# Patient Record
Sex: Male | Born: 1950 | Race: Black or African American | Hispanic: No | Marital: Married | Smoking: Former smoker
Health system: Southern US, Community
[De-identification: ages and names within clinical notes are randomized; demographics above are authoritative.]

## PROBLEM LIST (undated history)

## (undated) DIAGNOSIS — G959 Disease of spinal cord, unspecified: Secondary | ICD-10-CM

## (undated) DIAGNOSIS — I2510 Atherosclerotic heart disease of native coronary artery without angina pectoris: Secondary | ICD-10-CM

## (undated) DIAGNOSIS — M1990 Unspecified osteoarthritis, unspecified site: Secondary | ICD-10-CM

## (undated) DIAGNOSIS — E785 Hyperlipidemia, unspecified: Secondary | ICD-10-CM

## (undated) DIAGNOSIS — I10 Essential (primary) hypertension: Secondary | ICD-10-CM

## (undated) DIAGNOSIS — R001 Bradycardia, unspecified: Secondary | ICD-10-CM

## (undated) HISTORY — DX: Essential (primary) hypertension: I10

## (undated) HISTORY — PX: APPENDECTOMY: SHX54

## (undated) HISTORY — PX: ROTATOR CUFF REPAIR: SHX139

## (undated) HISTORY — DX: Hyperlipidemia, unspecified: E78.5

## (undated) HISTORY — DX: Unspecified osteoarthritis, unspecified site: M19.90

---

## 2015-01-04 ENCOUNTER — Ambulatory Visit (INDEPENDENT_AMBULATORY_CARE_PROVIDER_SITE_OTHER): Payer: Medicare Other | Admitting: Family Medicine

## 2015-01-04 ENCOUNTER — Ambulatory Visit (INDEPENDENT_AMBULATORY_CARE_PROVIDER_SITE_OTHER): Payer: Medicare Other

## 2015-01-04 VITALS — BP 120/76 | HR 56 | Temp 98.3°F | Resp 14 | Ht 71.0 in | Wt 190.0 lb

## 2015-01-04 DIAGNOSIS — R05 Cough: Secondary | ICD-10-CM

## 2015-01-04 DIAGNOSIS — R059 Cough, unspecified: Secondary | ICD-10-CM

## 2015-01-04 NOTE — Progress Notes (Signed)
[facility] [address] [place] [address] [phone]  Date:  01/04/2015   Name:  Kevin Wade   DOB:  [birth year]   MRN:  [id number]  PCP:  Pcp Not In System    Chief Complaint: Cough   History of Present Illness:  Kevin Wade is a 64 y.o. very pleasant male patient who presents with the following: Cough:  - 1 month, getting better.  Not really bothering him, but wants to make sure everything is okay.  - Coughing up green sputum - Denies fevers/chills. No weight loss.   - Tried Airborn, theraflu, garlic - No allergies - No rhinorrea or other URI symptoms. Good appetitie.  - Not keeping him up at night - No sick contacts - Previous smoker: last smoked > 30 yrs ago, ppd x 20, 20 pk years   There are no active problems to display for this patient.   Past Medical History  Diagnosis Date  . Arthritis   . Hypertension   . Hyperlipidemia     Past Surgical History  Procedure Laterality Date  . Appendectomy      Social History  Substance Use Topics  . Smoking status: Never Smoker   . Smokeless tobacco: None  . Alcohol Use: No    Family History  Problem Relation Age of Onset  . Hypertension Father     No Known Allergies  Medication list has been reviewed and updated.  No current outpatient prescriptions on file prior to visit.   No current facility-administered medications on file prior to visit.    Review of Systems: Review of Systems  Constitutional: Negative for fever, chills, weight loss and malaise/fatigue.  HENT: Negative for congestion, ear pain and nosebleeds.   Eyes: Negative for blurred vision.  Respiratory: Positive for cough (wet). Negative for hemoptysis, sputum production, shortness of breath, wheezing and stridor.   Cardiovascular: Negative for chest pain and palpitations.  Gastrointestinal: Negative for heartburn, nausea, vomiting, abdominal pain, diarrhea and constipation.  Musculoskeletal: Negative for  myalgias.  Neurological: Negative for dizziness, focal weakness, weakness and headaches.  Endo/Heme/Allergies: Negative for environmental allergies.     Physical Examination: Filed Vitals:   01/04/15 1638  BP: 120/76  Pulse: 56  Temp: 98.3 F (36.8 C)  Resp: 14   Filed Vitals:   01/04/15 1638  Height:  (1.803 m)  Weight: 190 lb (86.183 kg)   Body mass index is 26.51 kg/(m^2). Ideal Body Weight: Weight in (lb) to have BMI = 25: 178.9  GEN: WDWN, NAD, Non-toxic, A & O x 3 HEENT: Atraumatic, Normocephalic. Neck supple. No masses, No LAD. Ears and Nose: No external deformity. CV: RRR, No M/G/R. No JVD. No thrill. No extra heart sounds. PULM: CTA B, no wheezes, crackles, rhonchi. No retractions. No resp. distress. No accessory muscle use. ABD: S, NT, ND, +BS. No rebound. No HSM. EXTR: No c/c/e NEURO Normal gait.  PSYCH: Normally interactive. Conversant. Not depressed or anxious appearing.  Calm demeanor.   [facility] reading (PRIMARY) by  Dr. Neva Seat and Mayford Knife: CXR: increased perihilar markings, otherwise unremarkable.     Assessment and Plan: 1 month history of mild, improving cough in a 64 yo former smoker (20pckyr, <30 years ago).  No systemic symptoms.  CXR negative other then hilar LAD.  Symptoms are not bothersome.  Will f/u in 1 month if cough still present.    Signed Guinevere Scarlet, MD

## 2015-01-04 NOTE — Patient Instructions (Signed)
Cough, Adult  A cough is a reflex that helps clear your throat and airways. It can help heal the body or may be a reaction to an irritated airway. A cough may only last 2 or 3 weeks (acute) or may last more than 8 weeks (chronic).  CAUSES Acute cough:  Viral or bacterial infections. Chronic cough:  Infections.  Allergies.  Asthma.  Post-nasal drip.  Smoking.  Heartburn or acid reflux.  Some medicines.  Chronic lung problems (COPD).  Cancer. SYMPTOMS   Cough.  Fever.  Chest pain.  Increased breathing rate.  High-pitched whistling sound when breathing (wheezing).  Colored mucus that you cough up (sputum). TREATMENT   A bacterial cough may be treated with antibiotic medicine.  A viral cough must run its course and will not respond to antibiotics.  Your caregiver may recommend other treatments if you have a chronic cough. HOME CARE INSTRUCTIONS   Only take over-the-counter or prescription medicines for pain, discomfort, or fever as directed by your caregiver. Use cough suppressants only as directed by your caregiver.  Use a cold steam vaporizer or humidifier in your bedroom or home to help loosen secretions.  Sleep in a semi-upright position if your cough is worse at night.  Rest as needed.  Stop smoking if you smoke. SEEK IMMEDIATE MEDICAL CARE IF:   You have pus in your sputum.  Your cough starts to worsen.  You cannot control your cough with suppressants and are losing sleep.  You begin coughing up blood.  You have difficulty breathing.  You develop pain which is getting worse or is uncontrolled with medicine.  You have a fever. MAKE SURE YOU:   Understand these instructions.  Will watch your condition.  Will get help right away if you are not doing well or get worse. Document Released: 10/27/2010 Document Revised: 07/23/2011 Document Reviewed: 10/27/2010 ExitCare Patient Information 2015 [employer]. This information is not intended  to replace advice given to you by your health care provider. Make sure you discuss any questions you have with your health care provider.  

## 2015-01-04 NOTE — Progress Notes (Signed)
Xray read and patient discussed with Dr. Williams. Agree with assessment and plan of care per his note.   

## 2015-12-02 DIAGNOSIS — M7062 Trochanteric bursitis, left hip: Secondary | ICD-10-CM | POA: Diagnosis not present

## 2015-12-06 DIAGNOSIS — M25552 Pain in left hip: Secondary | ICD-10-CM | POA: Diagnosis not present

## 2015-12-06 DIAGNOSIS — M79652 Pain in left thigh: Secondary | ICD-10-CM | POA: Diagnosis not present

## 2015-12-09 DIAGNOSIS — M5416 Radiculopathy, lumbar region: Secondary | ICD-10-CM | POA: Diagnosis not present

## 2015-12-19 DIAGNOSIS — M85511 Aneurysmal bone cyst, right shoulder: Secondary | ICD-10-CM | POA: Diagnosis not present

## 2015-12-19 DIAGNOSIS — M5416 Radiculopathy, lumbar region: Secondary | ICD-10-CM | POA: Diagnosis not present

## 2015-12-21 DIAGNOSIS — M5416 Radiculopathy, lumbar region: Secondary | ICD-10-CM | POA: Diagnosis not present

## 2015-12-27 DIAGNOSIS — M5416 Radiculopathy, lumbar region: Secondary | ICD-10-CM | POA: Diagnosis not present

## 2016-01-09 DIAGNOSIS — M25511 Pain in right shoulder: Secondary | ICD-10-CM | POA: Diagnosis not present

## 2016-01-09 DIAGNOSIS — M5416 Radiculopathy, lumbar region: Secondary | ICD-10-CM | POA: Diagnosis not present

## 2016-01-13 DIAGNOSIS — M5416 Radiculopathy, lumbar region: Secondary | ICD-10-CM | POA: Diagnosis not present

## 2016-01-14 DIAGNOSIS — M25511 Pain in right shoulder: Secondary | ICD-10-CM | POA: Diagnosis not present

## 2016-01-18 DIAGNOSIS — M25511 Pain in right shoulder: Secondary | ICD-10-CM | POA: Diagnosis not present

## 2016-01-20 DIAGNOSIS — M7521 Bicipital tendinitis, right shoulder: Secondary | ICD-10-CM | POA: Diagnosis not present

## 2016-01-24 DIAGNOSIS — M7541 Impingement syndrome of right shoulder: Secondary | ICD-10-CM | POA: Diagnosis not present

## 2016-01-24 DIAGNOSIS — M75121 Complete rotator cuff tear or rupture of right shoulder, not specified as traumatic: Secondary | ICD-10-CM | POA: Diagnosis not present

## 2016-01-24 DIAGNOSIS — G8918 Other acute postprocedural pain: Secondary | ICD-10-CM | POA: Diagnosis not present

## 2016-01-24 DIAGNOSIS — M7521 Bicipital tendinitis, right shoulder: Secondary | ICD-10-CM | POA: Diagnosis not present

## 2016-02-01 DIAGNOSIS — Z9889 Other specified postprocedural states: Secondary | ICD-10-CM | POA: Diagnosis not present

## 2016-02-10 DIAGNOSIS — Z9889 Other specified postprocedural states: Secondary | ICD-10-CM | POA: Diagnosis not present

## 2016-03-07 DIAGNOSIS — Z9889 Other specified postprocedural states: Secondary | ICD-10-CM | POA: Diagnosis not present

## 2016-03-12 DIAGNOSIS — M25611 Stiffness of right shoulder, not elsewhere classified: Secondary | ICD-10-CM | POA: Diagnosis not present

## 2016-03-12 DIAGNOSIS — M25511 Pain in right shoulder: Secondary | ICD-10-CM | POA: Diagnosis not present

## 2016-03-12 DIAGNOSIS — M75121 Complete rotator cuff tear or rupture of right shoulder, not specified as traumatic: Secondary | ICD-10-CM | POA: Diagnosis not present

## 2016-03-13 DIAGNOSIS — I10 Essential (primary) hypertension: Secondary | ICD-10-CM | POA: Diagnosis not present

## 2016-03-13 DIAGNOSIS — Z9889 Other specified postprocedural states: Secondary | ICD-10-CM | POA: Insufficient documentation

## 2016-03-13 DIAGNOSIS — E785 Hyperlipidemia, unspecified: Secondary | ICD-10-CM | POA: Diagnosis not present

## 2016-03-13 DIAGNOSIS — Z23 Encounter for immunization: Secondary | ICD-10-CM | POA: Diagnosis not present

## 2016-03-14 DIAGNOSIS — M25511 Pain in right shoulder: Secondary | ICD-10-CM | POA: Diagnosis not present

## 2016-03-14 DIAGNOSIS — M75121 Complete rotator cuff tear or rupture of right shoulder, not specified as traumatic: Secondary | ICD-10-CM | POA: Diagnosis not present

## 2016-03-14 DIAGNOSIS — M25611 Stiffness of right shoulder, not elsewhere classified: Secondary | ICD-10-CM | POA: Diagnosis not present

## 2016-03-16 DIAGNOSIS — M25611 Stiffness of right shoulder, not elsewhere classified: Secondary | ICD-10-CM | POA: Diagnosis not present

## 2016-03-16 DIAGNOSIS — M75121 Complete rotator cuff tear or rupture of right shoulder, not specified as traumatic: Secondary | ICD-10-CM | POA: Diagnosis not present

## 2016-03-16 DIAGNOSIS — M25511 Pain in right shoulder: Secondary | ICD-10-CM | POA: Diagnosis not present

## 2016-03-19 DIAGNOSIS — M75121 Complete rotator cuff tear or rupture of right shoulder, not specified as traumatic: Secondary | ICD-10-CM | POA: Diagnosis not present

## 2016-03-19 DIAGNOSIS — M25611 Stiffness of right shoulder, not elsewhere classified: Secondary | ICD-10-CM | POA: Diagnosis not present

## 2016-03-19 DIAGNOSIS — M25511 Pain in right shoulder: Secondary | ICD-10-CM | POA: Diagnosis not present

## 2016-03-21 DIAGNOSIS — M25511 Pain in right shoulder: Secondary | ICD-10-CM | POA: Diagnosis not present

## 2016-03-21 DIAGNOSIS — M75121 Complete rotator cuff tear or rupture of right shoulder, not specified as traumatic: Secondary | ICD-10-CM | POA: Diagnosis not present

## 2016-03-21 DIAGNOSIS — M25611 Stiffness of right shoulder, not elsewhere classified: Secondary | ICD-10-CM | POA: Diagnosis not present

## 2016-03-23 DIAGNOSIS — M25511 Pain in right shoulder: Secondary | ICD-10-CM | POA: Diagnosis not present

## 2016-03-23 DIAGNOSIS — M75121 Complete rotator cuff tear or rupture of right shoulder, not specified as traumatic: Secondary | ICD-10-CM | POA: Diagnosis not present

## 2016-03-23 DIAGNOSIS — M25611 Stiffness of right shoulder, not elsewhere classified: Secondary | ICD-10-CM | POA: Diagnosis not present

## 2016-03-26 DIAGNOSIS — M75121 Complete rotator cuff tear or rupture of right shoulder, not specified as traumatic: Secondary | ICD-10-CM | POA: Diagnosis not present

## 2016-03-26 DIAGNOSIS — M25511 Pain in right shoulder: Secondary | ICD-10-CM | POA: Diagnosis not present

## 2016-03-26 DIAGNOSIS — M25611 Stiffness of right shoulder, not elsewhere classified: Secondary | ICD-10-CM | POA: Diagnosis not present

## 2016-03-28 DIAGNOSIS — M25511 Pain in right shoulder: Secondary | ICD-10-CM | POA: Diagnosis not present

## 2016-03-28 DIAGNOSIS — M75121 Complete rotator cuff tear or rupture of right shoulder, not specified as traumatic: Secondary | ICD-10-CM | POA: Diagnosis not present

## 2016-03-28 DIAGNOSIS — M25611 Stiffness of right shoulder, not elsewhere classified: Secondary | ICD-10-CM | POA: Diagnosis not present

## 2016-04-02 DIAGNOSIS — M25511 Pain in right shoulder: Secondary | ICD-10-CM | POA: Diagnosis not present

## 2016-04-02 DIAGNOSIS — M75121 Complete rotator cuff tear or rupture of right shoulder, not specified as traumatic: Secondary | ICD-10-CM | POA: Diagnosis not present

## 2016-04-02 DIAGNOSIS — M25611 Stiffness of right shoulder, not elsewhere classified: Secondary | ICD-10-CM | POA: Diagnosis not present

## 2016-04-03 DIAGNOSIS — M25511 Pain in right shoulder: Secondary | ICD-10-CM | POA: Diagnosis not present

## 2016-04-03 DIAGNOSIS — M25611 Stiffness of right shoulder, not elsewhere classified: Secondary | ICD-10-CM | POA: Diagnosis not present

## 2016-04-03 DIAGNOSIS — M75121 Complete rotator cuff tear or rupture of right shoulder, not specified as traumatic: Secondary | ICD-10-CM | POA: Diagnosis not present

## 2016-04-10 DIAGNOSIS — Z0000 Encounter for general adult medical examination without abnormal findings: Secondary | ICD-10-CM | POA: Diagnosis not present

## 2016-04-11 DIAGNOSIS — M25611 Stiffness of right shoulder, not elsewhere classified: Secondary | ICD-10-CM | POA: Diagnosis not present

## 2016-04-11 DIAGNOSIS — M25511 Pain in right shoulder: Secondary | ICD-10-CM | POA: Diagnosis not present

## 2016-04-11 DIAGNOSIS — M75121 Complete rotator cuff tear or rupture of right shoulder, not specified as traumatic: Secondary | ICD-10-CM | POA: Diagnosis not present

## 2016-04-13 DIAGNOSIS — M75121 Complete rotator cuff tear or rupture of right shoulder, not specified as traumatic: Secondary | ICD-10-CM | POA: Diagnosis not present

## 2016-04-13 DIAGNOSIS — M25511 Pain in right shoulder: Secondary | ICD-10-CM | POA: Diagnosis not present

## 2016-04-13 DIAGNOSIS — M25611 Stiffness of right shoulder, not elsewhere classified: Secondary | ICD-10-CM | POA: Diagnosis not present

## 2016-04-16 DIAGNOSIS — M75121 Complete rotator cuff tear or rupture of right shoulder, not specified as traumatic: Secondary | ICD-10-CM | POA: Diagnosis not present

## 2016-04-16 DIAGNOSIS — M25611 Stiffness of right shoulder, not elsewhere classified: Secondary | ICD-10-CM | POA: Diagnosis not present

## 2016-04-16 DIAGNOSIS — M25511 Pain in right shoulder: Secondary | ICD-10-CM | POA: Diagnosis not present

## 2016-04-18 DIAGNOSIS — Z9889 Other specified postprocedural states: Secondary | ICD-10-CM | POA: Diagnosis not present

## 2016-04-19 DIAGNOSIS — M25511 Pain in right shoulder: Secondary | ICD-10-CM | POA: Diagnosis not present

## 2016-04-19 DIAGNOSIS — M25611 Stiffness of right shoulder, not elsewhere classified: Secondary | ICD-10-CM | POA: Diagnosis not present

## 2016-04-19 DIAGNOSIS — M75121 Complete rotator cuff tear or rupture of right shoulder, not specified as traumatic: Secondary | ICD-10-CM | POA: Diagnosis not present

## 2016-04-24 DIAGNOSIS — M75121 Complete rotator cuff tear or rupture of right shoulder, not specified as traumatic: Secondary | ICD-10-CM | POA: Diagnosis not present

## 2016-04-24 DIAGNOSIS — M25611 Stiffness of right shoulder, not elsewhere classified: Secondary | ICD-10-CM | POA: Diagnosis not present

## 2016-04-24 DIAGNOSIS — M25511 Pain in right shoulder: Secondary | ICD-10-CM | POA: Diagnosis not present

## 2016-04-27 DIAGNOSIS — M25611 Stiffness of right shoulder, not elsewhere classified: Secondary | ICD-10-CM | POA: Diagnosis not present

## 2016-04-27 DIAGNOSIS — M75121 Complete rotator cuff tear or rupture of right shoulder, not specified as traumatic: Secondary | ICD-10-CM | POA: Diagnosis not present

## 2016-04-27 DIAGNOSIS — M25511 Pain in right shoulder: Secondary | ICD-10-CM | POA: Diagnosis not present

## 2016-04-30 DIAGNOSIS — M25511 Pain in right shoulder: Secondary | ICD-10-CM | POA: Diagnosis not present

## 2016-04-30 DIAGNOSIS — M25611 Stiffness of right shoulder, not elsewhere classified: Secondary | ICD-10-CM | POA: Diagnosis not present

## 2016-04-30 DIAGNOSIS — M75121 Complete rotator cuff tear or rupture of right shoulder, not specified as traumatic: Secondary | ICD-10-CM | POA: Diagnosis not present

## 2016-05-02 DIAGNOSIS — M25511 Pain in right shoulder: Secondary | ICD-10-CM | POA: Diagnosis not present

## 2016-05-02 DIAGNOSIS — M75121 Complete rotator cuff tear or rupture of right shoulder, not specified as traumatic: Secondary | ICD-10-CM | POA: Diagnosis not present

## 2016-05-02 DIAGNOSIS — M25611 Stiffness of right shoulder, not elsewhere classified: Secondary | ICD-10-CM | POA: Diagnosis not present

## 2016-05-15 DIAGNOSIS — M25611 Stiffness of right shoulder, not elsewhere classified: Secondary | ICD-10-CM | POA: Diagnosis not present

## 2016-05-15 DIAGNOSIS — M25511 Pain in right shoulder: Secondary | ICD-10-CM | POA: Diagnosis not present

## 2016-05-15 DIAGNOSIS — M75121 Complete rotator cuff tear or rupture of right shoulder, not specified as traumatic: Secondary | ICD-10-CM | POA: Diagnosis not present

## 2016-05-17 DIAGNOSIS — M25611 Stiffness of right shoulder, not elsewhere classified: Secondary | ICD-10-CM | POA: Diagnosis not present

## 2016-05-17 DIAGNOSIS — M25511 Pain in right shoulder: Secondary | ICD-10-CM | POA: Diagnosis not present

## 2016-05-17 DIAGNOSIS — M75121 Complete rotator cuff tear or rupture of right shoulder, not specified as traumatic: Secondary | ICD-10-CM | POA: Diagnosis not present

## 2016-05-21 DIAGNOSIS — M25611 Stiffness of right shoulder, not elsewhere classified: Secondary | ICD-10-CM | POA: Diagnosis not present

## 2016-05-21 DIAGNOSIS — M75121 Complete rotator cuff tear or rupture of right shoulder, not specified as traumatic: Secondary | ICD-10-CM | POA: Diagnosis not present

## 2016-05-21 DIAGNOSIS — M25511 Pain in right shoulder: Secondary | ICD-10-CM | POA: Diagnosis not present

## 2016-05-23 DIAGNOSIS — M25511 Pain in right shoulder: Secondary | ICD-10-CM | POA: Diagnosis not present

## 2016-05-23 DIAGNOSIS — M25611 Stiffness of right shoulder, not elsewhere classified: Secondary | ICD-10-CM | POA: Diagnosis not present

## 2016-05-23 DIAGNOSIS — M75121 Complete rotator cuff tear or rupture of right shoulder, not specified as traumatic: Secondary | ICD-10-CM | POA: Diagnosis not present

## 2016-05-28 DIAGNOSIS — M75121 Complete rotator cuff tear or rupture of right shoulder, not specified as traumatic: Secondary | ICD-10-CM | POA: Diagnosis not present

## 2016-05-28 DIAGNOSIS — M25611 Stiffness of right shoulder, not elsewhere classified: Secondary | ICD-10-CM | POA: Diagnosis not present

## 2016-05-28 DIAGNOSIS — M25511 Pain in right shoulder: Secondary | ICD-10-CM | POA: Diagnosis not present

## 2016-06-01 DIAGNOSIS — Z09 Encounter for follow-up examination after completed treatment for conditions other than malignant neoplasm: Secondary | ICD-10-CM | POA: Diagnosis not present

## 2016-06-04 DIAGNOSIS — M75121 Complete rotator cuff tear or rupture of right shoulder, not specified as traumatic: Secondary | ICD-10-CM | POA: Diagnosis not present

## 2016-06-04 DIAGNOSIS — M25511 Pain in right shoulder: Secondary | ICD-10-CM | POA: Diagnosis not present

## 2016-06-04 DIAGNOSIS — M25611 Stiffness of right shoulder, not elsewhere classified: Secondary | ICD-10-CM | POA: Diagnosis not present

## 2016-06-06 DIAGNOSIS — M25511 Pain in right shoulder: Secondary | ICD-10-CM | POA: Diagnosis not present

## 2016-06-06 DIAGNOSIS — M25611 Stiffness of right shoulder, not elsewhere classified: Secondary | ICD-10-CM | POA: Diagnosis not present

## 2016-06-06 DIAGNOSIS — M75121 Complete rotator cuff tear or rupture of right shoulder, not specified as traumatic: Secondary | ICD-10-CM | POA: Diagnosis not present

## 2016-06-11 DIAGNOSIS — M75121 Complete rotator cuff tear or rupture of right shoulder, not specified as traumatic: Secondary | ICD-10-CM | POA: Diagnosis not present

## 2016-06-11 DIAGNOSIS — M25511 Pain in right shoulder: Secondary | ICD-10-CM | POA: Diagnosis not present

## 2016-06-11 DIAGNOSIS — I10 Essential (primary) hypertension: Secondary | ICD-10-CM | POA: Diagnosis not present

## 2016-06-11 DIAGNOSIS — Z1159 Encounter for screening for other viral diseases: Secondary | ICD-10-CM | POA: Diagnosis not present

## 2016-06-11 DIAGNOSIS — Z0000 Encounter for general adult medical examination without abnormal findings: Secondary | ICD-10-CM | POA: Diagnosis not present

## 2016-06-11 DIAGNOSIS — M25611 Stiffness of right shoulder, not elsewhere classified: Secondary | ICD-10-CM | POA: Diagnosis not present

## 2016-06-11 DIAGNOSIS — E785 Hyperlipidemia, unspecified: Secondary | ICD-10-CM | POA: Diagnosis not present

## 2016-06-13 DIAGNOSIS — I10 Essential (primary) hypertension: Secondary | ICD-10-CM | POA: Diagnosis not present

## 2016-06-13 DIAGNOSIS — H6121 Impacted cerumen, right ear: Secondary | ICD-10-CM | POA: Diagnosis not present

## 2016-06-13 DIAGNOSIS — M25611 Stiffness of right shoulder, not elsewhere classified: Secondary | ICD-10-CM | POA: Diagnosis not present

## 2016-06-13 DIAGNOSIS — M75121 Complete rotator cuff tear or rupture of right shoulder, not specified as traumatic: Secondary | ICD-10-CM | POA: Diagnosis not present

## 2016-06-13 DIAGNOSIS — Z1211 Encounter for screening for malignant neoplasm of colon: Secondary | ICD-10-CM | POA: Diagnosis not present

## 2016-06-13 DIAGNOSIS — M25511 Pain in right shoulder: Secondary | ICD-10-CM | POA: Diagnosis not present

## 2016-06-13 DIAGNOSIS — Z23 Encounter for immunization: Secondary | ICD-10-CM | POA: Diagnosis not present

## 2016-06-13 DIAGNOSIS — E785 Hyperlipidemia, unspecified: Secondary | ICD-10-CM | POA: Diagnosis not present

## 2016-06-15 DIAGNOSIS — K5900 Constipation, unspecified: Secondary | ICD-10-CM | POA: Diagnosis not present

## 2016-06-15 DIAGNOSIS — Z1211 Encounter for screening for malignant neoplasm of colon: Secondary | ICD-10-CM | POA: Diagnosis not present

## 2016-06-15 DIAGNOSIS — Z86010 Personal history of colonic polyps: Secondary | ICD-10-CM | POA: Diagnosis not present

## 2016-06-18 DIAGNOSIS — M75121 Complete rotator cuff tear or rupture of right shoulder, not specified as traumatic: Secondary | ICD-10-CM | POA: Diagnosis not present

## 2016-06-18 DIAGNOSIS — M25611 Stiffness of right shoulder, not elsewhere classified: Secondary | ICD-10-CM | POA: Diagnosis not present

## 2016-06-18 DIAGNOSIS — M25511 Pain in right shoulder: Secondary | ICD-10-CM | POA: Diagnosis not present

## 2016-06-20 DIAGNOSIS — M25611 Stiffness of right shoulder, not elsewhere classified: Secondary | ICD-10-CM | POA: Diagnosis not present

## 2016-06-20 DIAGNOSIS — M25511 Pain in right shoulder: Secondary | ICD-10-CM | POA: Diagnosis not present

## 2016-06-20 DIAGNOSIS — M75121 Complete rotator cuff tear or rupture of right shoulder, not specified as traumatic: Secondary | ICD-10-CM | POA: Diagnosis not present

## 2016-06-25 DIAGNOSIS — M25511 Pain in right shoulder: Secondary | ICD-10-CM | POA: Diagnosis not present

## 2016-06-25 DIAGNOSIS — M25611 Stiffness of right shoulder, not elsewhere classified: Secondary | ICD-10-CM | POA: Diagnosis not present

## 2016-06-25 DIAGNOSIS — M75121 Complete rotator cuff tear or rupture of right shoulder, not specified as traumatic: Secondary | ICD-10-CM | POA: Diagnosis not present

## 2016-06-27 DIAGNOSIS — M75121 Complete rotator cuff tear or rupture of right shoulder, not specified as traumatic: Secondary | ICD-10-CM | POA: Diagnosis not present

## 2016-06-27 DIAGNOSIS — M25511 Pain in right shoulder: Secondary | ICD-10-CM | POA: Diagnosis not present

## 2016-06-27 DIAGNOSIS — M25611 Stiffness of right shoulder, not elsewhere classified: Secondary | ICD-10-CM | POA: Diagnosis not present

## 2016-07-04 DIAGNOSIS — M25511 Pain in right shoulder: Secondary | ICD-10-CM | POA: Diagnosis not present

## 2016-07-04 DIAGNOSIS — M25611 Stiffness of right shoulder, not elsewhere classified: Secondary | ICD-10-CM | POA: Diagnosis not present

## 2016-07-04 DIAGNOSIS — M75121 Complete rotator cuff tear or rupture of right shoulder, not specified as traumatic: Secondary | ICD-10-CM | POA: Diagnosis not present

## 2016-07-09 DIAGNOSIS — M25611 Stiffness of right shoulder, not elsewhere classified: Secondary | ICD-10-CM | POA: Diagnosis not present

## 2016-07-09 DIAGNOSIS — M25511 Pain in right shoulder: Secondary | ICD-10-CM | POA: Diagnosis not present

## 2016-07-09 DIAGNOSIS — M75121 Complete rotator cuff tear or rupture of right shoulder, not specified as traumatic: Secondary | ICD-10-CM | POA: Diagnosis not present

## 2016-07-12 DIAGNOSIS — M25611 Stiffness of right shoulder, not elsewhere classified: Secondary | ICD-10-CM | POA: Diagnosis not present

## 2016-07-12 DIAGNOSIS — M75121 Complete rotator cuff tear or rupture of right shoulder, not specified as traumatic: Secondary | ICD-10-CM | POA: Diagnosis not present

## 2016-07-12 DIAGNOSIS — M25511 Pain in right shoulder: Secondary | ICD-10-CM | POA: Diagnosis not present

## 2016-07-13 DIAGNOSIS — Z09 Encounter for follow-up examination after completed treatment for conditions other than malignant neoplasm: Secondary | ICD-10-CM | POA: Diagnosis not present

## 2016-07-16 DIAGNOSIS — M25511 Pain in right shoulder: Secondary | ICD-10-CM | POA: Diagnosis not present

## 2016-07-16 DIAGNOSIS — M25611 Stiffness of right shoulder, not elsewhere classified: Secondary | ICD-10-CM | POA: Diagnosis not present

## 2016-07-16 DIAGNOSIS — M75121 Complete rotator cuff tear or rupture of right shoulder, not specified as traumatic: Secondary | ICD-10-CM | POA: Diagnosis not present

## 2016-07-25 DIAGNOSIS — M75121 Complete rotator cuff tear or rupture of right shoulder, not specified as traumatic: Secondary | ICD-10-CM | POA: Diagnosis not present

## 2016-07-25 DIAGNOSIS — M25511 Pain in right shoulder: Secondary | ICD-10-CM | POA: Diagnosis not present

## 2016-07-25 DIAGNOSIS — M25611 Stiffness of right shoulder, not elsewhere classified: Secondary | ICD-10-CM | POA: Diagnosis not present

## 2016-07-31 DIAGNOSIS — M25511 Pain in right shoulder: Secondary | ICD-10-CM | POA: Diagnosis not present

## 2016-07-31 DIAGNOSIS — M25611 Stiffness of right shoulder, not elsewhere classified: Secondary | ICD-10-CM | POA: Diagnosis not present

## 2016-07-31 DIAGNOSIS — M75121 Complete rotator cuff tear or rupture of right shoulder, not specified as traumatic: Secondary | ICD-10-CM | POA: Diagnosis not present

## 2016-08-07 DIAGNOSIS — M25611 Stiffness of right shoulder, not elsewhere classified: Secondary | ICD-10-CM | POA: Diagnosis not present

## 2016-08-07 DIAGNOSIS — M75121 Complete rotator cuff tear or rupture of right shoulder, not specified as traumatic: Secondary | ICD-10-CM | POA: Diagnosis not present

## 2016-08-07 DIAGNOSIS — M25511 Pain in right shoulder: Secondary | ICD-10-CM | POA: Diagnosis not present

## 2016-08-13 DIAGNOSIS — M25611 Stiffness of right shoulder, not elsewhere classified: Secondary | ICD-10-CM | POA: Diagnosis not present

## 2016-08-13 DIAGNOSIS — M25511 Pain in right shoulder: Secondary | ICD-10-CM | POA: Diagnosis not present

## 2016-08-13 DIAGNOSIS — M75121 Complete rotator cuff tear or rupture of right shoulder, not specified as traumatic: Secondary | ICD-10-CM | POA: Diagnosis not present

## 2016-08-15 DIAGNOSIS — M25611 Stiffness of right shoulder, not elsewhere classified: Secondary | ICD-10-CM | POA: Diagnosis not present

## 2016-08-15 DIAGNOSIS — M25511 Pain in right shoulder: Secondary | ICD-10-CM | POA: Diagnosis not present

## 2016-08-15 DIAGNOSIS — M75121 Complete rotator cuff tear or rupture of right shoulder, not specified as traumatic: Secondary | ICD-10-CM | POA: Diagnosis not present

## 2016-08-20 DIAGNOSIS — M75121 Complete rotator cuff tear or rupture of right shoulder, not specified as traumatic: Secondary | ICD-10-CM | POA: Diagnosis not present

## 2016-08-20 DIAGNOSIS — M25611 Stiffness of right shoulder, not elsewhere classified: Secondary | ICD-10-CM | POA: Diagnosis not present

## 2016-08-20 DIAGNOSIS — M25511 Pain in right shoulder: Secondary | ICD-10-CM | POA: Diagnosis not present

## 2016-08-22 DIAGNOSIS — M75121 Complete rotator cuff tear or rupture of right shoulder, not specified as traumatic: Secondary | ICD-10-CM | POA: Diagnosis not present

## 2016-08-22 DIAGNOSIS — M25511 Pain in right shoulder: Secondary | ICD-10-CM | POA: Diagnosis not present

## 2016-08-22 DIAGNOSIS — M25611 Stiffness of right shoulder, not elsewhere classified: Secondary | ICD-10-CM | POA: Diagnosis not present

## 2016-08-27 DIAGNOSIS — M25611 Stiffness of right shoulder, not elsewhere classified: Secondary | ICD-10-CM | POA: Diagnosis not present

## 2016-08-27 DIAGNOSIS — M25511 Pain in right shoulder: Secondary | ICD-10-CM | POA: Diagnosis not present

## 2016-08-27 DIAGNOSIS — M75121 Complete rotator cuff tear or rupture of right shoulder, not specified as traumatic: Secondary | ICD-10-CM | POA: Diagnosis not present

## 2016-08-29 DIAGNOSIS — M25611 Stiffness of right shoulder, not elsewhere classified: Secondary | ICD-10-CM | POA: Diagnosis not present

## 2016-08-29 DIAGNOSIS — M75121 Complete rotator cuff tear or rupture of right shoulder, not specified as traumatic: Secondary | ICD-10-CM | POA: Diagnosis not present

## 2016-08-29 DIAGNOSIS — M25511 Pain in right shoulder: Secondary | ICD-10-CM | POA: Diagnosis not present

## 2016-09-30 IMAGING — CR DG CHEST 2V
3 series · 3 of 3 positions shown · non-contrast
Comparison: None.

CLINICAL DATA: Cough.

EXAM:
CHEST  2 VIEW

[PA (1 of 2)]
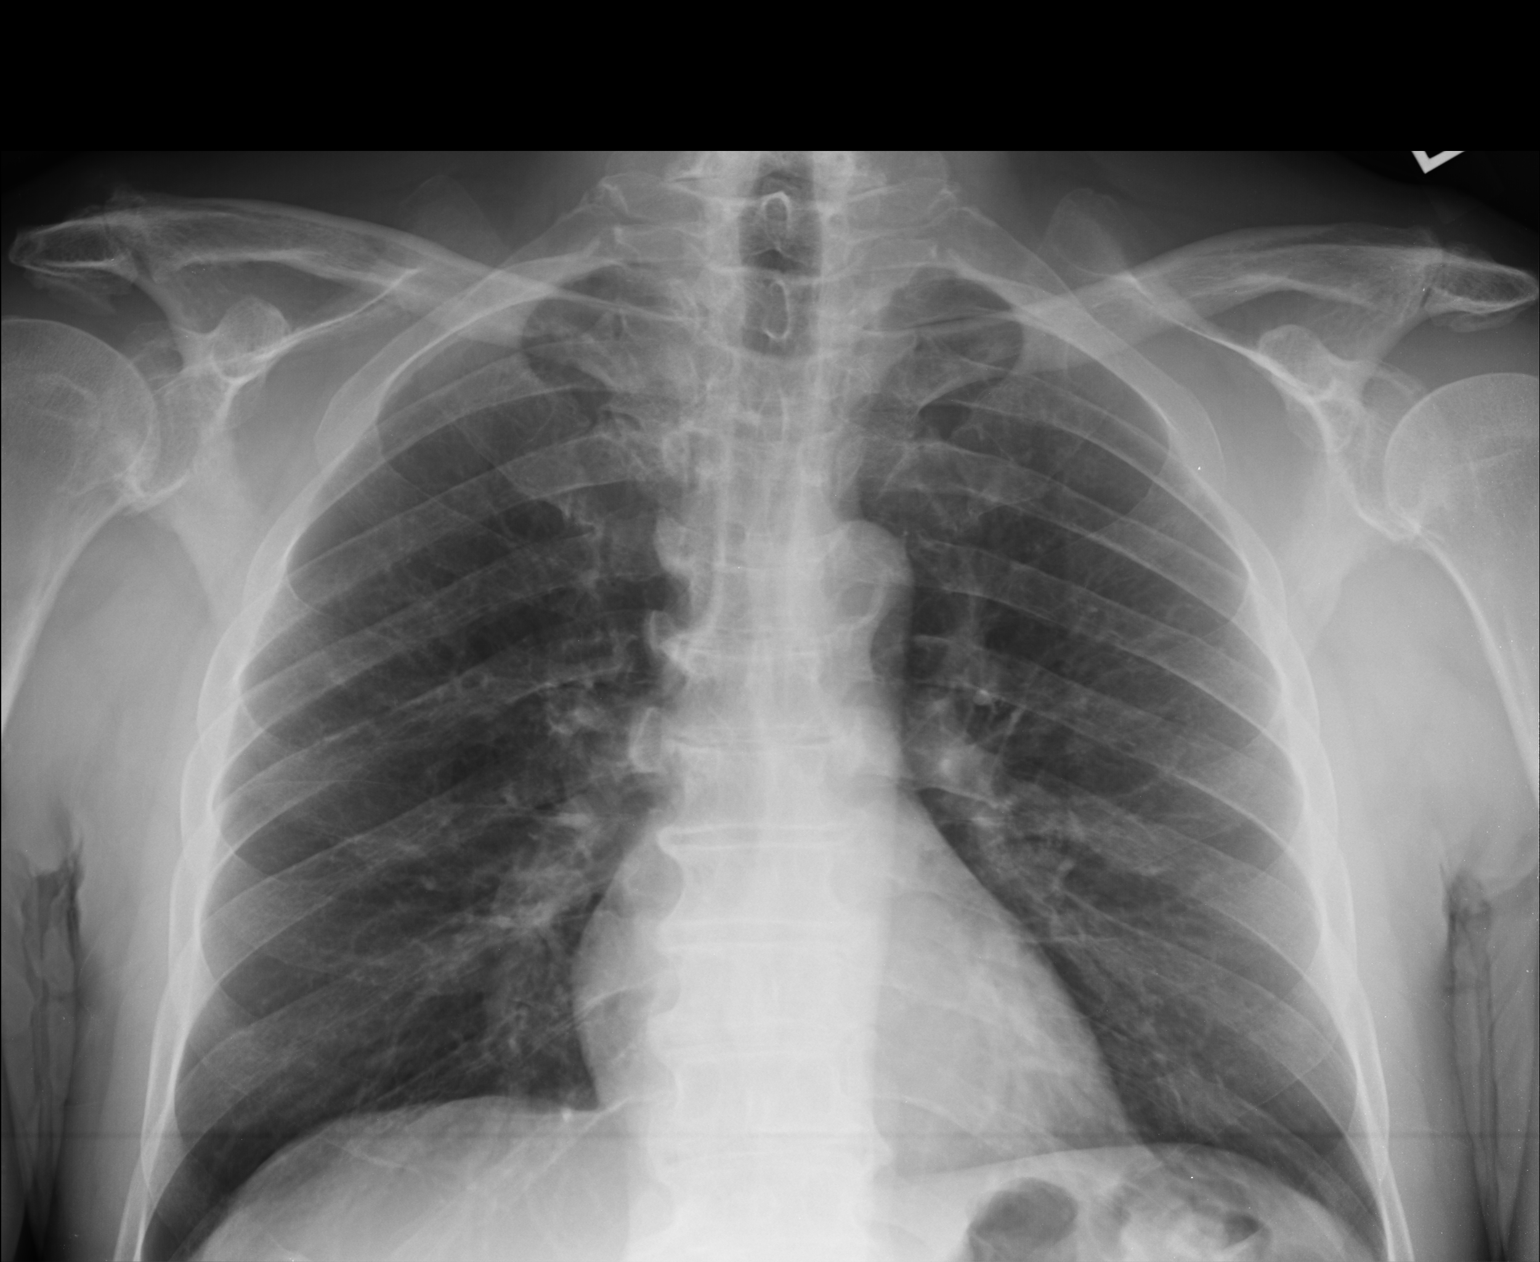

[lateral]
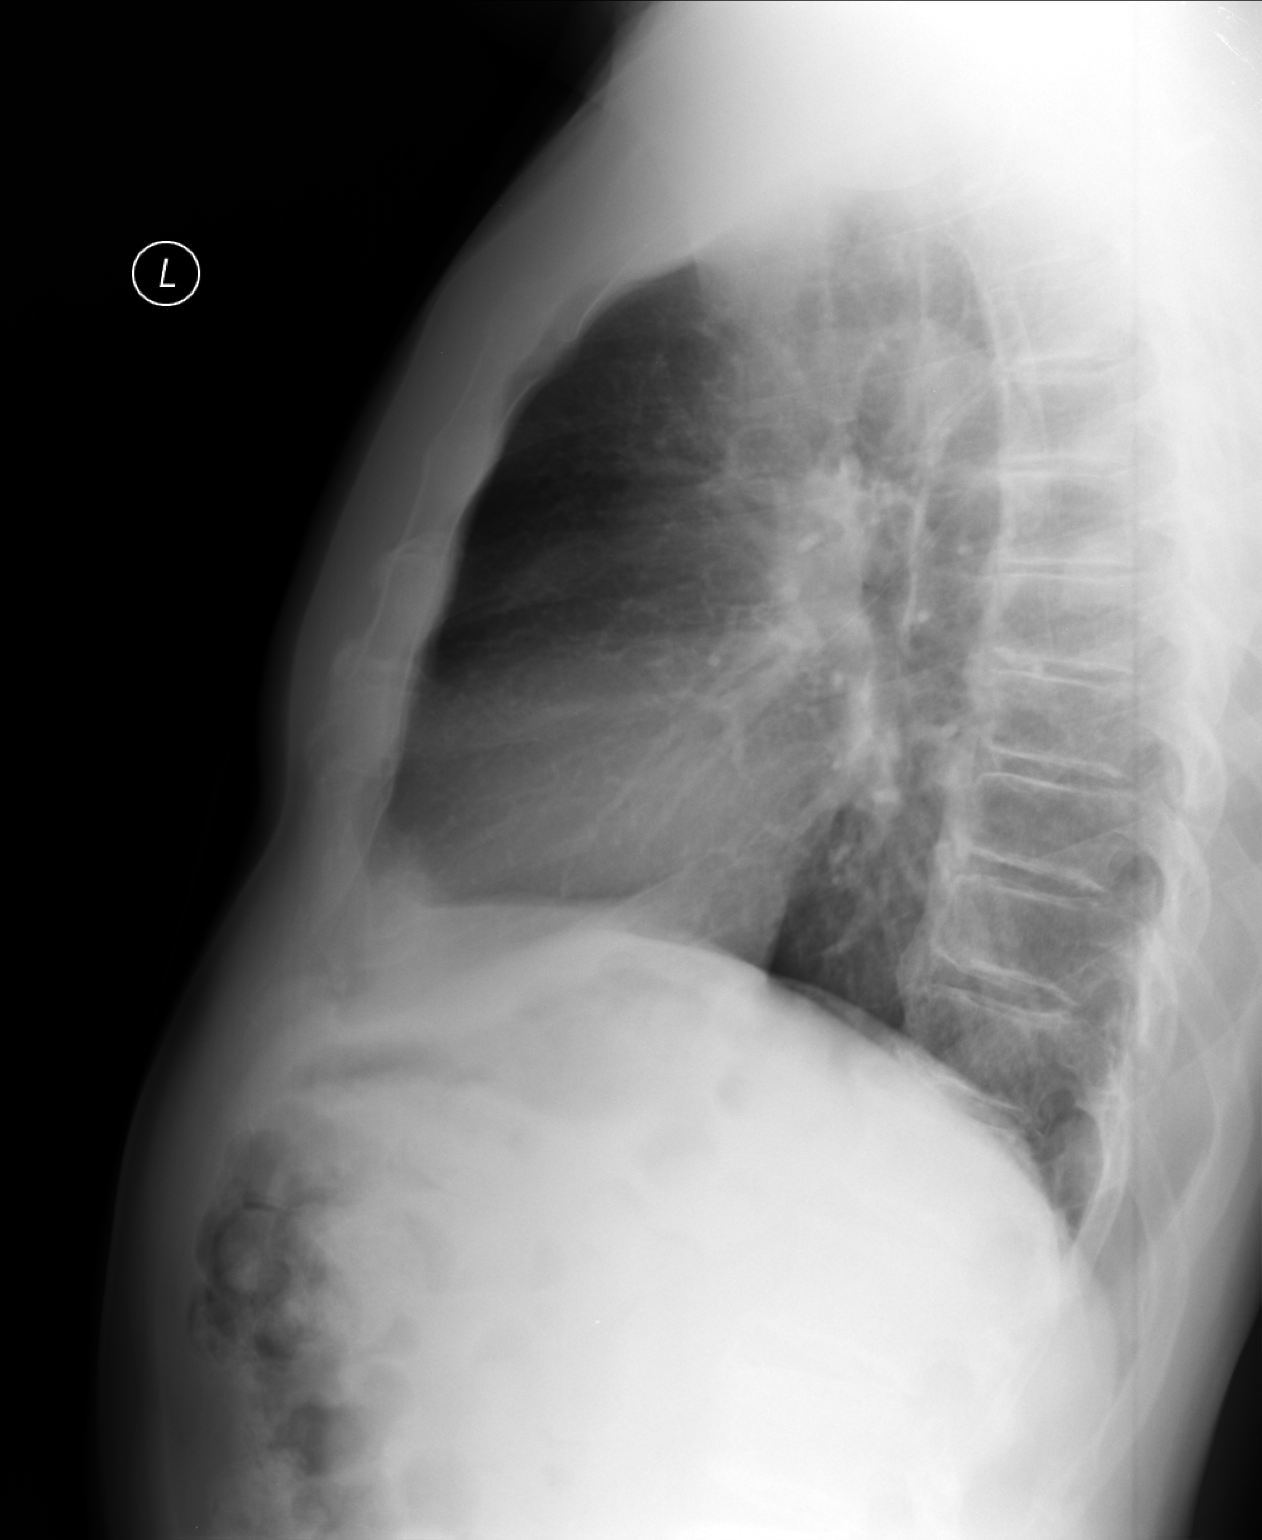

[PA (2 of 2)]
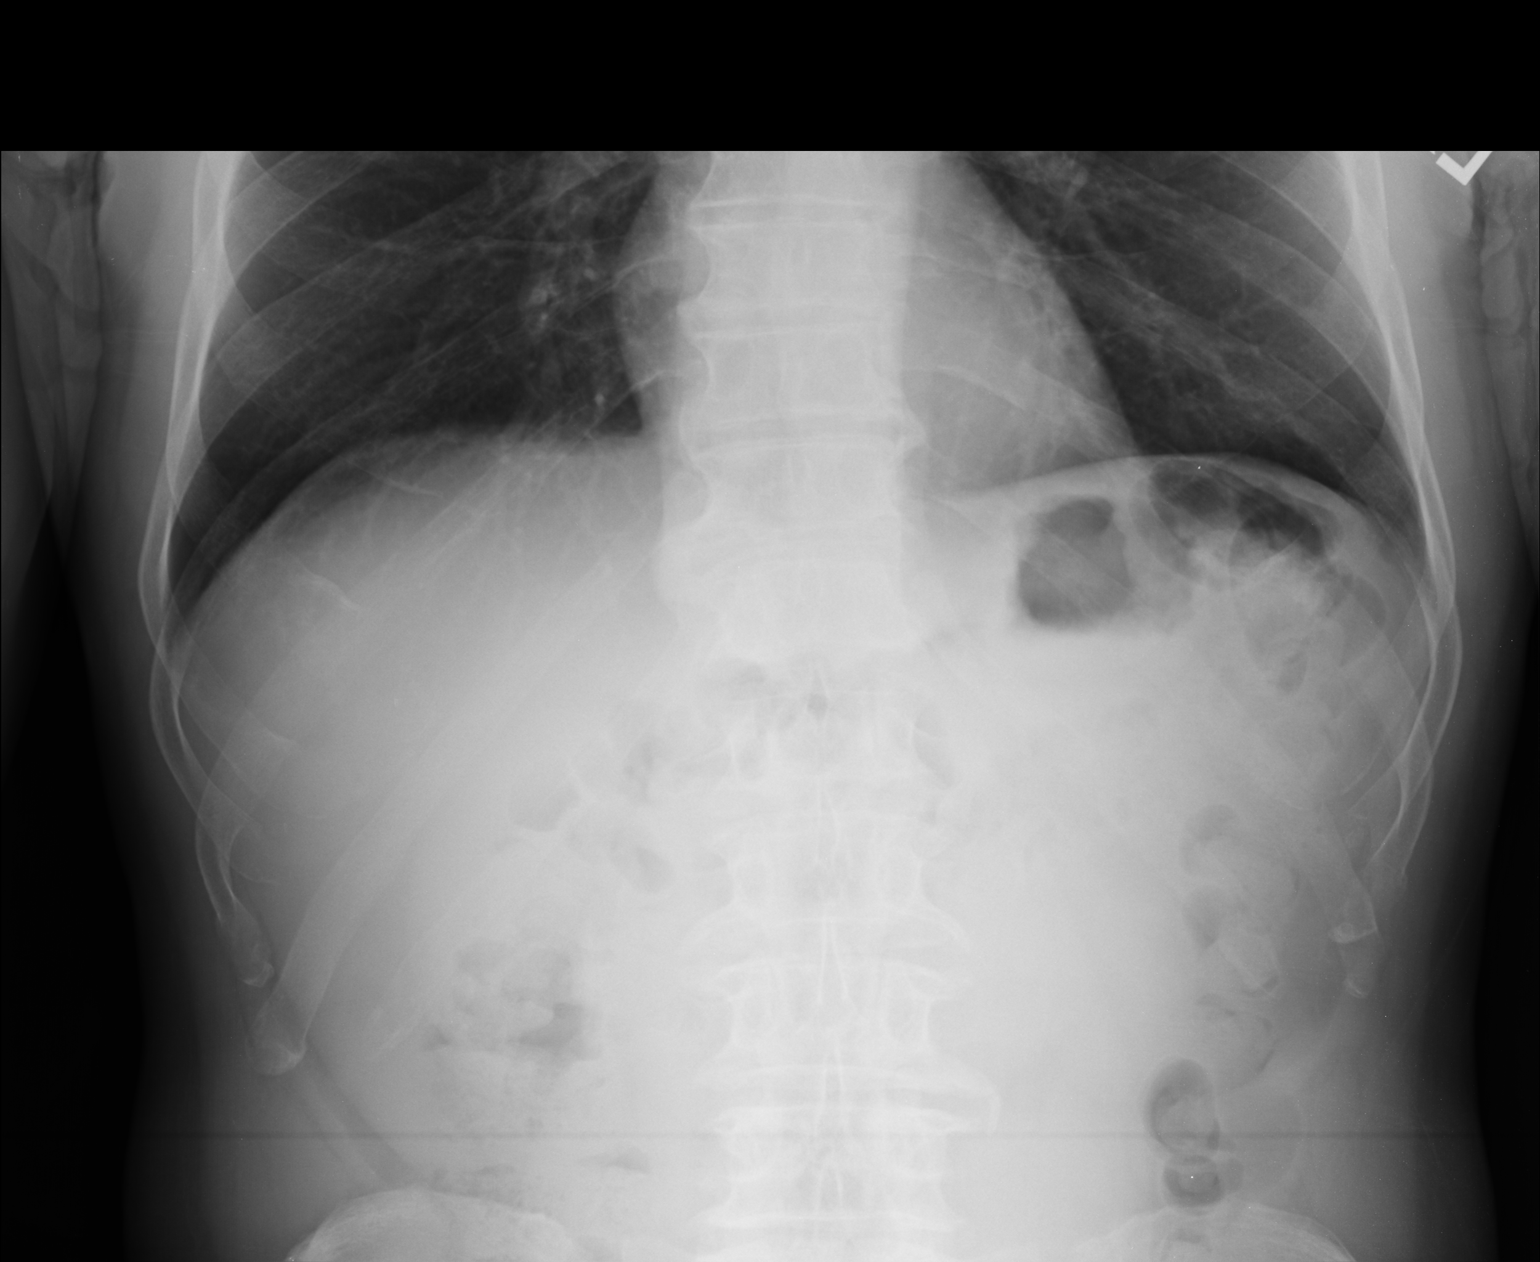

[3 of 3 positions shown; findings below may reference images not displayed]

FINDINGS: Cardiac silhouette normal in size and configuration. Normal
mediastinal and hilar contours.

Clear lungs.  No pleural effusion or pneumothorax.

Bony thorax is intact.
IMPRESSION: No active cardiopulmonary disease.

## 2016-12-26 DIAGNOSIS — Z86010 Personal history of colonic polyps: Secondary | ICD-10-CM | POA: Diagnosis not present

## 2016-12-26 DIAGNOSIS — K5730 Diverticulosis of large intestine without perforation or abscess without bleeding: Secondary | ICD-10-CM | POA: Diagnosis not present

## 2016-12-26 DIAGNOSIS — Z1211 Encounter for screening for malignant neoplasm of colon: Secondary | ICD-10-CM | POA: Diagnosis not present

## 2016-12-26 DIAGNOSIS — K648 Other hemorrhoids: Secondary | ICD-10-CM | POA: Diagnosis not present

## 2017-03-06 DIAGNOSIS — Z23 Encounter for immunization: Secondary | ICD-10-CM | POA: Diagnosis not present

## 2017-06-18 DIAGNOSIS — M85852 Other specified disorders of bone density and structure, left thigh: Secondary | ICD-10-CM | POA: Insufficient documentation

## 2017-06-18 DIAGNOSIS — D696 Thrombocytopenia, unspecified: Secondary | ICD-10-CM | POA: Insufficient documentation

## 2017-11-12 ENCOUNTER — Other Ambulatory Visit: Payer: Self-pay

## 2017-11-12 ENCOUNTER — Encounter (HOSPITAL_COMMUNITY): Payer: Self-pay | Admitting: Emergency Medicine

## 2017-11-12 ENCOUNTER — Inpatient Hospital Stay (HOSPITAL_COMMUNITY)
Admission: EM | Admit: 2017-11-12 | Discharge: 2017-11-14 | DRG: 247 | Disposition: A | Discharge status: Home / Self Care | Payer: Medicare Other | Attending: Internal Medicine | Admitting: Internal Medicine

## 2017-11-12 ENCOUNTER — Emergency Department (HOSPITAL_COMMUNITY): Payer: Medicare Other

## 2017-11-12 DIAGNOSIS — I214 Non-ST elevation (NSTEMI) myocardial infarction: Secondary | ICD-10-CM | POA: Diagnosis present

## 2017-11-12 DIAGNOSIS — R079 Chest pain, unspecified: Secondary | ICD-10-CM

## 2017-11-12 DIAGNOSIS — I129 Hypertensive chronic kidney disease with stage 1 through stage 4 chronic kidney disease, or unspecified chronic kidney disease: Secondary | ICD-10-CM | POA: Diagnosis present

## 2017-11-12 DIAGNOSIS — M1990 Unspecified osteoarthritis, unspecified site: Secondary | ICD-10-CM | POA: Diagnosis present

## 2017-11-12 DIAGNOSIS — E785 Hyperlipidemia, unspecified: Secondary | ICD-10-CM | POA: Diagnosis present

## 2017-11-12 DIAGNOSIS — E7800 Pure hypercholesterolemia, unspecified: Secondary | ICD-10-CM | POA: Diagnosis present

## 2017-11-12 DIAGNOSIS — Z955 Presence of coronary angioplasty implant and graft: Secondary | ICD-10-CM

## 2017-11-12 DIAGNOSIS — Z8249 Family history of ischemic heart disease and other diseases of the circulatory system: Secondary | ICD-10-CM

## 2017-11-12 DIAGNOSIS — I249 Acute ischemic heart disease, unspecified: Secondary | ICD-10-CM | POA: Diagnosis present

## 2017-11-12 DIAGNOSIS — I25110 Atherosclerotic heart disease of native coronary artery with unstable angina pectoris: Secondary | ICD-10-CM | POA: Diagnosis present

## 2017-11-12 DIAGNOSIS — I10 Essential (primary) hypertension: Secondary | ICD-10-CM | POA: Diagnosis present

## 2017-11-12 DIAGNOSIS — R001 Bradycardia, unspecified: Secondary | ICD-10-CM | POA: Diagnosis present

## 2017-11-12 DIAGNOSIS — N182 Chronic kidney disease, stage 2 (mild): Secondary | ICD-10-CM | POA: Diagnosis present

## 2017-11-12 DIAGNOSIS — Z87891 Personal history of nicotine dependence: Secondary | ICD-10-CM

## 2017-11-12 DIAGNOSIS — I209 Angina pectoris, unspecified: Secondary | ICD-10-CM | POA: Diagnosis present

## 2017-11-12 LAB — CBC
HEMATOCRIT: 42 % (ref 39.0–52.0)
Hemoglobin: 13.5 g/dL (ref 13.0–17.0)
MCH: 30.4 pg (ref 26.0–34.0)
MCHC: 32.1 g/dL (ref 30.0–36.0)
MCV: 94.6 fL (ref 78.0–100.0)
Platelets: 170 10*3/uL (ref 150–400)
RBC: 4.44 MIL/uL (ref 4.22–5.81)
RDW: 13.7 % (ref 11.5–15.5)
WBC: 6.6 10*3/uL (ref 4.0–10.5)

## 2017-11-12 LAB — BASIC METABOLIC PANEL
Anion gap: 7 (ref 5–15)
BUN: 14 mg/dL (ref 8–23)
CHLORIDE: 107 mmol/L (ref 98–111)
CO2: 25 mmol/L (ref 22–32)
Calcium: 9 mg/dL (ref 8.9–10.3)
Creatinine, Ser: 1.21 mg/dL (ref 0.61–1.24)
GFR calc non Af Amer: 60 mL/min — ABNORMAL LOW (ref >60)
Glucose, Bld: 91 mg/dL (ref 70–99)
POTASSIUM: 4.1 mmol/L (ref 3.5–5.1)
SODIUM: 139 mmol/L (ref 135–145)

## 2017-11-12 LAB — I-STAT TROPONIN, ED: Troponin i, poc: 0.08 ng/mL (ref 0.00–0.08)

## 2017-11-12 MED ORDER — ASPIRIN 81 MG PO CHEW
162.0000 mg | CHEWABLE_TABLET | Freq: Once | ORAL | Status: AC
  Administered 2017-11-12: 162 mg via ORAL
  Filled 2017-11-12: qty 2

## 2017-11-12 MED ORDER — MORPHINE SULFATE (PF) 4 MG/ML IV SOLN
4.0000 mg | Freq: Once | INTRAVENOUS | Status: AC
  Administered 2017-11-12: 4 mg via INTRAVENOUS
  Filled 2017-11-12: qty 1

## 2017-11-12 NOTE — ED Triage Notes (Signed)
Pt reports chest tightness present intermittently since yesterday. Currently states pain 2/10, non radiating. Denies SOB, N/V, dizziness/lightheadedness.

## 2017-11-12 NOTE — ED Provider Notes (Signed)
[facility] EMERGENCY DEPARTMENT Provider Note   CSN: [id number] Arrival date & time: 11/12/17  2127     History   Chief Complaint Chief Complaint  Patient presents with  . Chest Pain    HPI Kevin Wade is a 67 y.o. male.  Patient with past medical history remarkable for hyperlipidemia and hypertension presents to the emergency department with a chief complaint of chest pain.  He states that he has had intermittent chest pain since last night.  He reports associated dyspnea with exertion.  States that the pain is located on the left side of his chest and radiates to his left arm.  He is accompanied by his wife, who states that there is something going on, because he never complains about anything, and demanded to go to the fire department to be evaluated.  Patient states that he had 2 out of 10 chest pain in the waiting room, but now it is 6 out of 10.  He denies any shortness of breath, diaphoresis, nausea, vomiting.  He denies any pain radiating to his back.  Denies any history of PE or DVT.  Denies any recent illness.  The history is provided by the patient. No language interpreter was used.    Past Medical History:  Diagnosis Date  . Arthritis   . Hyperlipidemia   . Hypertension     There are no active problems to display for this patient.   Past Surgical History:  Procedure Laterality Date  . APPENDECTOMY          Home Medications    Prior to Admission medications   Not on File    Family History Family History  Problem Relation Age of Onset  . Hypertension Father     Social History Social History   Tobacco Use  . Smoking status: Never Smoker  . Smokeless tobacco: Never Used  Substance Use Topics  . Alcohol use: Yes    Comment: Socially   . Drug use: No     Allergies   Patient has no known allergies.   Review of Systems Review of Systems  All other systems reviewed and are negative.    Physical Exam Updated Vital  Signs BP (!) 180/91 (BP Location: Right Arm)   Pulse (!) 56   Temp 98.8 F (37.1 C) (Oral)   Resp 14   Ht 6' (1.829 m)   Wt 84.8 kg (187 lb)   SpO2 99%   BMI 25.36 kg/m   Physical Exam  Constitutional: He is oriented to person, place, and time. He appears well-developed and well-nourished.  HENT:  Head: Normocephalic and atraumatic.  Eyes: Pupils are equal, round, and reactive to light. Conjunctivae and EOM are normal. Right eye exhibits no discharge. Left eye exhibits no discharge. No scleral icterus.  Neck: Normal range of motion. Neck supple. No JVD present.  Cardiovascular: Normal rate, regular rhythm and normal heart sounds. Exam reveals no gallop and no friction rub.  No murmur heard. Pulmonary/Chest: Effort normal and breath sounds normal. No respiratory distress. He has no wheezes. He has no rales. He exhibits no tenderness.  Abdominal: Soft. He exhibits no distension and no mass. There is no tenderness. There is no rebound and no guarding.  Musculoskeletal: Normal range of motion. He exhibits no edema or tenderness.  Neurological: He is alert and oriented to person, place, and time.  Skin: Skin is warm and dry.  Psychiatric: He has a normal mood and affect. His behavior is normal.  Judgment and thought content normal.  Nursing note and vitals reviewed.    ED Treatments / Results  Labs (all labs ordered are listed, but only abnormal results are displayed) Labs Reviewed  BASIC METABOLIC PANEL - Abnormal; Notable for the following components:      Result Value   GFR calc non Af Amer 60 (*)    All other components within normal limits  TROPONIN I - Abnormal; Notable for the following components:   Troponin I 0.07 (*)    All other components within normal limits  MRSA PCR SCREENING  CBC  TROPONIN I  TROPONIN I  BASIC METABOLIC PANEL  HIV ANTIBODY (ROUTINE TESTING)  I-STAT TROPONIN, ED    EKG None  Radiology Dg Chest 2 View  Result Date: 11/12/2017 CLINICAL  DATA:  Acute chest pain for several hours. EXAM: CHEST - 2 VIEW COMPARISON:  01/04/2015 chest radiograph FINDINGS: The cardiomediastinal silhouette is unremarkable. There is no evidence of focal airspace disease, pulmonary edema, suspicious pulmonary nodule/mass, pleural effusion, or pneumothorax. No acute bony abnormalities are identified. IMPRESSION: No active cardiopulmonary disease. Electronically Signed   By: [name]  Hu M.D.   On: 11/12/2017 22:11    Procedures Procedures (including critical care time)  Medications Ordered in ED Medications  aspirin chewable tablet 162 mg (has no administration in time range)  morphine 4 MG/ML injection 4 mg (has no administration in time range)     Initial Impression / Assessment and Plan / ED Course  I have reviewed the triage vital signs and the nursing notes.  Pertinent labs & imaging results that were available during my care of the patient were reviewed by me and considered in my medical decision making (see chart for details).     Patient presents with chest pain that started yesterday.  Intermittent, increasing frequency.  Associated exertional SOB.  DDx includes ACS, PE, pneumothorax, aortic dissection, esophageal rupture, pericarditis, chest wall pain.  Troponin is 0.08.  Doubt ACS, normal troponin, no ischemic EKG findings, HEART score is: 5.  Low risk for PE, Well's PE score is 0, patient is not tachycardic nor hypoxic.  No evidence of pneumothorax on CXR.  Doubt dissection, no mediastinal widening on CXR, no ripping/tearing chest pain, neurovascularly intact.  Doubt pericarditis, no positional changes, or diffuse ST elevations on EKG.  Pain is not reproducible, doubt MSK.  Still having some pain.  Patient seen by and discussed with Dr. Jeraldine LootsLockwood, who agrees with plan for admission.  All findings were discussed with patient.  Patient understands and agrees with the plan.     Final Clinical Impressions(s) / ED Diagnoses   Final  diagnoses:  Chest pain, unspecified type    ED Discharge Orders    None       Roxy HorsemanBrowning, [place], PA-C 11/13/17 0604    Gerhard MunchLockwood, Cherl Gorney, MD 11/18/17 2136

## 2017-11-12 NOTE — ED Notes (Signed)
Per Dr. Jeraldine LootsLockwood pt needs to be NEXT

## 2017-11-13 ENCOUNTER — Encounter (HOSPITAL_COMMUNITY): Payer: Self-pay | Admitting: Family Medicine

## 2017-11-13 ENCOUNTER — Other Ambulatory Visit: Payer: Self-pay

## 2017-11-13 ENCOUNTER — Inpatient Hospital Stay (HOSPITAL_COMMUNITY)
Admission: EM | Disposition: A | Discharge status: Home / Self Care | Payer: Self-pay | Source: Home / Self Care | Attending: Internal Medicine

## 2017-11-13 DIAGNOSIS — I249 Acute ischemic heart disease, unspecified: Secondary | ICD-10-CM | POA: Diagnosis present

## 2017-11-13 DIAGNOSIS — I214 Non-ST elevation (NSTEMI) myocardial infarction: Secondary | ICD-10-CM | POA: Diagnosis present

## 2017-11-13 DIAGNOSIS — Z8249 Family history of ischemic heart disease and other diseases of the circulatory system: Secondary | ICD-10-CM | POA: Diagnosis not present

## 2017-11-13 DIAGNOSIS — I208 Other forms of angina pectoris: Secondary | ICD-10-CM | POA: Diagnosis not present

## 2017-11-13 DIAGNOSIS — E7800 Pure hypercholesterolemia, unspecified: Secondary | ICD-10-CM | POA: Diagnosis not present

## 2017-11-13 DIAGNOSIS — R001 Bradycardia, unspecified: Secondary | ICD-10-CM | POA: Diagnosis present

## 2017-11-13 DIAGNOSIS — I10 Essential (primary) hypertension: Secondary | ICD-10-CM

## 2017-11-13 DIAGNOSIS — I209 Angina pectoris, unspecified: Secondary | ICD-10-CM

## 2017-11-13 DIAGNOSIS — M1990 Unspecified osteoarthritis, unspecified site: Secondary | ICD-10-CM | POA: Diagnosis present

## 2017-11-13 DIAGNOSIS — I2510 Atherosclerotic heart disease of native coronary artery without angina pectoris: Secondary | ICD-10-CM | POA: Diagnosis not present

## 2017-11-13 DIAGNOSIS — I25110 Atherosclerotic heart disease of native coronary artery with unstable angina pectoris: Secondary | ICD-10-CM | POA: Diagnosis present

## 2017-11-13 DIAGNOSIS — E785 Hyperlipidemia, unspecified: Secondary | ICD-10-CM | POA: Diagnosis present

## 2017-11-13 DIAGNOSIS — R079 Chest pain, unspecified: Secondary | ICD-10-CM | POA: Diagnosis not present

## 2017-11-13 DIAGNOSIS — Z955 Presence of coronary angioplasty implant and graft: Secondary | ICD-10-CM | POA: Diagnosis not present

## 2017-11-13 DIAGNOSIS — R0789 Other chest pain: Secondary | ICD-10-CM | POA: Diagnosis present

## 2017-11-13 DIAGNOSIS — N182 Chronic kidney disease, stage 2 (mild): Secondary | ICD-10-CM | POA: Diagnosis present

## 2017-11-13 DIAGNOSIS — Z87891 Personal history of nicotine dependence: Secondary | ICD-10-CM | POA: Diagnosis not present

## 2017-11-13 DIAGNOSIS — I129 Hypertensive chronic kidney disease with stage 1 through stage 4 chronic kidney disease, or unspecified chronic kidney disease: Secondary | ICD-10-CM | POA: Diagnosis present

## 2017-11-13 HISTORY — PX: LEFT HEART CATH AND CORONARY ANGIOGRAPHY: CATH118249

## 2017-11-13 HISTORY — PX: CORONARY STENT INTERVENTION: CATH118234

## 2017-11-13 LAB — TROPONIN I
TROPONIN I: 0.07 ng/mL — AB (ref <0.03)
TROPONIN I: 0.26 ng/mL — AB (ref <0.03)
Troponin I: 0.12 ng/mL (ref <0.03)

## 2017-11-13 LAB — HIV ANTIBODY (ROUTINE TESTING W REFLEX): HIV SCREEN 4TH GENERATION: NONREACTIVE

## 2017-11-13 LAB — BASIC METABOLIC PANEL
Anion gap: 10 (ref 5–15)
BUN: 10 mg/dL (ref 8–23)
CO2: 24 mmol/L (ref 22–32)
Calcium: 9.2 mg/dL (ref 8.9–10.3)
Chloride: 107 mmol/L (ref 98–111)
Creatinine, Ser: 1.09 mg/dL (ref 0.61–1.24)
Glucose, Bld: 94 mg/dL (ref 70–99)
Potassium: 4 mmol/L (ref 3.5–5.1)
SODIUM: 141 mmol/L (ref 135–145)

## 2017-11-13 LAB — MRSA PCR SCREENING: MRSA BY PCR: NEGATIVE

## 2017-11-13 LAB — POCT ACTIVATED CLOTTING TIME
ACTIVATED CLOTTING TIME: 224 s
Activated Clotting Time: 290 seconds

## 2017-11-13 SURGERY — LEFT HEART CATH AND CORONARY ANGIOGRAPHY
Anesthesia: LOCAL

## 2017-11-13 MED ORDER — HEART ATTACK BOUNCING BOOK
Freq: Once | Status: AC
  Administered 2017-11-13: 23:00:00
  Filled 2017-11-13: qty 1

## 2017-11-13 MED ORDER — OMEGA-3-ACID ETHYL ESTERS 1 G PO CAPS
2.0000 g | ORAL_CAPSULE | Freq: Every day | ORAL | Status: DC
Start: 2017-11-13 — End: 2017-11-14
  Administered 2017-11-13 – 2017-11-14 (×2): 2 g via ORAL
  Filled 2017-11-13 (×3): qty 2

## 2017-11-13 MED ORDER — NITROGLYCERIN 1 MG/10 ML FOR IR/CATH LAB
INTRA_ARTERIAL | Status: AC
  Filled 2017-11-13: qty 10

## 2017-11-13 MED ORDER — IOHEXOL 350 MG/ML SOLN
INTRAVENOUS | Status: DC | PRN
  Administered 2017-11-13: 100 mL via INTRA_ARTERIAL

## 2017-11-13 MED ORDER — SODIUM CHLORIDE 0.9% FLUSH: 3.0000 mL | Freq: Two times a day (BID) | INTRAVENOUS | Status: DC

## 2017-11-13 MED ORDER — ENOXAPARIN SODIUM 40 MG/0.4ML ~~LOC~~ SOLN
40.0000 mg | Freq: Every day | SUBCUTANEOUS | Status: DC
  Filled 2017-11-13: qty 0.4

## 2017-11-13 MED ORDER — ALPRAZOLAM 0.25 MG PO TABS: 0.2500 mg | ORAL_TABLET | Freq: Two times a day (BID) | ORAL | Status: DC | PRN

## 2017-11-13 MED ORDER — LABETALOL HCL 5 MG/ML IV SOLN: 10.0000 mg | INTRAVENOUS | Status: AC | PRN

## 2017-11-13 MED ORDER — HEPARIN SODIUM (PORCINE) 1000 UNIT/ML IJ SOLN
INTRAMUSCULAR | Status: DC | PRN
  Administered 2017-11-13 (×2): 5000 [IU] via INTRAVENOUS
  Administered 2017-11-13: 4000 [IU] via INTRAVENOUS

## 2017-11-13 MED ORDER — HEPARIN (PORCINE) IN NACL 2000-0.9 UNIT/L-% IV SOLN
INTRAVENOUS | Status: AC
  Filled 2017-11-13: qty 2000

## 2017-11-13 MED ORDER — HEPARIN SODIUM (PORCINE) 1000 UNIT/ML IJ SOLN
INTRAMUSCULAR | Status: AC
  Filled 2017-11-13: qty 1

## 2017-11-13 MED ORDER — LISINOPRIL 20 MG PO TABS: 20.0000 mg | ORAL_TABLET | Freq: Every day | ORAL | Status: DC

## 2017-11-13 MED ORDER — SODIUM CHLORIDE 0.9% FLUSH: 3.0000 mL | INTRAVENOUS | Status: DC | PRN

## 2017-11-13 MED ORDER — ANGIOPLASTY BOOK
Freq: Once | Status: AC
  Administered 2017-11-13: 23:00:00
  Filled 2017-11-13: qty 1

## 2017-11-13 MED ORDER — FAMOTIDINE 20 MG PO TABS
40.0000 mg | ORAL_TABLET | Freq: Every day | ORAL | Status: DC
  Administered 2017-11-13 – 2017-11-14 (×2): 40 mg via ORAL
  Filled 2017-11-13 (×3): qty 2

## 2017-11-13 MED ORDER — SODIUM CHLORIDE 0.9 % IV SOLN: 250.0000 mL | INTRAVENOUS | Status: DC | PRN

## 2017-11-13 MED ORDER — SODIUM CHLORIDE 0.9 % IV SOLN: INTRAVENOUS | Status: DC

## 2017-11-13 MED ORDER — ACETAMINOPHEN 325 MG PO TABS
650.0000 mg | ORAL_TABLET | ORAL | Status: DC | PRN
  Filled 2017-11-13: qty 2

## 2017-11-13 MED ORDER — HYDRALAZINE HCL 20 MG/ML IJ SOLN: 5.0000 mg | INTRAMUSCULAR | Status: AC | PRN

## 2017-11-13 MED ORDER — ASPIRIN EC 81 MG PO TBEC
81.0000 mg | DELAYED_RELEASE_TABLET | Freq: Every day | ORAL | Status: DC
  Administered 2017-11-14: 81 mg via ORAL
  Filled 2017-11-13: qty 1

## 2017-11-13 MED ORDER — OXYCODONE HCL 5 MG PO TABS: 5.0000 mg | ORAL_TABLET | ORAL | Status: DC | PRN

## 2017-11-13 MED ORDER — ATORVASTATIN CALCIUM 80 MG PO TABS
80.0000 mg | ORAL_TABLET | Freq: Every day | ORAL | Status: DC
  Administered 2017-11-13: 80 mg via ORAL
  Filled 2017-11-13: qty 1

## 2017-11-13 MED ORDER — TICAGRELOR 90 MG PO TABS
90.0000 mg | ORAL_TABLET | Freq: Two times a day (BID) | ORAL | Status: DC
  Administered 2017-11-13 – 2017-11-14 (×2): 90 mg via ORAL
  Filled 2017-11-13 (×2): qty 1

## 2017-11-13 MED ORDER — LISINOPRIL 20 MG PO TABS
20.0000 mg | ORAL_TABLET | Freq: Every day | ORAL | Status: DC
  Administered 2017-11-14: 10:00:00 20 mg via ORAL
  Filled 2017-11-13: qty 1
  Filled 2017-11-13: qty 2

## 2017-11-13 MED ORDER — VERAPAMIL HCL 2.5 MG/ML IV SOLN
INTRAVENOUS | Status: DC | PRN
  Administered 2017-11-13: 10 mL via INTRA_ARTERIAL

## 2017-11-13 MED ORDER — SODIUM CHLORIDE 0.9 % WEIGHT BASED INFUSION: 1.0000 mL/kg/h | INTRAVENOUS | Status: AC

## 2017-11-13 MED ORDER — NITROGLYCERIN 1 MG/10 ML FOR IR/CATH LAB
INTRA_ARTERIAL | Status: DC | PRN
  Administered 2017-11-13: 200 ug via INTRACORONARY

## 2017-11-13 MED ORDER — LIDOCAINE HCL (PF) 1 % IJ SOLN
INTRAMUSCULAR | Status: AC
  Filled 2017-11-13: qty 30

## 2017-11-13 MED ORDER — MORPHINE SULFATE (PF) 4 MG/ML IV SOLN: 2.0000 mg | INTRAVENOUS | Status: DC | PRN

## 2017-11-13 MED ORDER — SODIUM CHLORIDE 0.9 % WEIGHT BASED INFUSION: 1.0000 mL/kg/h | INTRAVENOUS | Status: DC

## 2017-11-13 MED ORDER — VERAPAMIL HCL 2.5 MG/ML IV SOLN
INTRAVENOUS | Status: AC
  Filled 2017-11-13: qty 2

## 2017-11-13 MED ORDER — ATORVASTATIN CALCIUM 20 MG PO TABS: 20.0000 mg | ORAL_TABLET | Freq: Every day | ORAL | Status: DC

## 2017-11-13 MED ORDER — ONDANSETRON HCL 4 MG/2ML IJ SOLN: 4.0000 mg | Freq: Four times a day (QID) | INTRAMUSCULAR | Status: DC | PRN

## 2017-11-13 MED ORDER — TICAGRELOR 90 MG PO TABS
ORAL_TABLET | ORAL | Status: AC
  Filled 2017-11-13: qty 2

## 2017-11-13 MED ORDER — SODIUM CHLORIDE 0.9 % WEIGHT BASED INFUSION
3.0000 mL/kg/h | INTRAVENOUS | Status: DC
  Administered 2017-11-13: 3 mL/kg/h via INTRAVENOUS

## 2017-11-13 MED ORDER — HEPARIN (PORCINE) IN NACL 100-0.45 UNIT/ML-% IJ SOLN: 1150.0000 [IU]/h | INTRAMUSCULAR | Status: DC

## 2017-11-13 MED ORDER — MIDAZOLAM HCL 2 MG/2ML IJ SOLN
INTRAMUSCULAR | Status: AC
  Filled 2017-11-13: qty 2

## 2017-11-13 MED ORDER — TICAGRELOR 90 MG PO TABS
ORAL_TABLET | ORAL | Status: DC | PRN
  Administered 2017-11-13: 180 mg via ORAL

## 2017-11-13 MED ORDER — MIDAZOLAM HCL 2 MG/2ML IJ SOLN
INTRAMUSCULAR | Status: DC | PRN
  Administered 2017-11-13: 2 mg via INTRAVENOUS

## 2017-11-13 MED ORDER — NITROGLYCERIN IN D5W 200-5 MCG/ML-% IV SOLN
INTRAVENOUS | Status: AC
  Filled 2017-11-13: qty 250

## 2017-11-13 MED ORDER — HEPARIN BOLUS VIA INFUSION
4000.0000 [IU] | Freq: Once | INTRAVENOUS | Status: DC
  Filled 2017-11-13: qty 4000

## 2017-11-13 MED ORDER — NITROGLYCERIN 0.4 MG SL SUBL
0.4000 mg | SUBLINGUAL_TABLET | SUBLINGUAL | Status: DC | PRN
  Administered 2017-11-13: 0.4 mg via SUBLINGUAL
  Filled 2017-11-13 (×2): qty 1

## 2017-11-13 MED ORDER — NITROGLYCERIN IN D5W 200-5 MCG/ML-% IV SOLN
0.0000 ug/min | INTRAVENOUS | Status: DC
  Administered 2017-11-13: 10 ug/min via INTRAVENOUS

## 2017-11-13 MED ORDER — FENTANYL CITRATE (PF) 100 MCG/2ML IJ SOLN
INTRAMUSCULAR | Status: DC | PRN
  Administered 2017-11-13: 25 ug via INTRAVENOUS

## 2017-11-13 MED ORDER — HEPARIN (PORCINE) IN NACL 2-0.9 UNITS/ML
INTRAMUSCULAR | Status: AC | PRN
  Administered 2017-11-13 (×2): 500 mL

## 2017-11-13 MED ORDER — FENTANYL CITRATE (PF) 100 MCG/2ML IJ SOLN
INTRAMUSCULAR | Status: AC
  Filled 2017-11-13: qty 2

## 2017-11-13 MED ORDER — SODIUM CHLORIDE 0.9% FLUSH
3.0000 mL | Freq: Two times a day (BID) | INTRAVENOUS | Status: DC
  Administered 2017-11-13: 23:00:00 3 mL via INTRAVENOUS

## 2017-11-13 MED ORDER — OMEGA-3-ACID ETHYL ESTERS 1 G PO CAPS: 1.0000 g | ORAL_CAPSULE | Freq: Two times a day (BID) | ORAL | Status: DC

## 2017-11-13 SURGICAL SUPPLY — 18 items
BALLN EMERGE MR 2.5X12 (BALLOONS) ×2
BALLN ~~LOC~~ EMERGE MR 3.75X15 (BALLOONS) ×2
BALLOON EMERGE MR 2.5X12 (BALLOONS) ×1 IMPLANT
BALLOON ~~LOC~~ EMERGE MR 3.75X15 (BALLOONS) ×1 IMPLANT
CATH 5FR JL3.5 JR4 ANG PIG MP (CATHETERS) ×2 IMPLANT
CATH LAUNCHER 6FR JR4 (CATHETERS) ×2 IMPLANT
DEVICE RAD COMP TR BAND LRG (VASCULAR PRODUCTS) ×2 IMPLANT
GLIDESHEATH SLEND SS 6F .021 (SHEATH) ×2 IMPLANT
GUIDEWIRE INQWIRE 1.5J.035X260 (WIRE) ×1 IMPLANT
INQWIRE 1.5J .035X260CM (WIRE) ×2
KIT ENCORE 26 ADVANTAGE (KITS) ×2 IMPLANT
KIT HEART LEFT (KITS) ×2 IMPLANT
PACK CARDIAC CATHETERIZATION (CUSTOM PROCEDURE TRAY) ×2 IMPLANT
STENT SIERRA 3.50 X 28 MM (Permanent Stent) ×2 IMPLANT
SYR MEDRAD MARK V 150ML (SYRINGE) ×2 IMPLANT
TRANSDUCER W/STOPCOCK (MISCELLANEOUS) ×2 IMPLANT
TUBING CIL FLEX 10 FLL-RA (TUBING) ×2 IMPLANT
WIRE COUGAR XT STRL 190CM (WIRE) ×2 IMPLANT

## 2017-11-13 NOTE — Progress Notes (Signed)
[id number] Education completed with pt and wife who voiced understanding. Stressed importance of brilinta with stent. Reviewed NTG use, ex ed, heart healthy food choices and CRP 2. Referred to [place] program. Unsure if pt had MI or not as it is written in cath report so MI booklet was given and MI restrictions reviewed. Luetta NuttingCharlene Halimah Bewick RN BSN 11/13/2017 2:39 PM

## 2017-11-13 NOTE — Plan of Care (Signed)
  Problem: Skin Integrity: Goal: Risk for impaired skin integrity will decrease Outcome: Progressing   

## 2017-11-13 NOTE — Interval H&P Note (Signed)
Cath Lab Visit (complete for each Cath Lab visit)  Clinical Evaluation Leading to the Procedure:   ACS: Yes.    Non-ACS:    Anginal Classification: CCS IV  Anti-ischemic medical therapy: No Therapy  Non-Invasive Test Results: No non-invasive testing performed  Prior CABG: No previous CABG      History and Physical Interval Note:  11/13/2017 9:58 AM  Kevin Wade  has presented today for surgery, with the diagnosis of cp  The various methods of treatment have been discussed with the patient and family. After consideration of risks, benefits and other options for treatment, the patient has consented to  Procedure(s): LEFT HEART CATH AND CORONARY ANGIOGRAPHY (N/A) as a surgical intervention .  The patient's history has been reviewed, patient examined, no change in status, stable for surgery.  I have reviewed the patient's chart and labs.  Questions were answered to the patient's satisfaction.     Tonny BollmanMichael Nazier Neyhart

## 2017-11-13 NOTE — Consult Note (Addendum)
 Cardiology Consultation:   Patient ID: Kevin Wade; [id number]; 08/02/1950   Admit date: 11/12/2017 Date of Consult: 11/13/2017  Primary Care Provider: Wilson, Alex M, DO Primary Cardiologist: New to [facility]; Dr. Windi Toro Primary Electrophysiologist:  None   Patient Profile:   Kevin Wade is a 67 y.o. male with a PMH of HTN and HLD who is being seen today for the evaluation of chest pain at the request of Dr. Arrien.  History of Present Illness:   Kevin Wade was in his usual state of health until the evening of 11/11/17 when he developed sudden onset left sided burning chest pressure with radiation to the left arm and SOB. He states his symptoms lasted for a couple minutes and resolved spontaneously. He denied associated diaphoresis, nausea, vomiting, dizziness, or syncope. He states he has never experienced chest pain before. He has continued to have intermittent episodes since initial onset which occur mainly at rest, again lasting for a couple minutes and resolving spontaneously and occasionally associated with lightheadedness. He denies any exacerbating or alleviating factors.   He does not follow with a cardiologist and has no know heart disease history. He denies prior ischemic evaluation. States his father had an MI in his 30s and thinks his sister may have heart disease. Risk factors for heart disease include HTN, HLD, family history of early onset CAD, and former smoker.  At the time of this evaluation, he continues to have intermittent smoldering chest pain. He had an episode while I was talking with him, however his symptoms resolved prior to giving SL nitro. Prior to these episodes starting, he denied exertional CP or SOB. He denied recent fever, URI, orthopnea, PND, LE edema, or problems with bleeding.   Hospital course: Hypertensive on arrival to 180/91, improved to 135/86 this morning, bradycardic to the 40s, otherwise VSS. Labs notable for electrolytes wnl, Cr 1.21,  CBC wnl, Trop 0.08>0.07. CXR without acute findings. EKG with sinus rhythm with ST-T wave abnormalities in precordial leads (no comparison). Cardiology consulted for chest pain.  Past Medical History:  Diagnosis Date  . Arthritis   . Hyperlipidemia   . Hypertension     Past Surgical History:  Procedure Laterality Date  . APPENDECTOMY       Home Medications:  Prior to Admission medications   Medication Sig Start Date End Date Taking? Authorizing Provider  aspirin EC 81 MG tablet Take 81 mg by mouth daily.   Yes [provider]  atorvastatin (LIPITOR) 20 MG tablet Take 20 mg by mouth.    Yes [provider]  lisinopril (PRINIVIL,ZESTRIL) 20 MG tablet Take 20 mg by mouth.    Yes [provider]  Misc Natural Products (OSTEO BI-FLEX JOINT SHIELD) TABS Take 1 tablet by mouth daily.   Yes [provider]  Multiple Vitamins-Minerals (ONE-A-DAY MENS 50+ ADVANTAGE) TABS Take 1 tablet by mouth daily.   Yes [provider]  Omega-3 1000 MG CAPS Take 1,000 mg by mouth daily.   Yes [provider]    Inpatient Medications: Scheduled Meds: . [START ON 11/14/2017] aspirin EC  81 mg Oral Daily  . atorvastatin  20 mg Oral q1800  . enoxaparin (LOVENOX) injection  40 mg Subcutaneous QHS  . lisinopril  20 mg Oral Daily  . omega-3 acid ethyl esters  2 g Oral Daily   Continuous Infusions:  PRN Meds: acetaminophen, ALPRAZolam, morphine injection, nitroGLYCERIN, ondansetron (ZOFRAN) IV  Allergies:   No Known Allergies  Social History:   Social   History   Socioeconomic History  . Marital status: Married    Spouse name: Not on file  . Number of children: Not on file  . Years of education: Not on file  . Highest education level: Not on file  Occupational History  . Not on file  Social Needs  . Financial resource strain: Not on file  . Food insecurity:    Worry: Not on file    Inability: Not on file  . Transportation needs:    Medical:  Not on file    Non-medical: Not on file  Tobacco Use  . Smoking status: Never Smoker  . Smokeless tobacco: Never Used  Substance and Sexual Activity  . Alcohol use: Yes    Comment: Socially   . Drug use: No  . Sexual activity: Not on file  Lifestyle  . Physical activity:    Days per week: Not on file    Minutes per session: Not on file  . Stress: Not on file  Relationships  . Social connections:    Talks on phone: Not on file    Gets together: Not on file    Attends religious service: Not on file    Active member of club or organization: Not on file    Attends meetings of clubs or organizations: Not on file    Relationship status: Not on file  . Intimate partner violence:    Fear of current or ex partner: Not on file    Emotionally abused: Not on file    Physically abused: Not on file    Forced sexual activity: Not on file  Other Topics Concern  . Not on file  Social History Narrative  . Not on file    Family History:    Family History  Problem Relation Age of Onset  . Hypertension Father      ROS:  Please see the history of present illness.   All other ROS reviewed and negative.     Physical Exam/Data:   Vitals:   11/13/17 0015 11/13/17 0028 11/13/17 0126 11/13/17 0640  BP: (!) 169/93  (!) 162/92 135/86  Pulse: (!) 58  (!) 51 (!) 45  Resp: 14  16 11  Temp:   98.5 F (36.9 C)   TempSrc:   Oral   SpO2: 99% 100% 100% 100%  Weight:   186 lb 9.6 oz (84.6 kg)   Height:   6' (1.829 m)     Intake/Output Summary (Last 24 hours) at 11/13/2017 0749 Last data filed at 11/12/2017 2337 Gross per 24 hour  Intake -  Output 550 ml  Net -550 ml   Filed Weights   11/12/17 2134 11/13/17 0126  Weight: 187 lb (84.8 kg) 186 lb 9.6 oz (84.6 kg)   Body mass index is 25.31 kg/m.  General:  Well nourished, well developed, laying in bed with intermitted chest pain HEENT: sclera anicteric  Neck: no JVD Vascular: No carotid bruits; distal pulses 2+ bilaterally Cardiac:   normal S1, S2; RRR; no murmurs, gallops, or rubs Lungs:  clear to auscultation bilaterally, no wheezing, rhonchi or rales  Abd: NABS, soft, nontender, no hepatomegaly Ext: no edema Musculoskeletal:  No deformities, BUE and BLE strength normal and equal Skin: warm and dry  Neuro:  CNs 2-12 intact, no focal abnormalities noted Psych:  Normal affect   EKG:  The EKG was personally reviewed and demonstrates:   sinus rhythm with ST-T wave abnormalities in precordial leads (no comparison).  Telemetry:  Telemetry   was personally reviewed and demonstrates:  Sinus bradycardia with occasional PVC  Relevant CV Studies: None  Laboratory Data:  Chemistry Recent Labs  Lab 11/12/17 2144  NA 139  K 4.1  CL 107  CO2 25  GLUCOSE 91  BUN 14  CREATININE 1.21  CALCIUM 9.0  GFRNONAA 60*  GFRAA >60  ANIONGAP 7    No results for input(s): PROT, ALBUMIN, AST, ALT, ALKPHOS, BILITOT in the last 168 hours. Hematology Recent Labs  Lab 11/12/17 2144  WBC 6.6  RBC 4.44  HGB 13.5  HCT 42.0  MCV 94.6  MCH 30.4  MCHC 32.1  RDW 13.7  PLT 170   Cardiac Enzymes Recent Labs  Lab 11/13/17 0122  TROPONINI 0.07*    Recent Labs  Lab 11/12/17 2201  TROPIPOC 0.08    BNPNo results for input(s): BNP, PROBNP in the last 168 hours.  DDimer No results for input(s): DDIMER in the last 168 hours.  Radiology/Studies:  Dg Chest 2 View  Result Date: 11/12/2017 CLINICAL DATA:  Acute chest pain for several hours. EXAM: CHEST - 2 VIEW COMPARISON:  01/04/2015 chest radiograph FINDINGS: The cardiomediastinal silhouette is unremarkable. There is no evidence of focal airspace disease, pulmonary edema, suspicious pulmonary nodule/mass, pleural effusion, or pneumothorax. No acute bony abnormalities are identified. IMPRESSION: No active cardiopulmonary disease. Electronically Signed   By: Jeffrey  Hu M.D.   On: 11/12/2017 22:11    Assessment and Plan:   1. Chest pain: patient p/w sudden onset CP 11/11/17 while  doing house chores, a/w radiation of pain to left arm and SOB. Pain resolved spontaneously after a couple minutes but continued to reoccur over the next day, primarily at rest, and without identifiable aggravating or alleviating factors. Trop 0.08>0.07. EKG with ST-T wave abnormalities in precordial leads. CXR negative. Risk factors for ACS include HTN, HLD, and former smoker. He was given ASA and morphine in the ED with improvement in pain. Given ongoing pain and mild troponin elevations, definitely concerned for ACS. - Will plan for LHC today - keep NPO - Will start heparin gtt  - Will start nitro gtt given ongoing pain - Unable to start low dose BBlocker given bradycardia  2. HTN: BP elevated on presentation; improved this morning - Will hold home lisinopril in anticipation of LHC today - anticipate restarting post cath if Cr stable  3. HLD: no lipids on file - Continue atorvastatin and omega 3   For questions or updates, please contact [facility] Please consult [email or web address] for contact info under Cardiology/STEMI.   Signed, Krista M. Kroeger, PA-C  11/13/2017 7:49 AM [phone]  I have seen and examined the patient along with Krista M. Kroeger, PA-C .  I have reviewed the chart, notes and new data.  I agree with PA's note.  Key new complaints: Had steadily worsening chest pressure during our interview; improved with sublingual nitroglycerin Key examination changes: S4 present, otherwise normal cardiovascular exam Key new findings / data: ECG shows sinus bradycardia in a pattern that could be attributed to early repolarization due to the presence of J-point elevation, but is also highly suspicious for active ischemia.  Old ECG is not available for comparison.  He moved here from [place] roughly 3 years ago.  Minimal elevation in cardiac troponin I.  PLAN: Recommend early evaluation with coronary angiography.  We will start intravenous heparin and intravenous nitroglycerin. If we  do not identify any significant coronary disease, it's possible that his episodes of chest discomfort are related to spikes   in hypertension, but coronary insufficiency appears to be a more likely explanation for his presentation.  Rainee Sweatt, MD, FACC [facility] [phone] 11/13/2017, 9:50 AM   

## 2017-11-13 NOTE — Progress Notes (Signed)
TR BAND REMOVAL  LOCATION:    right radial  DEFLATED PER PROTOCOL:    Yes.    TIME BAND OFF / DRESSING APPLIED:    1630   SITE UPON ARRIVAL:    Level 0  SITE AFTER BAND REMOVAL:    Level 0  CIRCULATION SENSATION AND MOVEMENT:    Within Normal Limits   Yes.    COMMENTS:   Checked remainder of shift with no change

## 2017-11-13 NOTE — ED Notes (Signed)
Occasional PVCs still noted on EKG; pt denies pain at present; remains on oxygen

## 2017-11-13 NOTE — H&P (View-Only) (Signed)
Cardiology Consultation:   Patient ID: Kevin Wade; [id number]; 1950-08-17   Admit date: 11/12/2017 Date of Consult: 11/13/2017  Primary Care Provider: Yolanda MangesWilson, Alex M, DO Primary Cardiologist: [facility]; Dr. Royann Shiversroitoru Primary Electrophysiologist:  None   Patient Profile:   Kevin Wade is a 67 y.o. male with a PMH of HTN and HLD who is being seen today for the evaluation of chest pain at the request of Dr. Ella JubileeArrien.  History of Present Illness:   Kevin Wade was in his usual state of health until the evening of 11/11/17 when he developed sudden onset left sided burning chest pressure with radiation to the left arm and SOB. He states his symptoms lasted for a couple minutes and resolved spontaneously. He denied associated diaphoresis, nausea, vomiting, dizziness, or syncope. He states he has never experienced chest pain before. He has continued to have intermittent episodes since initial onset which occur mainly at rest, again lasting for a couple minutes and resolving spontaneously and occasionally associated with lightheadedness. He denies any exacerbating or alleviating factors.   He does not follow with a cardiologist and has no know heart disease history. He denies prior ischemic evaluation. States his father had an MI in his 7230s and thinks his sister may have heart disease. Risk factors for heart disease include HTN, HLD, family history of early onset CAD, and former smoker.  At the time of this evaluation, he continues to have intermittent smoldering chest pain. He had an episode while I was talking with him, however his symptoms resolved prior to giving SL nitro. Prior to these episodes starting, he denied exertional CP or SOB. He denied recent fever, URI, orthopnea, PND, LE edema, or problems with bleeding.   Hospital course: Hypertensive on arrival to 180/91, improved to 135/86 this morning, bradycardic to the 40s, otherwise VSS. Labs notable for electrolytes wnl, Cr 1.21,  CBC wnl, Trop 0.08>0.07. CXR without acute findings. EKG with sinus rhythm with ST-T wave abnormalities in precordial leads (no comparison). Cardiology consulted for chest pain.  Past Medical History:  Diagnosis Date  . Arthritis   . Hyperlipidemia   . Hypertension     Past Surgical History:  Procedure Laterality Date  . APPENDECTOMY       Home Medications:  Prior to Admission medications   Medication Sig Start Date End Date Taking? Authorizing Provider  aspirin EC 81 MG tablet Take 81 mg by mouth daily.   Yes [provider]  atorvastatin (LIPITOR) 20 MG tablet Take 20 mg by mouth.    Yes [provider]  lisinopril (PRINIVIL,ZESTRIL) 20 MG tablet Take 20 mg by mouth.    Yes [provider]  Misc Natural Products (OSTEO BI-FLEX JOINT SHIELD) TABS Take 1 tablet by mouth daily.   Yes [provider]  Multiple Vitamins-Minerals (ONE-A-DAY MENS 50+ ADVANTAGE) TABS Take 1 tablet by mouth daily.   Yes [provider]  Omega-3 1000 MG CAPS Take 1,000 mg by mouth daily.   Yes [provider]    Inpatient Medications: Scheduled Meds: . [START ON 11/14/2017] aspirin EC  81 mg Oral Daily  . atorvastatin  20 mg Oral q1800  . enoxaparin (LOVENOX) injection  40 mg Subcutaneous QHS  . lisinopril  20 mg Oral Daily  . omega-3 acid ethyl esters  2 g Oral Daily   Continuous Infusions:  PRN Meds: acetaminophen, ALPRAZolam, morphine injection, nitroGLYCERIN, ondansetron (ZOFRAN) IV  Allergies:   No Known Allergies  Social History:   Social  History   Socioeconomic History  . Marital status: Married    Spouse name: Not on file  . Number of children: Not on file  . Years of education: Not on file  . Highest education level: Not on file  Occupational History  . Not on file  Social Needs  . Financial resource strain: Not on file  . Food insecurity:    Worry: Not on file    Inability: Not on file  . Transportation needs:    Medical:  Not on file    Non-medical: Not on file  Tobacco Use  . Smoking status: Never Smoker  . Smokeless tobacco: Never Used  Substance and Sexual Activity  . Alcohol use: Yes    Comment: Socially   . Drug use: No  . Sexual activity: Not on file  Lifestyle  . Physical activity:    Days per week: Not on file    Minutes per session: Not on file  . Stress: Not on file  Relationships  . Social connections:    Talks on phone: Not on file    Gets together: Not on file    Attends religious service: Not on file    Active member of club or organization: Not on file    Attends meetings of clubs or organizations: Not on file    Relationship status: Not on file  . Intimate partner violence:    Fear of current or ex partner: Not on file    Emotionally abused: Not on file    Physically abused: Not on file    Forced sexual activity: Not on file  Other Topics Concern  . Not on file  Social History Narrative  . Not on file    Family History:    Family History  Problem Relation Age of Onset  . Hypertension Father      ROS:  Please see the history of present illness.   All other ROS reviewed and negative.     Physical Exam/Data:   Vitals:   11/13/17 0015 11/13/17 0028 11/13/17 0126 11/13/17 0640  BP: (!) 169/93  (!) 162/92 135/86  Pulse: (!) 58  (!) 51 (!) 45  Resp: 14  16 11   Temp:   98.5 F (36.9 C)   TempSrc:   Oral   SpO2: 99% 100% 100% 100%  Weight:   186 lb 9.6 oz (84.6 kg)   Height:   6' (1.829 m)     Intake/Output Summary (Last 24 hours) at 11/13/2017 0749 Last data filed at 11/12/2017 2337 Gross per 24 hour  Intake -  Output 550 ml  Net -550 ml   Filed Weights   11/12/17 2134 11/13/17 0126  Weight: 187 lb (84.8 kg) 186 lb 9.6 oz (84.6 kg)   Body mass index is 25.31 kg/m.  General:  Well nourished, well developed, laying in bed with intermitted chest pain HEENT: sclera anicteric  Neck: no JVD Vascular: No carotid bruits; distal pulses 2+ bilaterally Cardiac:   normal S1, S2; RRR; no murmurs, gallops, or rubs Lungs:  clear to auscultation bilaterally, no wheezing, rhonchi or rales  Abd: NABS, soft, nontender, no hepatomegaly Ext: no edema Musculoskeletal:  No deformities, BUE and BLE strength normal and equal Skin: warm and dry  Neuro:  CNs 2-12 intact, no focal abnormalities noted Psych:  Normal affect   EKG:  The EKG was personally reviewed and demonstrates:   sinus rhythm with ST-T wave abnormalities in precordial leads (no comparison).  Telemetry:  Telemetry  was personally reviewed and demonstrates:  Sinus bradycardia with occasional PVC  Relevant CV Studies: None  Laboratory Data:  Chemistry Recent Labs  Lab 11/12/17 2144  NA 139  K 4.1  CL 107  CO2 25  GLUCOSE 91  BUN 14  CREATININE 1.21  CALCIUM 9.0  GFRNONAA 60*  GFRAA >60  ANIONGAP 7    No results for input(s): PROT, ALBUMIN, AST, ALT, ALKPHOS, BILITOT in the last 168 hours. Hematology Recent Labs  Lab 11/12/17 2144  WBC 6.6  RBC 4.44  HGB 13.5  HCT 42.0  MCV 94.6  MCH 30.4  MCHC 32.1  RDW 13.7  PLT 170   Cardiac Enzymes Recent Labs  Lab 11/13/17 0122  TROPONINI 0.07*    Recent Labs  Lab 11/12/17 2201  TROPIPOC 0.08    BNPNo results for input(s): BNP, PROBNP in the last 168 hours.  DDimer No results for input(s): DDIMER in the last 168 hours.  Radiology/Studies:  Dg Chest 2 View  Result Date: 11/12/2017 CLINICAL DATA:  Acute chest pain for several hours. EXAM: CHEST - 2 VIEW COMPARISON:  01/04/2015 chest radiograph FINDINGS: The cardiomediastinal silhouette is unremarkable. There is no evidence of focal airspace disease, pulmonary edema, suspicious pulmonary nodule/mass, pleural effusion, or pneumothorax. No acute bony abnormalities are identified. IMPRESSION: No active cardiopulmonary disease. Electronically Signed   By: Harmon Pier M.D.   On: 11/12/2017 22:11    Assessment and Plan:   1. Chest pain: patient p/w sudden onset CP 11/11/17 while  doing house chores, a/w radiation of pain to left arm and SOB. Pain resolved spontaneously after a couple minutes but continued to reoccur over the next day, primarily at rest, and without identifiable aggravating or alleviating factors. Trop 0.08>0.07. EKG with ST-T wave abnormalities in precordial leads. CXR negative. Risk factors for ACS include HTN, HLD, and former smoker. He was given ASA and morphine in the ED with improvement in pain. Given ongoing pain and mild troponin elevations, definitely concerned for ACS. - Will plan for LHC today - keep NPO - Will start heparin gtt  - Will start nitro gtt given ongoing pain - Unable to start low dose BBlocker given bradycardia  2. HTN: BP elevated on presentation; improved this morning - Will hold home lisinopril in anticipation of LHC today - anticipate restarting post cath if Cr stable  3. HLD: no lipids on file - Continue atorvastatin and omega 3   For questions or updates, please contact [facility] Please consult [email or web address] for contact info under Cardiology/STEMI.   Signed, Beatriz Stallion, PA-C  11/13/2017 7:49 AM [phone]  I have seen and examined the patient along with Beatriz Stallion, PA-C .  I have reviewed the chart, notes and new data.  I agree with PA's note.  Key new complaints: Had steadily worsening chest pressure during our interview; improved with sublingual nitroglycerin Key examination changes: S4 present, otherwise normal cardiovascular exam Key new findings / data: ECG shows sinus bradycardia in a pattern that could be attributed to early repolarization due to the presence of J-point elevation, but is also highly suspicious for active ischemia.  Old ECG is not available for comparison.  He moved here from [place] roughly 3 years ago.  Minimal elevation in cardiac troponin I.  PLAN: Recommend early evaluation with coronary angiography.  We will start intravenous heparin and intravenous nitroglycerin. If we  do not identify any significant coronary disease, it's possible that his episodes of chest discomfort are related to spikes  in hypertension, but coronary insufficiency appears to be a more likely explanation for his presentation.  Thurmon Fair, MD, [facility] [phone] 11/13/2017, 9:50 AM

## 2017-11-13 NOTE — ED Notes (Addendum)
Pt c/o intermittent chest discomfort; rhythm strip pulled, showing occasional PVCs when pt has discomfort. Pt placed on 2L oxygen via [place]

## 2017-11-13 NOTE — Progress Notes (Signed)
Pt. complained of chest pain/pressure lasting about 1 min resolved on its on. He is on 2L nasal canula. Will continue to monitor.

## 2017-11-13 NOTE — Care Management Note (Signed)
Case Management Note  Patient Details  Name: Fredirick LatheCharles Kimmet MRN: [id number] Date of Birth: 05/10/1951  Subjective/Objective:  From home, s/p stent intervention, will be on brilinta, NCM awaiting benefit check, RN will give patient the 30 day savings coupon.                    Action/Plan: DC home when ready.  Expected Discharge Date:                  Expected Discharge Plan:  Home/Self Care  In-House Referral:     Discharge planning Services  CM Consult, Medication Assistance  Post Acute Care Choice:    Choice offered to:     DME Arranged:    DME Agency:     HH Arranged:    HH Agency:     Status of Service:  Completed, signed off  If discussed at [employer] Length of Stay Meetings, dates discussed:    Additional Comments:  Leone Havenaylor, Tyeasha Ebbs [place], RN 11/13/2017, 12:25 PM

## 2017-11-13 NOTE — Progress Notes (Signed)
ANTICOAGULATION CONSULT NOTE - Initial Consult  Pharmacy Consult for heparin Indication: chest pain/ACS  No Known Allergies  Patient Measurements: Height: 6' (182.9 cm) Weight: 186 lb 9.6 oz (84.6 kg) IBW/kg (Calculated) : 77.6 Heparin Dosing Weight: 85kg  Vital Signs: Temp: 98.1 F (36.7 C) (07/03 0900) Temp Source: Oral (07/03 0900) BP: 144/78 (07/03 0900) Pulse Rate: 52 (07/03 0900)  Labs: Recent Labs    11/12/17 2144 11/13/17 0122  HGB 13.5  --   HCT 42.0  --   PLT 170  --   CREATININE 1.21  --   TROPONINI  --  0.07*    Estimated Creatinine Clearance: 64.1 mL/min (by C-G formula based on SCr of 1.21 mg/dL).   Assessment: [age] YOM with chest pain to start heparin. He is not on anticoagulation PTA. With elevated troponin. Plan is for him to have LHC today. Baseline CBC is wnl- no bleeding noted.  Goal of Therapy:  Heparin level 0.3-0.7 units/ml Monitor platelets by anticoagulation protocol: Yes   Plan:  Heparin bolus 4000 units IV x1, then start infusion at 1150 units/hr Heparin level in 6h Daily heparin level and CBC Follow cardiology plans  Khyra Viscuso D. Angellica Maddison, PharmD, BCPS Clinical Pharmacist [phone] Please check AMION for all [facility] numbers 11/13/2017 9:29 AM

## 2017-11-13 NOTE — H&P (Signed)
History and Physical    Herbie Lehrmann ZOX:[id number] DOB: [birth year] DOA: 11/12/2017  PCP: Yolanda Manges, DO   Patient coming from: Home   Chief Complaint: Chest pain   HPI: Kevin Wade is a 67 y.o. male with medical history significant for hypertension and hyperlipidemia, now presenting to the emergency department for evaluation of chest pain.  Patient reports that he been in his usual state of health until yesterday when he was working around the house and developed acute onset of pressure in the left chest with radiation to the left arm and shortness of breath.  Symptoms resolved spontaneously within a couple minutes and there was no associated nausea or diaphoresis.  He had never experienced these symptoms previously.  There has not been any recent cough, fevers, or chills.  He has gone on to experience recurrence in the symptoms, mainly at rest now, also lasting approximately 1 to 3 minutes, and without any alleviating or exacerbating factors identified.  ED Course: Upon arrival to the ED, patient is found to be afebrile, saturating well on room air, bradycardic in the 50s, and mildly hypertensive.  EKG features a sinus or ectopic rhythm with rate 55 and nonspecific ST abnormality in the precordial leads.  Chest x-ray is negative for acute cardiopulmonary disease, chemistry panel and CBC are unremarkable, and initial troponin is normal.  Patient was treated with 162 mg of aspirin and 4 mg IV morphine in the ED.  On-call cardiologist reviewed the EKG and case over the phone, assistance much appreciated.  The patient remains hemodynamically stable, in no apparent respiratory distress, has been experiencing recurrent symptoms in the ED, and will be observed in the stepdown unit for ongoing evaluation and management of this.  Review of Systems:  All other systems reviewed and apart from HPI, are negative.  Past Medical History:  Diagnosis Date  . Arthritis   . Hyperlipidemia   .  Hypertension     Past Surgical History:  Procedure Laterality Date  . APPENDECTOMY       reports that he has never smoked. He has never used smokeless tobacco. He reports that he drinks alcohol. He reports that he does not use drugs.  No Known Allergies  Family History  Problem Relation Age of Onset  . Hypertension Father      Prior to Admission medications   Not on File    Physical Exam: Vitals:   11/12/17 2315 11/12/17 2345 11/13/17 0015 11/13/17 0028  BP: (!) 146/85 (!) 165/89 (!) 169/93   Pulse: (!) 50 (!) 50 (!) 58   Resp: 13 15 14    Temp:      TempSrc:      SpO2: 98% 100% 99% 100%  Weight:      Height:          Constitutional: NAD, calm  Eyes: PERTLA, lids and conjunctivae normal ENMT: Mucous membranes are moist. Posterior pharynx clear of any exudate or lesions.   Neck: normal, supple, no masses, no thyromegaly Respiratory: clear to auscultation bilaterally, no wheezing, no crackles. Normal respiratory effort.   Cardiovascular: S1 & S2 heard, regular rate and rhythm. No extremity edema. No significant JVD. Abdomen: No distension, no tenderness, soft. Bowel sounds normal.  Musculoskeletal: no clubbing / cyanosis. No joint deformity upper and lower extremities.    Skin: no significant rashes, lesions, ulcers. Warm, dry, well-perfused. Neurologic: CN 2-12 grossly intact. Sensation intact. Strength 5/5 in all 4 limbs.  Psychiatric: Alert and oriented x 3. Calm, cooperative.  Labs on Admission: I have personally reviewed following labs and imaging studies  CBC: Recent Labs  Lab 11/12/17 2144  WBC 6.6  HGB 13.5  HCT 42.0  MCV 94.6  PLT 170   Basic Metabolic Panel: Recent Labs  Lab 11/12/17 2144  NA 139  K 4.1  CL 107  CO2 25  GLUCOSE 91  BUN 14  CREATININE 1.21  CALCIUM 9.0   GFR: Estimated Creatinine Clearance: 64.1 mL/min (by C-G formula based on SCr of 1.21 mg/dL). Liver Function Tests: No results for input(s): AST, ALT, ALKPHOS,  BILITOT, PROT, ALBUMIN in the last 168 hours. No results for input(s): LIPASE, AMYLASE in the last 168 hours. No results for input(s): AMMONIA in the last 168 hours. Coagulation Profile: No results for input(s): INR, PROTIME in the last 168 hours. Cardiac Enzymes: No results for input(s): CKTOTAL, CKMB, CKMBINDEX, TROPONINI in the last 168 hours. BNP (last 3 results) No results for input(s): PROBNP in the last 8760 hours. HbA1C: No results for input(s): HGBA1C in the last 72 hours. CBG: No results for input(s): GLUCAP in the last 168 hours. Lipid Profile: No results for input(s): CHOL, HDL, LDLCALC, TRIG, CHOLHDL, LDLDIRECT in the last 72 hours. Thyroid Function Tests: No results for input(s): TSH, T4TOTAL, FREET4, T3FREE, THYROIDAB in the last 72 hours. Anemia Panel: No results for input(s): VITAMINB12, FOLATE, FERRITIN, TIBC, IRON, RETICCTPCT in the last 72 hours. Urine analysis: No results found for: COLORURINE, APPEARANCEUR, LABSPEC, PHURINE, GLUCOSEU, HGBUR, BILIRUBINUR, KETONESUR, PROTEINUR, UROBILINOGEN, NITRITE, LEUKOCYTESUR Sepsis Labs: @LABRCNTIP (procalcitonin:4,lacticidven:4) )No results found for this or any previous visit (from the past 240 hour(s)).   Radiological Exams on Admission: Dg Chest 2 View  Result Date: 11/12/2017 CLINICAL DATA:  Acute chest pain for several hours. EXAM: CHEST - 2 VIEW COMPARISON:  01/04/2015 chest radiograph FINDINGS: The cardiomediastinal silhouette is unremarkable. There is no evidence of focal airspace disease, pulmonary edema, suspicious pulmonary nodule/mass, pleural effusion, or pneumothorax. No acute bony abnormalities are identified. IMPRESSION: No active cardiopulmonary disease. Electronically Signed   By: [name]  Hu M.D.   On: 11/12/2017 22:11    EKG: Independently reviewed. Sinus or ectopic rhythm, rate 55, non-specific ST abnormality in precordial leads.   Assessment/Plan   1. Chest pain  - Presents with 2 days of intermittent  chest pain, both typical and atypical features  - Has risk-factors including HTN and HLD, but no known hx of CAD  - Initial troponin wnl, CXR unremarkable, EKG with non-specific ST abnormalities reviewed with cardiology and not felt to be ischemic  - Treated with ASA 162 mg and IV morphine in ED  - Continue cardiac monitoring, obtain serial troponin measurements, continue prn NTG, prn morphine, ASA, statin, and ACE; beta-blocker precluded by bradycardia    2. Hypertension  - BP mildly elevated in ED  - Continue lisinopril   3. Hyperlipidemia  - Continue Lipitor and omega-3's     DVT prophylaxis: Lovenox Code Status: Full  Family Communication: Wife updated at bedside Consults called: Reviewed case with cardiology, not formally consulted  Admission status: Observation     Briscoe Deutscherimothy S Opyd, MD [facility] Pager [phone]  If 7PM-7AM, please contact night-coverage [email or web address] Password [email or web address]  11/13/2017, 12:42 AM

## 2017-11-13 NOTE — Progress Notes (Signed)
4. S/W AMBER @ [facility] [address] # [phone]    BRILINTA 90 MG BID  CO-PAY $ 24.99   PREFERRED PHARMACY : YES [facility]   DEMOGRAPHIC SHEET SHOW PRIMARY INS : [facility]

## 2017-11-13 NOTE — Progress Notes (Signed)
PROGRESS NOTE    Kevin Wade  ZOX:[id number] DOB: April 29, 1951 DOA: 11/12/2017 PCP: Yolanda MangesWilson, Alex M, DO    Brief Narrative:  67 year old male who presented with chest pain.  He does have the significant past medical history for hypertension, dyslipidemia and history of tobacco abuse (smoking).  Patient complained of intermittent chest pain for the last 48 hours.  Pain is precordial, burning type, moderate to severe in intensity, associated with dyspnea, and radiation to the left shoulder and left arm.  It has been exertion related, improved with rest. First episode occurred at night while sitting, but he did have increased physical exertion during the day, then he had repeat episodes of following day, worse with exertion and with increase intensity.  Due to persistent symptoms he came to the emergency room.  On the initial physical examination blood pressure 146/85, heart rate 50, respiratory rate 13, oxygen saturation 98%, moist mucous membranes, lungs clear to auscultation bilaterally, heart S1-S2 present and rhythmic, the abdomen was soft nontender, no lower extremity edema.  Sodium 139, potassium 4.1, chloride 107, bicarb 25, glucose 91, BUN 14, creatinine 1.21, point-of-care troponin 0.08, white count 6.6, hemoglobin 13.5, hematocrit 42.0, platelets 170.  Chest x-ray negative for infiltrates.  EKG with sinus bradycardia, normal axis, concave ST elevations in the inferior lateral leads.   Patient  was admitted to the hospital with working the diagnosis of chest pain to rule out acute coronary syndrome.    Assessment & Plan:   Principal Problem:   Chest pain Active Problems:   Hypertension   Hyperlipidemia   Bradycardia   1. Chest pain, unstable angina. Patient had recurrent chest pain with angina characteristics, unstable angina, his troponin is up to 0.07 and his ekg has inferior lateral concave st elevations, no old ekg to compare with. Had pain last night. Patient will benefit from  invasive testing, coronary angiography, will follow with cardiology recommendations. For now will add saline infusion due to elevated creatinine, continue pain control with nitroglycerin and morphine. Continue antiplatelet therapy with aspirin and statin therapy with atorvastatin, hold on b blockade due to bradycardia. Continue lisinopril 20 mg daily. If next troponin continue to rise, will add heparin drip. Keep patient NPO for now.  2. HTN. Will continue blood pressure control with lisinopril. No b blockade due to bradycardia.   3. Dyslipidemia. Continue atorvastatin, follow on renal panel in am.   4. CKD stage 2. Will add IV saline at 100 ml in preparation for possible contrast exposure. Follow renal panel in am.      DVT prophylaxis: enoxaparin   Code Status: full Family Communication: I spoke with patient's wife at the bedside and all questions were addressed.  Disposition Plan: pending cardiac workup.    Consultants:   Cardiology   Procedures:     Antimicrobials:       Subjective: Patient had burning chest pain this am, moderate in intensity, transitory, brief, with no radiation. No nausea or vomiting.   Objective: Vitals:   11/13/17 0015 11/13/17 0028 11/13/17 0126 11/13/17 0640  BP: (!) 169/93  (!) 162/92 135/86  Pulse: (!) 58  (!) 51 (!) 45  Resp: 14  16 11   Temp:   98.5 F (36.9 C)   TempSrc:   Oral   SpO2: 99% 100% 100% 100%  Weight:   84.6 kg (186 lb 9.6 oz)   Height:   6' (1.829 m)     Intake/Output Summary (Last 24 hours) at 11/13/2017 [id number] Last data filed at  11/12/2017 2337 Gross per 24 hour  Intake -  Output 550 ml  Net -550 ml   Filed Weights   11/12/17 2134 11/13/17 0126  Weight: 84.8 kg (187 lb) 84.6 kg (186 lb 9.6 oz)    Examination:   General: Not in pain or dyspnea  Neurology: Awake and alert, non focal  E ENT: no pallor, no icterus, oral mucosa moist Cardiovascular: No JVD. S1-S2 present, rhythmic, no gallops, rubs, or murmurs. No  lower extremity edema. Pulmonary: vesicular breath sounds bilaterally, adequate air movement, no wheezing, rhonchi or rales. Gastrointestinal. Abdomen with no organomegaly, non tender, no rebound or guarding Skin. No rashes Musculoskeletal: no joint deformities     Data Reviewed: I have personally reviewed following labs and imaging studies  CBC: Recent Labs  Lab 11/12/17 2144  WBC 6.6  HGB 13.5  HCT 42.0  MCV 94.6  PLT 170   Basic Metabolic Panel: Recent Labs  Lab 11/12/17 2144  NA 139  K 4.1  CL 107  CO2 25  GLUCOSE 91  BUN 14  CREATININE 1.21  CALCIUM 9.0   GFR: Estimated Creatinine Clearance: 64.1 mL/min (by C-G formula based on SCr of 1.21 mg/dL). Liver Function Tests: No results for input(s): AST, ALT, ALKPHOS, BILITOT, PROT, ALBUMIN in the last 168 hours. No results for input(s): LIPASE, AMYLASE in the last 168 hours. No results for input(s): AMMONIA in the last 168 hours. Coagulation Profile: No results for input(s): INR, PROTIME in the last 168 hours. Cardiac Enzymes: Recent Labs  Lab 11/13/17 0122  TROPONINI 0.07*   BNP (last 3 results) No results for input(s): PROBNP in the last 8760 hours. HbA1C: No results for input(s): HGBA1C in the last 72 hours. CBG: No results for input(s): GLUCAP in the last 168 hours. Lipid Profile: No results for input(s): CHOL, HDL, LDLCALC, TRIG, CHOLHDL, LDLDIRECT in the last 72 hours. Thyroid Function Tests: No results for input(s): TSH, T4TOTAL, FREET4, T3FREE, THYROIDAB in the last 72 hours. Anemia Panel: No results for input(s): VITAMINB12, FOLATE, FERRITIN, TIBC, IRON, RETICCTPCT in the last 72 hours.    Radiology Studies: I have reviewed all of the imaging during this hospital visit personally     Scheduled Meds: . [START ON 11/14/2017] aspirin EC  81 mg Oral Daily  . atorvastatin  20 mg Oral q1800  . enoxaparin (LOVENOX) injection  40 mg Subcutaneous QHS  . lisinopril  20 mg Oral Daily  . omega-3  acid ethyl esters  2 g Oral Daily   Continuous Infusions:   LOS: 0 days        Mauricio Annett Gula, MD [facility] Pager [phone]

## 2017-11-14 DIAGNOSIS — E785 Hyperlipidemia, unspecified: Secondary | ICD-10-CM

## 2017-11-14 DIAGNOSIS — I214 Non-ST elevation (NSTEMI) myocardial infarction: Principal | ICD-10-CM

## 2017-11-14 DIAGNOSIS — I208 Other forms of angina pectoris: Secondary | ICD-10-CM

## 2017-11-14 DIAGNOSIS — Z955 Presence of coronary angioplasty implant and graft: Secondary | ICD-10-CM

## 2017-11-14 DIAGNOSIS — R001 Bradycardia, unspecified: Secondary | ICD-10-CM

## 2017-11-14 LAB — LIPID PANEL
CHOL/HDL RATIO: 3 ratio
CHOLESTEROL: 143 mg/dL (ref 0–200)
HDL: 47 mg/dL (ref >40)
LDL CALC: 80 mg/dL (ref 0–99)
TRIGLYCERIDES: 82 mg/dL (ref <150)
VLDL: 16 mg/dL (ref 0–40)

## 2017-11-14 LAB — BASIC METABOLIC PANEL
Anion gap: 5 (ref 5–15)
BUN: 14 mg/dL (ref 8–23)
CALCIUM: 9 mg/dL (ref 8.9–10.3)
CO2: 25 mmol/L (ref 22–32)
CREATININE: 1.22 mg/dL (ref 0.61–1.24)
Chloride: 109 mmol/L (ref 98–111)
GFR calc Af Amer: >60 mL/min (ref >60)
GFR, EST NON AFRICAN AMERICAN: 59 mL/min — AB (ref >60)
GLUCOSE: 90 mg/dL (ref 70–99)
Potassium: 4.1 mmol/L (ref 3.5–5.1)
Sodium: 139 mmol/L (ref 135–145)

## 2017-11-14 LAB — CBC
HCT: 40.4 % (ref 39.0–52.0)
Hemoglobin: 12.9 g/dL — ABNORMAL LOW (ref 13.0–17.0)
MCH: 30.4 pg (ref 26.0–34.0)
MCHC: 31.9 g/dL (ref 30.0–36.0)
MCV: 95.3 fL (ref 78.0–100.0)
PLATELETS: 159 10*3/uL (ref 150–400)
RBC: 4.24 MIL/uL (ref 4.22–5.81)
RDW: 13.7 % (ref 11.5–15.5)
WBC: 9.1 10*3/uL (ref 4.0–10.5)

## 2017-11-14 MED ORDER — TICAGRELOR 90 MG PO TABS: 90.0000 mg | ORAL_TABLET | Freq: Two times a day (BID) | ORAL | 0 refills | Status: DC

## 2017-11-14 MED ORDER — NITROGLYCERIN 0.4 MG SL SUBL: 0.4000 mg | SUBLINGUAL_TABLET | SUBLINGUAL | 0 refills | Status: AC | PRN

## 2017-11-14 MED ORDER — ATORVASTATIN CALCIUM 80 MG PO TABS: 80.0000 mg | ORAL_TABLET | Freq: Every day | ORAL | 0 refills | Status: DC

## 2017-11-14 NOTE — Progress Notes (Signed)
Progress Note  Patient Name: Kevin Wade Date of Encounter: 11/14/2017  Primary Cardiologist: New to [facility]; Dr. Royann Shiversroitoru    Subjective   Doing well. Wants to go home. Wife present. Denies chest pain, palpitations and shortness of breath.  Inpatient Medications    Scheduled Meds: . aspirin EC  81 mg Oral Daily  . atorvastatin  80 mg Oral q1800  . famotidine  40 mg Oral Daily  . lisinopril  20 mg Oral Daily  . omega-3 acid ethyl esters  2 g Oral Daily  . sodium chloride flush  3 mL Intravenous Q12H  . ticagrelor  90 mg Oral BID   Continuous Infusions: . sodium chloride     PRN Meds: sodium chloride, acetaminophen, ALPRAZolam, morphine injection, nitroGLYCERIN, ondansetron (ZOFRAN) IV, oxyCODONE, sodium chloride flush   Vital Signs    Vitals:   11/13/17 2200 11/13/17 2300 11/14/17 0300 11/14/17 0734  BP: (!) 107/54 (!) 104/55 120/60 129/64  Pulse:   (!) 57 (!) 58  Resp: (!) 25 11 15 16   Temp:   98.1 F (36.7 C) 98.3 F (36.8 C)  TempSrc:   Oral Oral  SpO2:   98% 100%  Weight:   185 lb 11.2 oz (84.2 kg)   Height:        Intake/Output Summary (Last 24 hours) at 11/14/2017 0923 Last data filed at 11/14/2017 0841 Gross per 24 hour  Intake 815.61 ml  Output 1730 ml  Net -914.39 ml   Filed Weights   11/12/17 2134 11/13/17 0126 11/14/17 0300  Weight: 187 lb (84.8 kg) 186 lb 9.6 oz (84.6 kg) 185 lb 11.2 oz (84.2 kg)    Telemetry    Sinus bradycardia with PVC's - Personally Reviewed  ECG    Sinus bradycardia with TWI's in III and aVF with precordial ST elevations - Personally Reviewed  Physical Exam   GEN: No acute distress.   Neck: No JVD Cardiac: Bradycardic, regular rhythm, no murmurs, rubs, or gallops.  Respiratory: Clear to auscultation bilaterally. GI: Soft, nontender, non-distended  MS: No edema; No deformity. Neuro:  Nonfocal  Psych: Normal affect   Labs    Chemistry Recent Labs  Lab 11/12/17 2144 11/13/17 0931 11/14/17 0322    NA 139 141 139  K 4.1 4.0 4.1  CL 107 107 109  CO2 25 24 25   GLUCOSE 91 94 90  BUN 14 10 14   CREATININE 1.21 1.09 1.22  CALCIUM 9.0 9.2 9.0  GFRNONAA 60* >60 59*  GFRAA >60 >60 >60  ANIONGAP 7 10 5      Hematology Recent Labs  Lab 11/12/17 2144 11/14/17 0322  WBC 6.6 9.1  RBC 4.44 4.24  HGB 13.5 12.9*  HCT 42.0 40.4  MCV 94.6 95.3  MCH 30.4 30.4  MCHC 32.1 31.9  RDW 13.7 13.7  PLT 170 159    Cardiac Enzymes Recent Labs  Lab 11/13/17 0122 11/13/17 0931 11/13/17 1313  TROPONINI 0.07* 0.12* 0.26*    Recent Labs  Lab 11/12/17 2201  TROPIPOC 0.08     BNPNo results for input(s): BNP, PROBNP in the last 168 hours.   DDimer No results for input(s): DDIMER in the last 168 hours.   Radiology    Dg Chest 2 View  Result Date: 11/12/2017 CLINICAL DATA:  Acute chest pain for several hours. EXAM: CHEST - 2 VIEW COMPARISON:  01/04/2015 chest radiograph FINDINGS: The cardiomediastinal silhouette is unremarkable. There is no evidence of focal airspace disease, pulmonary edema, suspicious pulmonary nodule/mass, pleural  effusion, or pneumothorax. No acute bony abnormalities are identified. IMPRESSION: No active cardiopulmonary disease. Electronically Signed   By: Harmon Pier M.D.   On: 11/12/2017 22:11    Cardiac Studies   Cardiac Cath (11/13/17)   Mid RCA lesion is 95% stenosed.  Ost Cx to Prox Cx lesion is 40% stenosed.  Prox Cx to Mid Cx lesion is 30% stenosed.  Prox RCA lesion is 30% stenosed.  A drug-eluting stent was successfully placed using a STENT SIERRA 3.50 X 28 MM.  Post intervention, there is a 0% residual stenosis.  The left ventricular systolic function is normal.  The left ventricular ejection fraction is 55-65% by visual estimate.   1.  Severe single-vessel coronary artery disease with severe stenosis of the mid RCA, treated successfully with PCI using a 3.5 x 28 mm Xience Sierra DES 2.  Mild nonobstructive disease of the LAD and left  circumflex 3.  Mild segmental contraction abnormality left ventricle with preserved overall LV function, LVEF estimated at 55 to 65%  Recommend uninterrupted dual antiplatelet therapy with Aspirin 81mg  daily and Ticagrelor 90mg  twice daily for a minimum of 12 months (ACS - Class I recommendation).  Patient Profile     67 y.o. male with HTN and HLD admitted with chest pain and found to have NSTEMI.  Assessment & Plan    1. NSTEMI s/p drug eluting stent to the mid RCA: Symptomatically stable. Continue DAPT with ASA and Brilinta 90 mg bid for a minimum of 12 months. On lisinopril. No beta blockers due to resting bradycardia. LVEF is normal. Will arrange for outpatient cardiac rehabilitation.  2. Hypertension: BP is normal. On lisinopril. BP is normal. No changes.  3. Hyperlipidemia: LDL 80. Had been on Lipitor 20 mg at home, increased to 80 mg while hospitalized.  4. Bradycardia: Asymptomatic. Currently in 50 bpm range. No beta blockers.   Disposition: Stable for discharge.  For questions or updates, please contact [facility] Please consult [email or web address] for contact info under Cardiology/STEMI.      Signed, Prentice Docker, MD  11/14/2017, 9:23 AM

## 2017-11-14 NOTE — Discharge Summary (Signed)
Physician Discharge Summary  Kevin Wade YQM:[id number] DOB: 1950/12/07 DOA: 11/12/2017  PCP: Yolanda Manges, DO  Admit date: 11/12/2017 Discharge date: 11/14/2017  Admitted From: Home  Disposition:  Home   Recommendations for Outpatient Follow-up and new medication changes:  1. Follow up with Dr. [name] in 7 days 2. Atorvastatin has been increased to 80 mg daily 3. Added ticagrelor and continue aspirin 4. Started on as needed nitroglycerin  Home Health: no   Equipment/Devices: no   Discharge Condition: stable  CODE STATUS: full  Diet recommendation: Heart healthy   Brief/Interim Summary: 67 year old male who presented with chest pain.  He does have the significant past medical history for hypertension, dyslipidemia and history of tobacco abuse (smoking).  Patient complained of intermittent chest pain for the last 48 hours.  Pain was precordial, burning type, moderate to severe in intensity, associated with dyspnea, and radiation to the left shoulder and left arm.  It was exertion related, improved with rest. First episode occurred at night while sitting, but he did have increased physical exertion during the day, then he had repeat episodes of following day, worse with exertion and with increased intensity.  Due to persistent symptoms he came to the emergency room.  On the initial physical examination blood pressure 146/85, heart rate 50, respiratory rate 13, oxygen saturation 98%, moist mucous membranes, lungs clear to auscultation bilaterally, heart S1-S2 present and rhythmic, the abdomen was soft nontender, no lower extremity edema.  Sodium 139, potassium 4.1, chloride 107, bicarb 25, glucose 91, BUN 14, creatinine 1.21, point-of-care troponin 0.08, white count 6.6, hemoglobin 13.5, hematocrit 42.0, platelets 170.  Chest x-ray negative for infiltrates.  EKG with sinus bradycardia, normal axis, concave ST elevations in the inferior- lateral leads.   Patient  was admitted to the hospital  with working the diagnosis of acute chest pain to rule out acute coronary syndrome.  1.  Non-ST elevation myocardial infarction.  Patient was admitted to the telemetry unit, he ruled in for acute coronary syndrome, peak troponin 0.26 with persistent concave ST elevations in the septal -lateral precordial leads he was placed on intravenous heparin, antiplatelet therapy and statin. He underwent early PCI, coronary angiography showed severe single-vessel coronary disease with severe stenosis of the mid RCA, successfully treated with PCI, drug-eluting stent Kevin Wade).  Mild nonobstructive disease of the LAD and left circumflex.  Preserved LV function with ejection fraction 55 to 65%.  He will continue dual antiplatelet therapy for 12 months, and atorvastatin has been increased to 80 mg daily.  Continue lisinopril for ACE inhibitor, no beta-blockade due to bradycardia.  2.  Hypertension.  Continue blood pressure control with lisinopril.  3.  Dyslipidemia.  Atorvastatin has been increased to 80 mg, his LDL was 80 and HDL 47.   4.  Chronic kidney disease stage II.  Patient received isotonic saline before cardiac catheterization, his discharge creatinine is 1.22.  To continue ACE inhibitors (lisinopril), will need close follow-up as an outpatient.  Discharge Diagnoses:  Principal Problem:   Chest pain Active Problems:   Hypertension   Hyperlipidemia   Bradycardia   Angina pectoris syndrome [facility])    Discharge Instructions  Discharge Instructions    Amb Referral to Cardiac Rehabilitation   Complete by:  As directed    Diagnosis:   Coronary Stents NSTEMI       Allergies as of 11/14/2017   No Known Allergies     Medication List    TAKE these medications   aspirin EC 81  MG tablet Take 81 mg by mouth daily.   atorvastatin 80 MG tablet Commonly known as:  LIPITOR Take 1 tablet (80 mg total) by mouth daily at 6 PM. What changed:    medication strength  how much to take  when to  take this   lisinopril 20 MG tablet Commonly known as:  PRINIVIL,ZESTRIL Take 20 mg by mouth.   nitroGLYCERIN 0.4 MG SL tablet Commonly known as:  NITROSTAT Place 1 tablet (0.4 mg total) under the tongue every 5 (five) minutes x 3 doses as needed for chest pain.   Omega-3 1000 MG Caps Take 1,000 mg by mouth daily.   ONE-A-DAY MENS 50+ ADVANTAGE Tabs Take 1 tablet by mouth daily.   OSTEO BI-FLEX JOINT SHIELD Tabs Take 1 tablet by mouth daily.   ticagrelor 90 MG Tabs tablet Commonly known as:  BRILINTA Take 1 tablet (90 mg total) by mouth 2 (two) times daily.       No Known Allergies  Consultations:  Cardiology    Procedures/Studies: Dg Chest 2 View  Result Date: 11/12/2017 CLINICAL DATA:  Acute chest pain for several hours. EXAM: CHEST - 2 VIEW COMPARISON:  01/04/2015 chest radiograph FINDINGS: The cardiomediastinal silhouette is unremarkable. There is no evidence of focal airspace disease, pulmonary edema, suspicious pulmonary nodule/mass, pleural effusion, or pneumothorax. No acute bony abnormalities are identified. IMPRESSION: No active cardiopulmonary disease. Electronically Signed   By: [name]  Hu M.D.   On: 11/12/2017 22:11       Subjective: Patient is feeling better, no chest pain, no dyspnea, no nausea or vomiting, has been out of bed with no complications.   Discharge Exam: Vitals:   11/14/17 0300 11/14/17 0734  BP: 120/60 129/64  Pulse: (!) 57 (!) 58  Resp: 15 16  Temp: 98.1 F (36.7 C) 98.3 F (36.8 C)  SpO2: 98% 100%   Vitals:   11/13/17 2200 11/13/17 2300 11/14/17 0300 11/14/17 0734  BP: (!) 107/54 (!) 104/55 120/60 129/64  Pulse:   (!) 57 (!) 58  Resp: (!) 25 11 15 16   Temp:   98.1 F (36.7 C) 98.3 F (36.8 C)  TempSrc:   Oral Oral  SpO2:   98% 100%  Weight:   84.2 kg (185 lb 11.2 oz)   Height:        General: Not in pain or dyspnea  Neurology: Awake and alert, non focal  E ENT: no pallor, no icterus, oral mucosa  moist Cardiovascular: No JVD. S1-S2 present, rhythmic, bradycardic,no gallops, rubs, or murmurs. No lower extremity edema. Pulmonary: vesicular breath sounds bilaterally, adequate air movement, no wheezing, rhonchi or rales. Gastrointestinal. Abdomen flat, no organomegaly, non tender, no rebound or guarding Skin. No rashes Musculoskeletal: no joint deformities   The results of significant diagnostics from this hospitalization (including imaging, microbiology, ancillary and laboratory) are listed below for reference.     Microbiology: Recent Results (from the past 240 hour(s))  MRSA PCR Screening     Status: None   Collection Time: 11/13/17  1:18 AM  Result Value Ref Range Status   MRSA by PCR NEGATIVE NEGATIVE Final    Comment:        The GeneXpert MRSA Assay (FDA approved for NASAL specimens only), is one component of a comprehensive MRSA colonization surveillance program. It is not intended to diagnose MRSA infection nor to guide or monitor treatment for MRSA infections. Performed at [facility], [address] [place], [place] [address]  Labs: BNP (last 3 results) No results for input(s): BNP in the last 8760 hours. Basic Metabolic Panel: Recent Labs  Lab 11/12/17 2144 11/13/17 0931 11/14/17 0322  NA 139 141 139  K 4.1 4.0 4.1  CL 107 107 109  CO2 25 24 25   GLUCOSE 91 94 90  BUN 14 10 14   CREATININE 1.21 1.09 1.22  CALCIUM 9.0 9.2 9.0   Liver Function Tests: No results for input(s): AST, ALT, ALKPHOS, BILITOT, PROT, ALBUMIN in the last 168 hours. No results for input(s): LIPASE, AMYLASE in the last 168 hours. No results for input(s): AMMONIA in the last 168 hours. CBC: Recent Labs  Lab 11/12/17 2144 11/14/17 0322  WBC 6.6 9.1  HGB 13.5 12.9*  HCT 42.0 40.4  MCV 94.6 95.3  PLT 170 159   Cardiac Enzymes: Recent Labs  Lab 11/13/17 0122 11/13/17 0931 11/13/17 1313  TROPONINI 0.07* 0.12* 0.26*   BNP: Invalid input(s): POCBNP CBG: No  results for input(s): GLUCAP in the last 168 hours. D-Dimer No results for input(s): DDIMER in the last 72 hours. Hgb A1c No results for input(s): HGBA1C in the last 72 hours. Lipid Profile Recent Labs    11/14/17 0322  CHOL 143  HDL 47  LDLCALC 80  TRIG 82  CHOLHDL 3.0   Thyroid function studies No results for input(s): TSH, T4TOTAL, T3FREE, THYROIDAB in the last 72 hours.  Invalid input(s): FREET3 Anemia work up No results for input(s): VITAMINB12, FOLATE, FERRITIN, TIBC, IRON, RETICCTPCT in the last 72 hours. Urinalysis No results found for: COLORURINE, APPEARANCEUR, LABSPEC, PHURINE, GLUCOSEU, HGBUR, BILIRUBINUR, KETONESUR, PROTEINUR, UROBILINOGEN, NITRITE, LEUKOCYTESUR Sepsis Labs Invalid input(s): PROCALCITONIN,  WBC,  LACTICIDVEN Microbiology Recent Results (from the past 240 hour(s))  MRSA PCR Screening     Status: None   Collection Time: 11/13/17  1:18 AM  Result Value Ref Range Status   MRSA by PCR NEGATIVE NEGATIVE Final    Comment:        The GeneXpert MRSA Assay (FDA approved for NASAL specimens only), is one component of a comprehensive MRSA colonization surveillance program. It is not intended to diagnose MRSA infection nor to guide or monitor treatment for MRSA infections. Performed at [facility], [address] [place], [place] [address]      Time coordinating discharge: 45 minutes  SIGNED:   Coralie Keens, MD  [facility] 11/14/2017, 9:15 AM Pager [phone]  If 7PM-7AM, please contact night-coverage [email or web address] Password [email or web address]

## 2017-11-14 NOTE — Care Management Note (Signed)
Case Management Note  Patient Details  Name: Kevin Wade MRN: [id number] Date of Birth: [birth year]  Subjective/Objective:                    Action/Plan: Pt discharging home with self care. CM provided patient with 30 day free Brilinta card. Pt aware of co pay after initial 30 days and is able to afford this.  PCP: Dr Andrey CampanileWilson Insurance: Medicare Pt has transportation home.  Expected Discharge Date:  11/14/17               Expected Discharge Plan:  Home/Self Care  In-House Referral:     Discharge planning Services  CM Consult, Medication Assistance  Post Acute Care Choice:    Choice offered to:     DME Arranged:    DME Agency:     HH Arranged:    HH Agency:     Status of Service:  Completed, signed off  If discussed at [employer] Length of Stay Meetings, dates discussed:    Additional Comments:  Kermit BaloKelli F Kymoni Monday, RN 11/14/2017, 10:22 AM

## 2017-11-15 MED FILL — Lidocaine HCl Local Preservative Free (PF) Inj 1%: INTRAMUSCULAR | Qty: 30 | Status: AC

## 2017-11-15 MED FILL — Heparin Sod (Porcine)-NaCl IV Soln 2000 Unit/L-0.9%: INTRAVENOUS | Qty: 1000 | Status: AC

## 2017-11-18 ENCOUNTER — Telehealth (HOSPITAL_COMMUNITY): Payer: Self-pay

## 2017-11-18 NOTE — Telephone Encounter (Signed)
Patients insurance is active and benefits verified through Medicare A/B - No co-pay, deductible amount of $185.00/$185.00 has been met, no out of pocket, 20% co-insurance, and no pre-authorization is required. Passport/reference [phone]  Will contact patient to see if he is interested in the Cardiac Rehab Program. If interested, patient will need to complete follow up appt. Once completed, patient will be contacted for scheduling upon review by the RN navigator.

## 2017-11-18 NOTE — Telephone Encounter (Signed)
Called and spoke with wife of patient to see if patient is interested in the Cardiac Rehab program - wife stated he is interested. Explained scheduling process and wife verbalized understanding. Will contact patient for scheduling upon review by the RN Navigator once follow up has been completed.

## 2017-11-19 DIAGNOSIS — I214 Non-ST elevation (NSTEMI) myocardial infarction: Secondary | ICD-10-CM

## 2017-11-19 HISTORY — DX: Non-ST elevation (NSTEMI) myocardial infarction: I21.4

## 2017-11-22 ENCOUNTER — Encounter: Payer: Self-pay | Admitting: Cardiology

## 2017-11-22 ENCOUNTER — Ambulatory Visit (INDEPENDENT_AMBULATORY_CARE_PROVIDER_SITE_OTHER): Payer: Medicare Other | Admitting: Cardiology

## 2017-11-22 VITALS — BP 136/74 | HR 49 | Ht 72.0 in | Wt 184.0 lb

## 2017-11-22 DIAGNOSIS — E7800 Pure hypercholesterolemia, unspecified: Secondary | ICD-10-CM

## 2017-11-22 DIAGNOSIS — I214 Non-ST elevation (NSTEMI) myocardial infarction: Secondary | ICD-10-CM

## 2017-11-22 DIAGNOSIS — Z9861 Coronary angioplasty status: Secondary | ICD-10-CM

## 2017-11-22 DIAGNOSIS — I2510 Atherosclerotic heart disease of native coronary artery without angina pectoris: Secondary | ICD-10-CM | POA: Diagnosis not present

## 2017-11-22 DIAGNOSIS — R001 Bradycardia, unspecified: Secondary | ICD-10-CM | POA: Insufficient documentation

## 2017-11-22 DIAGNOSIS — I10 Essential (primary) hypertension: Secondary | ICD-10-CM

## 2017-11-22 NOTE — Assessment & Plan Note (Signed)
RCA PCI with DES 11/13/17

## 2017-11-22 NOTE — Progress Notes (Signed)
Thanks MCr 

## 2017-11-22 NOTE — Assessment & Plan Note (Addendum)
11/13/17- Troponin 0.26

## 2017-11-22 NOTE — Assessment & Plan Note (Signed)
Controlled.  

## 2017-11-22 NOTE — Assessment & Plan Note (Signed)
Not on beta blocker, asymptomatic

## 2017-11-22 NOTE — Progress Notes (Signed)
11/22/2017 Kevin Wade   02-14-1951  [id number]  Primary Physician Yolanda Manges, DO Primary Cardiologist: Dr Royann Shivers  HPI:  67 y/o male with HTN, HLD, and a FM Hx of CAD,  presented 11/13/17 with [place]. Troponin went to 0.26. His EKG had NSST changes. He was placed on ASA, Heparin, and taken to the cath lab. Cath revealed high grade RCA disease, treated with PCI and DES. His LVF was normal, he had residual 40% CFX. His is in the office today for follow up.  Since discharge he has done well, no chest pain or dyspnea.    Current Outpatient Medications  Medication Sig Dispense Refill  . aspirin EC 81 MG tablet Take 81 mg by mouth daily.    Marland Kitchen atorvastatin (LIPITOR) 80 MG tablet Take 1 tablet (80 mg total) by mouth daily at 6 PM. 30 tablet 0  . famotidine (PEPCID) 20 MG tablet Take 20 mg by mouth 2 (two) times daily.    Marland Kitchen lisinopril (PRINIVIL,ZESTRIL) 20 MG tablet Take 20 mg by mouth.     . Misc Natural Products (OSTEO BI-FLEX JOINT SHIELD) TABS Take 1 tablet by mouth daily.    . Multiple Vitamins-Minerals (ONE-A-DAY MENS 50+ ADVANTAGE) TABS Take 1 tablet by mouth daily.    . nitroGLYCERIN (NITROSTAT) 0.4 MG SL tablet Place 1 tablet (0.4 mg total) under the tongue every 5 (five) minutes x 3 doses as needed for chest pain. 20 tablet 0  . Omega-3 1000 MG CAPS Take 1,000 mg by mouth daily.    . ticagrelor (BRILINTA) 90 MG TABS tablet Take 1 tablet (90 mg total) by mouth 2 (two) times daily. 60 tablet 0   No current facility-administered medications for this visit.     No Known Allergies  Past Medical History:  Diagnosis Date  . Arthritis   . Hyperlipidemia   . Hypertension     Social History   Socioeconomic History  . Marital status: Married    Spouse name: Not on file  . Number of children: Not on file  . Years of education: Not on file  . Highest education level: Not on file  Occupational History  . Not on file  Social Needs  . Financial resource strain: Not on file  .  Food insecurity:    Worry: Not on file    Inability: Not on file  . Transportation needs:    Medical: Not on file    Non-medical: Not on file  Tobacco Use  . Smoking status: Never Smoker  . Smokeless tobacco: Never Used  Substance and Sexual Activity  . Alcohol use: Yes    Comment: Socially   . Drug use: No  . Sexual activity: Not on file  Lifestyle  . Physical activity:    Days per week: Not on file    Minutes per session: Not on file  . Stress: Not on file  Relationships  . Social connections:    Talks on phone: Not on file    Gets together: Not on file    Attends religious service: Not on file    Active member of club or organization: Not on file    Attends meetings of clubs or organizations: Not on file    Relationship status: Not on file  . Intimate partner violence:    Fear of current or ex partner: Not on file    Emotionally abused: Not on file    Physically abused: Not on file    Forced sexual  activity: Not on file  Other Topics Concern  . Not on file  Social History Narrative  . Not on file     Family History  Problem Relation Age of Onset  . Hypertension Father      Review of Systems: General: negative for chills, fever, night sweats or weight changes.  Cardiovascular: negative for chest pain, dyspnea on exertion, edema, orthopnea, palpitations, paroxysmal nocturnal dyspnea or shortness of breath Dermatological: negative for rash Respiratory: negative for cough or wheezing Urologic: negative for hematuria Abdominal: negative for nausea, vomiting, diarrhea, bright red blood per rectum, melena, or hematemesis Neurologic: negative for visual changes, syncope, or dizziness All other systems reviewed and are otherwise negative except as noted above.    Blood pressure 136/74, pulse (!) 49, height 6' (1.829 m), weight 184 lb (83.5 kg).  General appearance: alert, cooperative, no distress and looks younger than his stated age Neck: no carotid bruit and no  JVD Lungs: clear to auscultation bilaterally Heart: regular rate and rhythm Extremities: extremities normal, atraumatic, no cyanosis or edema Skin: Skin color, texture, turgor normal. No rashes or lesions Neurologic: Grossly normal  EKG NSR, SB-50- ST elevation noted in V2-V5- no change from 11/14/17  ASSESSMENT AND PLAN:   Non-ST elevation (NSTEMI) myocardial infarction (HCC) 11/13/17- Troponin 0.26  CAD S/P percutaneous coronary angioplasty RCA PCI with DES 11/13/17  Bradycardia, sinus Not on beta blocker, asymptomatic  Essential hypertension Controlled   PLAN  Long discussion with patient and family. Re check B/P by me 138/72. I asked him to monitor his B/P at home and let us know what it is running. I would add HCTZ 12.5 mg if his systolic B/P is  Not at goal after a week or two. He says he tried going to Lisinopril 40 mg in the past and didn't tolerate it.  F/U CMET and lipids in 3 months, then see dr Royann Shiversroitoru. OK to drive, start exercise and cardiac rehab.  Corine ShelterLuke Willer Osorno PA-C 11/22/2017 9:33 AM

## 2017-11-22 NOTE — Patient Instructions (Signed)
Medication Instructions:  Your physician recommends that you continue on your current medications as directed. Please refer to the Current Medication list given to you today.  Labwork: Your physician recommends that you return for lab work in: LIPIDS, CMET-FASTING BEFORE APPT with DR Royann ShiversROITORU  Testing/Procedures: None   Follow-Up: Your physician recommends that you schedule a follow-up appointment in: 3 MONTHS with DR CROITORU.  Any Other Special Instructions Will Be Listed Below (If Applicable). 1. Monitor your blood pressure for 2 weeks then contact the office with your readings 2. Ok for you to drive 3. Ok to start Cardiac Rehab 4. Ok for you to exercise If you need a refill on your cardiac medications before your next appointment, please call your pharmacy.

## 2017-11-27 ENCOUNTER — Telehealth (HOSPITAL_COMMUNITY): Payer: Self-pay

## 2017-11-27 NOTE — Telephone Encounter (Signed)
Called and spoke with wife of patient to schedule patient for Cardiac Rehab - Wife stated patient has an Orthopedic appt this afternoon at 2:30 [facility] due to excruciating left shoulder pain. Patient had previous shoulder surgery on right shoulder. Patient is concerned. Wife stated he would like to wait until after the appt to decide to see what Orthopedic would like him to do. She will call back this evening.

## 2017-12-02 ENCOUNTER — Ambulatory Visit: Payer: Medicare Other | Admitting: Physician Assistant

## 2017-12-04 ENCOUNTER — Telehealth (HOSPITAL_COMMUNITY): Payer: Self-pay

## 2017-12-04 NOTE — Telephone Encounter (Signed)
Called to follow up with patient in regards to Cardiac Rehab - scheduled orientation on 01/09/18 at 8:15am. Patient will attend the 1:15pm exc class. Mailed packet.

## 2017-12-05 ENCOUNTER — Telehealth: Payer: Self-pay | Admitting: Cardiovascular Disease

## 2017-12-05 NOTE — Telephone Encounter (Signed)
New message   Pt c/o Shortness Of Breath: STAT if SOB developed within the last 24 hours or pt is noticeably SOB on the phone  1. Are you currently SOB (can you hear that pt is SOB on the phone)? No, he was having sob early this morning, the pt states that he feels like he is smothering  2. How long have you been experiencing SOB?since been on medication but he is experiencing more with last 48 hours  3. Are you SOB when sitting or when up moving around? Yes   4. Are you currently experiencing any other symptoms? bp is elevated

## 2017-12-05 NOTE — Telephone Encounter (Signed)
Patient c/o of waking up around 3:30 am this morning with SOB. Patient said it felt like he was being smothered. Patient said that he feels the sob about every 5-6 breaths. Patient said he has felt the sob since starting brilinta but says that sob has gotten worse especially during night. Patient said he was told about this side effect and that it would improve over time. Advised that message would be sent to his provider for recommendations.   Also patient said he was told to contact our office if his blood pressures continued to be elevated. Reports that his cuff is 68 year old, with fresh batteries and appropriate size. Home BP readings: (random times, different arms)  07/18 153/78; 135/80 07/20 138/72; 130/60 07/21 142/87; 150/85;  144/77 07/22 136/85; 138/89    07/23 153/88; 149/90;  156/89 07/24 149/87; 149/84;  144/84 07/24 164/82 @physical  therapist office Did not check BP today. Patient advised to start checking his BP at the same time everyday, preferably an hour after his medications, using same arm, same cuff, 5-10 minutes after sitting, record readings, and contact our office with readings in one week. Verbalized understanding of plan.   Medications reconciled. No c/o chest pain or dizziness.

## 2017-12-05 NOTE — Telephone Encounter (Signed)
If he feels he cannot breathe and is being smothered, he should be evaluated for that ASAP in urgent care or ER. If his breathing feels mildly worse than usual, we can get him in for an appointment within the next 1-2 days to discuss medication changes. It does not sounds as though he was short of breath on 7/12 when he saw [facility], so the shortness of breath needs to be evaluated in person. The blood pressures can be addressed separately. Thank you.

## 2017-12-05 NOTE — Telephone Encounter (Signed)
Discussed with Corine ShelterLuke Kilroy PA  SOB unlikely side effect from Brilinta (waking up from sleep) pt to take med with caffiene to see if would help if in fact side effect related .Per Franky MachoLuke if continues then needs to go to ED for evaluation and tx. appt made for 12/10/17 at 2:30 pm Pt's wife aware of recommendations and appt./cy

## 2017-12-10 ENCOUNTER — Encounter: Payer: Self-pay | Admitting: Cardiology

## 2017-12-10 ENCOUNTER — Ambulatory Visit (INDEPENDENT_AMBULATORY_CARE_PROVIDER_SITE_OTHER): Payer: Medicare Other | Admitting: Cardiology

## 2017-12-10 VITALS — BP 148/74 | HR 58 | Ht 71.0 in | Wt 186.0 lb

## 2017-12-10 DIAGNOSIS — Z9861 Coronary angioplasty status: Secondary | ICD-10-CM | POA: Diagnosis not present

## 2017-12-10 DIAGNOSIS — R0600 Dyspnea, unspecified: Secondary | ICD-10-CM | POA: Insufficient documentation

## 2017-12-10 DIAGNOSIS — I10 Essential (primary) hypertension: Secondary | ICD-10-CM | POA: Diagnosis not present

## 2017-12-10 DIAGNOSIS — I2510 Atherosclerotic heart disease of native coronary artery without angina pectoris: Secondary | ICD-10-CM

## 2017-12-10 DIAGNOSIS — R0609 Other forms of dyspnea: Secondary | ICD-10-CM | POA: Diagnosis not present

## 2017-12-10 NOTE — Progress Notes (Signed)
12/10/2017 Kevin Wade   1950-08-06  [id number]  Primary Physician Yolanda Manges, DO Primary Cardiologist: Dr Royann Shivers  HPI:  Pleasant 67 y/o male with HTN, HLD, and a FM Hx of CAD,  presented 11/13/17 with [place]. His Troponin went to 0.26.  His EKG had NSST changes. He was placed on ASA, Heparin, and taken to the cath lab.  Cath revealed high grade RCA disease, treated with PCI and DES. His LVF was normal, he had residual 40% CFX. I saw him in follow up 11/22/17. He had some concerns about his medications, he thought the Brilinta replaced his Lisinopril. His B/P was elevated and I asked him to resume Lisinopril 20 mg. He is here today for follow up. Since I saw him last he has noticed some SOB- vague- which he attributes to his Brilinta.  He did have an episode where he woke up SOB at 3 am one night.  He denies chest pain. He admits he had somehow forgot to resume his Lisinopril until 2 days ago.  In the office a repeat B/P by me 122/62.    Current Outpatient Medications  Medication Sig Dispense Refill  . aspirin EC 81 MG tablet Take 81 mg by mouth daily.    Marland Kitchen atorvastatin (LIPITOR) 80 MG tablet Take 1 tablet (80 mg total) by mouth daily at 6 PM. 30 tablet 0  . famotidine (PEPCID) 20 MG tablet Take 20 mg by mouth 2 (two) times daily.    Marland Kitchen lisinopril (PRINIVIL,ZESTRIL) 20 MG tablet Take 20 mg by mouth.     . Misc Natural Products (OSTEO BI-FLEX JOINT SHIELD) TABS Take 1 tablet by mouth daily.    . Multiple Vitamins-Minerals (ONE-A-DAY MENS 50+ ADVANTAGE) TABS Take 1 tablet by mouth daily.    . nitroGLYCERIN (NITROSTAT) 0.4 MG SL tablet Place 1 tablet (0.4 mg total) under the tongue every 5 (five) minutes x 3 doses as needed for chest pain. 20 tablet 0  . Omega-3 1000 MG CAPS Take 1,000 mg by mouth daily.    . ticagrelor (BRILINTA) 90 MG TABS tablet Take 1 tablet (90 mg total) by mouth 2 (two) times daily. 60 tablet 0   No current facility-administered medications for this visit.     No  Known Allergies  Past Medical History:  Diagnosis Date  . Arthritis   . Hyperlipidemia   . Hypertension     Social History   Socioeconomic History  . Marital status: Married    Spouse name: Not on file  . Number of children: Not on file  . Years of education: Not on file  . Highest education level: Not on file  Occupational History  . Not on file  Social Needs  . Financial resource strain: Not on file  . Food insecurity:    Worry: Not on file    Inability: Not on file  . Transportation needs:    Medical: Not on file    Non-medical: Not on file  Tobacco Use  . Smoking status: Never Smoker  . Smokeless tobacco: Never Used  Substance and Sexual Activity  . Alcohol use: Yes    Comment: Socially   . Drug use: No  . Sexual activity: Not on file  Lifestyle  . Physical activity:    Days per week: Not on file    Minutes per session: Not on file  . Stress: Not on file  Relationships  . Social connections:    Talks on phone: Not on file  Gets together: Not on file    Attends religious service: Not on file    Active member of club or organization: Not on file    Attends meetings of clubs or organizations: Not on file    Relationship status: Not on file  . Intimate partner violence:    Fear of current or ex partner: Not on file    Emotionally abused: Not on file    Physically abused: Not on file    Forced sexual activity: Not on file  Other Topics Concern  . Not on file  Social History Narrative  . Not on file     Family History  Problem Relation Age of Onset  . Hypertension Father      Review of Systems: General: negative for chills, fever, night sweats or weight changes.  Cardiovascular: negative for chest pain, dyspnea on exertion, edema, orthopnea, palpitations, paroxysmal nocturnal dyspnea or shortness of breath Dermatological: negative for rash Respiratory: negative for cough or wheezing Urologic: negative for hematuria Abdominal: negative for nausea,  vomiting, diarrhea, bright red blood per rectum, melena, or hematemesis Neurologic: negative for visual changes, syncope, or dizziness All other systems reviewed and are otherwise negative except as noted above.    Blood pressure (!) 148/74, pulse (!) 58, height 5\' 11"  (1.803 m), weight 186 lb (84.4 kg).  General appearance: alert, cooperative and no distress Neck: no JVD Lungs: clear to auscultation bilaterally Heart: regular rate and rhythm Extremities: extremities normal, atraumatic, no cyanosis or edema Skin: Skin color, texture, turgor normal. No rashes or lesions Neurologic: Grossly normal  EKG NSR- SB-58  ASSESSMENT AND PLAN:   Dyspnea This is mild and probably related to Brilinta I offered changing him to another agent but he would like to contniue this for now. He'll try taking it with a small amount of caffeine.   Non-ST elevation (NSTEMI) myocardial infarction (HCC) 11/13/17- Troponin 0.26  CAD S/P percutaneous coronary angioplasty RCA PCI with DES 11/13/17  Bradycardia, sinus Not on beta blocker, asymptomatic  Essential hypertension Controlled   PLAN  Same Rx. He'll contact us if he wants to come off Brilinta and go on Plavix.  He and his wife are going on a cruise and will see Dr Royann Shiversroitoru when they return.   Corine ShelterLuke Rudolf Blizard PA-C 12/10/2017 3:48 PM

## 2017-12-10 NOTE — Patient Instructions (Signed)
Medication Instructions:  Your physician recommends that you continue on your current medications as directed. Please refer to the Current Medication list given to you today.  **Start taking your Brilinta with 6 ounces of caffeine.   Labwork: none  Testing/Procedures: none  Follow-Up:Keep scheduled follow-up appt. With Dr.C in October.  Any Other Special Instructions Will Be Listed Below (If Applicable).     If you need a refill on your cardiac medications before your next appointment, please call your pharmacy.

## 2017-12-13 ENCOUNTER — Telehealth: Payer: Self-pay | Admitting: Cardiovascular Disease

## 2017-12-13 MED ORDER — TICAGRELOR 90 MG PO TABS: 90.0000 mg | ORAL_TABLET | Freq: Two times a day (BID) | ORAL | 1 refills | Status: DC

## 2017-12-13 NOTE — Telephone Encounter (Signed)
°*  STAT* If patient is at the pharmacy, call can be transferred to refill team.   1. Which medications need to be refilled? (please list name of each medication and dose if known) Brilinta  90mg   2. Which pharmacy/location (including street and city if local pharmacy) is medication to be sent to?[facility]  3. Do they need a 30 day or 90 day supply? 90

## 2018-01-03 ENCOUNTER — Telehealth (HOSPITAL_COMMUNITY): Payer: Self-pay

## 2018-01-03 NOTE — Telephone Encounter (Signed)
Cardiac Rehab Medication Review by a Pharmacist  Does the patient  feel that his/her medications are working for him/her?  yes  Has the patient been experiencing any side effects to the medications prescribed?  no  Does the patient measure his/her own blood pressure or blood glucose at home?  yes   Does the patient have any problems obtaining medications due to transportation or finances?   no  Understanding of regimen: good Understanding of indications: good Potential of compliance: good  Pharmacist comments:  Patient reports SBP is <140 mmHg when he takes his blood pressure at home  Patient states he no longer feels shortness of breath with Kevin CurbBrilinta  Kevin Wade, PharmD PGY1 Pharmacy Resident Phone [phone] 01/03/2018       3:16 PM

## 2018-01-09 ENCOUNTER — Encounter (HOSPITAL_COMMUNITY)
Admission: RE | Admit: 2018-01-09 | Discharge: 2018-01-09 | Disposition: A | Discharge status: Home / Self Care | Payer: Medicare Other | Source: Ambulatory Visit | Attending: Cardiovascular Disease | Admitting: Cardiovascular Disease

## 2018-01-09 ENCOUNTER — Encounter (HOSPITAL_COMMUNITY): Payer: Self-pay

## 2018-01-09 VITALS — Ht 71.25 in | Wt 190.3 lb

## 2018-01-09 DIAGNOSIS — I214 Non-ST elevation (NSTEMI) myocardial infarction: Secondary | ICD-10-CM

## 2018-01-09 DIAGNOSIS — Z955 Presence of coronary angioplasty implant and graft: Secondary | ICD-10-CM | POA: Insufficient documentation

## 2018-01-09 DIAGNOSIS — Z48812 Encounter for surgical aftercare following surgery on the circulatory system: Secondary | ICD-10-CM | POA: Diagnosis not present

## 2018-01-09 DIAGNOSIS — I2510 Atherosclerotic heart disease of native coronary artery without angina pectoris: Secondary | ICD-10-CM

## 2018-01-09 DIAGNOSIS — Z9861 Coronary angioplasty status: Secondary | ICD-10-CM

## 2018-01-09 NOTE — Progress Notes (Addendum)
Cardiac Individual Treatment Plan  Patient Details  Name: Kevin Wade MRN: [id number] Date of Birth: Oct 10, 1950 Referring Provider:     CARDIAC REHAB PHASE II ORIENTATION from 01/09/2018 in [facility] CARDIAC [facility]  Referring Provider  Croitoru, Mihai MD       Initial Encounter Date:    CARDIAC REHAB PHASE II ORIENTATION from 01/09/2018 in [facility] CARDIAC REHAB  Date  01/09/18      Visit Diagnosis: Non-ST elevation (NSTEMI) myocardial infarction [facility])  CAD S/P percutaneous coronary angioplasty  Patient's Home Medications on Admission:  Current Outpatient Medications:  .  aspirin EC 81 MG tablet, Take 81 mg by mouth daily., Disp: , Rfl:  .  atorvastatin (LIPITOR) 80 MG tablet, Take 1 tablet (80 mg total) by mouth daily at 6 PM., Disp: 30 tablet, Rfl: 0 .  famotidine (PEPCID) 20 MG tablet, Take 20 mg by mouth 2 (two) times daily., Disp: , Rfl:  .  lisinopril (PRINIVIL,ZESTRIL) 20 MG tablet, Take 20 mg by mouth. , Disp: , Rfl:  .  Misc Natural Products (OSTEO BI-FLEX JOINT SHIELD) TABS, Take 1 tablet by mouth daily., Disp: , Rfl:  .  Multiple Vitamins-Minerals (ONE-A-DAY MENS 50+ ADVANTAGE) TABS, Take 1 tablet by mouth daily., Disp: , Rfl:  .  nitroGLYCERIN (NITROSTAT) 0.4 MG SL tablet, Place 1 tablet (0.4 mg total) under the tongue every 5 (five) minutes x 3 doses as needed for chest pain. (Patient not taking: Reported on 01/03/2018), Disp: 20 tablet, Rfl: 0 .  Omega-3 1000 MG CAPS, Take 1,000 mg by mouth daily., Disp: , Rfl:  .  ticagrelor (BRILINTA) 90 MG TABS tablet, Take 1 tablet (90 mg total) by mouth 2 (two) times daily., Disp: 180 tablet, Rfl: 1  Past Medical History: Past Medical History:  Diagnosis Date  . Arthritis   . Hyperlipidemia   . Hypertension     Tobacco Use: Social History   Tobacco Use  Smoking Status Never Smoker  Smokeless Tobacco Never Used    Labs: Recent Review Advice worker    Labs for ITP Cardiac and  Pulmonary Rehab Latest Ref Rng & Units 11/14/2017   Cholestrol 0 - 200 mg/dL 782   LDLCALC 0 - 99 mg/dL 80   HDL >95 mg/dL 47   Trlycerides <621 mg/dL 82      Capillary Blood Glucose: No results found for: GLUCAP   Exercise Target Goals: Exercise Program Goal: Individual exercise prescription set using results from initial 6 min walk test and THRR while considering  patient's activity barriers and safety.   Exercise Prescription Goal: Initial exercise prescription builds to 30-45 minutes a day of aerobic activity, 2-3 days per week.  Home exercise guidelines will be given to patient during program as part of exercise prescription that the participant will acknowledge.  Activity Barriers & Risk Stratification: Activity Barriers & Cardiac Risk Stratification - 01/09/18 1144      Activity Barriers & Cardiac Risk Stratification   Activity Barriers  Joint Problems;Other (comment)    Comments  Left Shoulder Chronic Pain, Left Bicep Tear    Cardiac Risk Stratification  Moderate       6 Minute Walk: 6 Minute Walk    Row Name 01/09/18 1143         6 Minute Walk   Phase  Initial     Distance  1584 feet     Walk Time  6 minutes     # of Rest Breaks  0  MPH  3     METS  2.99     RPE  12     Perceived Dyspnea   0     VO2 Peak  10.5     Symptoms  No     Resting HR  50 bpm     Resting BP  120/62     Resting Oxygen Saturation   98 %     Exercise Oxygen Saturation  during 6 min walk  99 %     Max Ex. HR  88 bpm     Max Ex. BP  128/80     2 Minute Post BP  116/70        Oxygen Initial Assessment:   Oxygen Re-Evaluation:   Oxygen Discharge (Final Oxygen Re-Evaluation):   Initial Exercise Prescription: Initial Exercise Prescription - 01/09/18 1100      Date of Initial Exercise RX and Referring Provider   Date  01/09/18    Referring Provider  Thurmon Fair MD     Expected Discharge Date  04/04/18      Treadmill   MPH  2.4    Grade  0    Minutes  10    METs   2.84      Recumbant Bike   Level  2    Watts  30    Minutes  10    METs  3.11      NuStep   Level  2    SPM  80    Minutes  10    METs  2.7      Prescription Details   Frequency (times per week)  3x    Duration  Progress to 30 minutes of continuous aerobic without signs/symptoms of physical distress      Intensity   THRR 40-80% of Max Heartrate  61-122    Ratings of Perceived Exertion  11-13    Perceived Dyspnea  0-4      Progression   Progression  Continue progressive overload as per policy without signs/symptoms or physical distress.      Resistance Training   Training Prescription  Yes    Weight  3lbs    Reps  10-15       Perform Capillary Blood Glucose checks as needed.  Exercise Prescription Changes:   Exercise Comments:   Exercise Goals and Review: Exercise Goals    Row Name 01/09/18 1145             Exercise Goals   Increase Physical Activity  Yes       Intervention  Develop an individualized exercise prescription for aerobic and resistive training based on initial evaluation findings, risk stratification, comorbidities and participant's personal goals.;Provide advice, education, support and counseling about physical activity/exercise needs.       Expected Outcomes  Short Term: Attend rehab on a regular basis to increase amount of physical activity.;Long Term: Add in home exercise to make exercise part of routine and to increase amount of physical activity.;Long Term: Exercising regularly at least 3-5 days a week.       Increase Strength and Stamina  Yes       Intervention  Provide advice, education, support and counseling about physical activity/exercise needs.;Develop an individualized exercise prescription for aerobic and resistive training based on initial evaluation findings, risk stratification, comorbidities and participant's personal goals.       Expected Outcomes  Short Term: Increase workloads from initial exercise prescription for resistance,  speed, and METs.;Short Term:  Perform resistance training exercises routinely during rehab and add in resistance training at home;Long Term: Improve cardiorespiratory fitness, muscular endurance and strength as measured by increased METs and functional capacity ( )       Able to understand and use rate of perceived exertion (RPE) scale  Yes       Intervention  Provide education and explanation on how to use RPE scale       Expected Outcomes  Short Term: Able to use RPE daily in rehab to express subjective intensity level;Long Term:  Able to use RPE to guide intensity level when exercising independently       Knowledge and understanding of Target Heart Rate Range (THRR)  Yes       Intervention  Provide education and explanation of THRR including how the numbers were predicted and where they are located for reference       Expected Outcomes  Short Term: Able to state/look up THRR;Long Term: Able to use THRR to govern intensity when exercising independently;Short Term: Able to use daily as guideline for intensity in rehab       Understanding of Exercise Prescription  Yes       Intervention  Provide education, explanation, and written materials on patient's individual exercise prescription       Expected Outcomes  Short Term: Able to explain program exercise prescription;Long Term: Able to explain home exercise prescription to exercise independently          Exercise Goals Re-Evaluation :   Discharge Exercise Prescription (Final Exercise Prescription Changes):   Nutrition:  Target Goals: Understanding of nutrition guidelines, daily intake of sodium 1500mg , cholesterol 200mg , calories 30% from fat and 7% or less from saturated fats, daily to have 5 or more servings of fruits and vegetables.  Biometrics: Pre Biometrics - 01/09/18 1144      Pre Biometrics   Height  5' 11.25" (1.81 m)    Weight  86.3 kg    Waist Circumference  37.5 inches    Hip Circumference  39 inches    Waist to Hip  Ratio  0.96 %    BMI (Calculated)  26.34    Triceps Skinfold  13 mm    % Body Fat  25 %    Grip Strength  38 kg    Flexibility  8 in    Single Leg Stand  30 seconds        Nutrition Therapy Plan and Nutrition Goals: Nutrition Therapy & Goals - 01/09/18 1150      Nutrition Therapy   Diet  heart healthy      Personal Nutrition Goals   Nutrition Goal  Pt to identify and limit food sources of saturated fat, trans fat, refined carbohydrates and sodium      Intervention Plan   Intervention  Prescribe, educate and counsel regarding individualized specific dietary modifications aiming towards targeted core components such as weight, hypertension, lipid management, diabetes, heart failure and other comorbidities.    Expected Outcomes  Short Term Goal: Understand basic principles of dietary content, such as calories, fat, sodium, cholesterol and nutrients.       Nutrition Assessments: Nutrition Assessments - 01/09/18 1151      MEDFICTS Scores   Pre Score  23       Nutrition Goals Re-Evaluation:   Nutrition Goals Re-Evaluation:   Nutrition Goals Discharge (Final Nutrition Goals Re-Evaluation):   Psychosocial: Target Goals: Acknowledge presence or absence of significant depression and/or stress, maximize coping skills, provide positive support  system. Participant is able to verbalize types and ability to use techniques and skills needed for reducing stress and depression.  Initial Review & Psychosocial Screening: Initial Psych Review & Screening - 01/09/18 1141      Initial Review   Current issues with  None Identified      Family Dynamics   Good Support System?  Yes   Leonette MostCharles reports his wife as a source of support.     Barriers   Psychosocial barriers to participate in program  There are no identifiable barriers or psychosocial needs.      Screening Interventions   Interventions  Encouraged to exercise       Quality of Life Scores: Quality of Life - 01/09/18  1142      Quality of Life   Select  Quality of Life      Quality of Life Scores   Health/Function Pre  28.2 %    Socioeconomic Pre  27.08 %    Psych/Spiritual Pre  30 %    Family Pre  28.3 %    GLOBAL Pre  28.39 %      Scores of 19 and below usually indicate a poorer quality of life in these areas.  A difference of  2-3 points is a clinically meaningful difference.  A difference of 2-3 points in the total score of the Quality of Life Index has been associated with significant improvement in overall quality of life, self-image, physical symptoms, and general health in studies assessing change in quality of life.  PHQ-9: Recent Review Flowsheet Data    [facility]   Decreased Interest 0   Down, Depressed, Hopeless 0   PHQ - 2 Score 0     Interpretation of Total Score  Total Score Depression Severity:  1-4 = Minimal depression, 5-9 = Mild depression, 10-14 = Moderate depression, 15-19 = Moderately severe depression, 20-27 = Severe depression   Psychosocial Evaluation and Intervention:   Psychosocial Re-Evaluation:   Psychosocial Discharge (Final Psychosocial Re-Evaluation):   Vocational Rehabilitation: Provide vocational rehab assistance to qualifying candidates.   Vocational Rehab Evaluation & Intervention: Vocational Rehab - 01/09/18 1142      Initial Vocational Rehab Evaluation & Intervention   Assessment shows need for Vocational Rehabilitation  No       Education: Education Goals: Education classes will be provided on a weekly basis, covering required topics. Participant will state understanding/return demonstration of topics presented.  Learning Barriers/Preferences: Learning Barriers/Preferences - 01/09/18 1153      Learning Barriers/Preferences   Learning Barriers  Sight    Learning Preferences  Written Material;Video;Skilled Demonstration       Education Topics: Count Your Pulse:  -Group instruction provided by verbal  instruction, demonstration, patient participation and written materials to support subject.  Instructors address importance of being able to find your pulse and how to count your pulse when at home without a heart monitor.  Patients get hands on experience counting their pulse with staff help and individually.   Heart Attack, Angina, and Risk Factor Modification:  -Group instruction provided by verbal instruction, video, and written materials to support subject.  Instructors address signs and symptoms of angina and heart attacks.    Also discuss risk factors for heart disease and how to make changes to improve heart health risk factors.   Functional Fitness:  -Group instruction provided by verbal instruction, demonstration, patient participation, and written materials to support subject.  Instructors address safety measures for doing things around  the house.  Discuss how to get up and down off the floor, how to pick things up properly, how to safely get out of a chair without assistance, and balance training.   Meditation and Mindfulness:  -Group instruction provided by verbal instruction, patient participation, and written materials to support subject.  Instructor addresses importance of mindfulness and meditation practice to help reduce stress and improve awareness.  Instructor also leads participants through a meditation exercise.    Stretching for Flexibility and Mobility:  -Group instruction provided by verbal instruction, patient participation, and written materials to support subject.  Instructors lead participants through series of stretches that are designed to increase flexibility thus improving mobility.  These stretches are additional exercise for major muscle groups that are typically performed during regular warm up and cool down.   Hands Only CPR:  -Group verbal, video, and participation provides a basic overview of AHA guidelines for community CPR. Role-play of emergencies allow  participants the opportunity to practice calling for help and chest compression technique with discussion of AED use.   Hypertension: -Group verbal and written instruction that provides a basic overview of hypertension including the most recent diagnostic guidelines, risk factor reduction with self-care instructions and medication management.    Nutrition I class: Heart Healthy Eating:  -Group instruction provided by PowerPoint slides, verbal discussion, and written materials to support subject matter. The instructor gives an explanation and review of the Therapeutic Lifestyle Changes diet recommendations, which includes a discussion on lipid goals, dietary fat, sodium, fiber, plant stanol/sterol esters, sugar, and the components of a well-balanced, healthy diet.   Nutrition II class: Lifestyle Skills:  -Group instruction provided by PowerPoint slides, verbal discussion, and written materials to support subject matter. The instructor gives an explanation and review of label reading, grocery shopping for heart health, heart healthy recipe modifications, and ways to make healthier choices when eating out.   Diabetes Question & Answer:  -Group instruction provided by PowerPoint slides, verbal discussion, and written materials to support subject matter. The instructor gives an explanation and review of diabetes co-morbidities, pre- and post-prandial blood glucose goals, pre-exercise blood glucose goals, signs, symptoms, and treatment of hypoglycemia and hyperglycemia, and foot care basics.   Diabetes Blitz:  -Group instruction provided by PowerPoint slides, verbal discussion, and written materials to support subject matter. The instructor gives an explanation and review of the physiology behind type 1 and type 2 diabetes, diabetes medications and rational behind using different medications, pre- and post-prandial blood glucose recommendations and Hemoglobin A1c goals, diabetes diet, and exercise  including blood glucose guidelines for exercising safely.    Portion Distortion:  -Group instruction provided by PowerPoint slides, verbal discussion, written materials, and food models to support subject matter. The instructor gives an explanation of serving size versus portion size, changes in portions sizes over the last 20 years, and what consists of a serving from each food group.   Stress Management:  -Group instruction provided by verbal instruction, video, and written materials to support subject matter.  Instructors review role of stress in heart disease and how to cope with stress positively.     Exercising on Your Own:  -Group instruction provided by verbal instruction, power point, and written materials to support subject.  Instructors discuss benefits of exercise, components of exercise, frequency and intensity of exercise, and end points for exercise.  Also discuss use of nitroglycerin and activating EMS.  Review options of places to exercise outside of rehab.  Review guidelines for sex with  heart disease.   Cardiac Drugs I:  -Group instruction provided by verbal instruction and written materials to support subject.  Instructor reviews cardiac drug classes: antiplatelets, anticoagulants, beta blockers, and statins.  Instructor discusses reasons, side effects, and lifestyle considerations for each drug class.   Cardiac Drugs II:  -Group instruction provided by verbal instruction and written materials to support subject.  Instructor reviews cardiac drug classes: angiotensin converting enzyme inhibitors (ACE-I), angiotensin II receptor blockers (ARBs), nitrates, and calcium channel blockers.  Instructor discusses reasons, side effects, and lifestyle considerations for each drug class.   Anatomy and Physiology of the Circulatory System:  Group verbal and written instruction and models provide basic cardiac anatomy and physiology, with the coronary electrical and arterial systems.  Review of: AMI, Angina, Valve disease, Heart Failure, Peripheral Artery Disease, Cardiac Arrhythmia, Pacemakers, and the ICD.   Other Education:  -Group or individual verbal, written, or video instructions that support the educational goals of the cardiac rehab program.   Holiday Eating Survival Tips:  -Group instruction provided by PowerPoint slides, verbal discussion, and written materials to support subject matter. The instructor gives patients tips, tricks, and techniques to help them not only survive but enjoy the holidays despite the onslaught of food that accompanies the holidays.   Knowledge Questionnaire Score: Knowledge Questionnaire Score - 01/09/18 1142      Knowledge Questionnaire Score   Pre Score  --   Missing questions needs to complete      Core Components/Risk Factors/Patient Goals at Admission: Personal Goals and Risk Factors at Admission - 01/09/18 1331      Core Components/Risk Factors/Patient Goals on Admission    Weight Management  Yes;Weight Loss    Intervention  Weight Management: Develop a combined nutrition and exercise program designed to reach desired caloric intake, while maintaining appropriate intake of nutrient and fiber, sodium and fats, and appropriate energy expenditure required for the weight goal.;Weight Management: Provide education and appropriate resources to help participant work on and attain dietary goals.;Weight Management/Obesity: Establish reasonable short term and long term weight goals.;Obesity: Provide education and appropriate resources to help participant work on and attain dietary goals.    Admit Weight  211 lb 6.7 oz (95.9 kg)    Goal Weight: Long Term  200 lb (90.7 kg)    Expected Outcomes  Long Term: Adherence to nutrition and physical activity/exercise program aimed toward attainment of established weight goal;Short Term: Continue to assess and modify interventions until short term weight is achieved;Weight Loss: Understanding of  general recommendations for a balanced deficit meal plan, which promotes 1-2 lb weight loss per week and includes a negative energy balance of [phone] kcal/d;Understanding recommendations for meals to include 15-35% energy as protein, 25-35% energy from fat, 35-60% energy from carbohydrates, less than 200mg  of dietary cholesterol, 20-35 gm of total fiber daily;Understanding of distribution of calorie intake throughout the day with the consumption of 4-5 meals/snacks    Hypertension  Yes    Intervention  Provide education on lifestyle modifcations including regular physical activity/exercise, weight management, moderate sodium restriction and increased consumption of fresh fruit, vegetables, and low fat dairy, alcohol moderation, and smoking cessation.;Monitor prescription use compliance.    Expected Outcomes  Short Term: Continued assessment and intervention until BP is < 140/7mm HG in hypertensive participants. < 130/28mm HG in hypertensive participants with diabetes, heart failure or chronic kidney disease.;Long Term: Maintenance of blood pressure at goal levels.    Lipids  Yes    Intervention  Provide education and  support for participant on nutrition & aerobic/resistive exercise along with prescribed medications to achieve LDL 70mg , HDL >40mg .    Expected Outcomes  Short Term: Participant states understanding of desired cholesterol values and is compliant with medications prescribed. Participant is following exercise prescription and nutrition guidelines.;Long Term: Cholesterol controlled with medications as prescribed, with individualized exercise RX and with personalized nutrition plan. Value goals: LDL < 70mg , HDL > 40 mg.       Core Components/Risk Factors/Patient Goals Review:    Core Components/Risk Factors/Patient Goals at Discharge (Final Review):    ITP Comments: ITP Comments    Row Name 01/09/18 1137           ITP Comments  Dr. Armanda Magic, Medical Director           Comments: Patient attended orientation from [phone] to 1018 to review rules and guidelines for program. Completed 6 minute walk test, Intitial ITP, and exercise prescription.  VSS. Telemetry-SB/SR.  Asymptomatic.

## 2018-01-09 NOTE — Progress Notes (Signed)
Kevin Wade 67 y.o. male DOB: [birth year] MRN: [id number]      Nutrition Note  No diagnosis found. Past Medical History:  Diagnosis Date  . Arthritis   . Hyperlipidemia   . Hypertension    Meds reviewed. Lipitor, MVI, Omega-3 1000 mg caps noted  HT: Ht Readings from Last 1 Encounters:  12/10/17 5\' 11"  (1.803 m)    WT: Wt Readings from Last 5 Encounters:  12/10/17 186 lb (84.4 kg)  11/22/17 184 lb (83.5 kg)  11/14/17 185 lb 11.2 oz (84.2 kg)  01/04/15 190 lb (86.2 kg)     There is no height or weight on file to calculate BMI.   Current tobacco use? No  Labs:  Lipid Panel     Component Value Date/Time   CHOL 143 11/14/2017 0322   TRIG 82 11/14/2017 0322   HDL 47 11/14/2017 0322   CHOLHDL 3.0 11/14/2017 0322   VLDL 16 11/14/2017 0322   LDLCALC 80 11/14/2017 0322    No results found for: HGBA1C CBG (last 3)  No results for input(s): GLUCAP in the last 72 hours.  Nutrition Note Spoke with pt. Nutrition plan and goals reviewed with pt. Pt is following Step 2 of the Therapeutic Lifestyle Changes diet. Additional heart healthy tips reviewed (label reading, how to build a healthy plate, portion sizes, eating frequently across the day). Per discussion, pt does not use canned/convenience foods often. Pt rarely adds salt to food. Pt eats out infrequently. Pt expressed understanding of the information reviewed. Pt aware of nutrition education classes offered and plans on attending nutrition classes.  Nutrition Diagnosis ? Food-and nutrition-related knowledge deficit related to lack of exposure to information as related to diagnosis of: ? CVD   Nutrition Intervention ? Pt's individual nutrition plan and goals reviewed with pt.  Nutrition Goal(s):   ? Pt to identify and limit food sources of saturated fat, trans fat, refined carbohydrates and sodium   Plan:  ? Pt to attend nutrition classes ? Nutrition I ? Nutrition II ? Portion Distortion  ? Will provide client-centered  nutrition education as part of interdisciplinary care ? Monitor and evaluate progress toward nutrition goal with team.   Ross MarcusAubrey Burklin, MS, RD, LDN 01/09/2018 8:43 AM

## 2018-01-17 NOTE — Progress Notes (Signed)
Cardiac Individual Treatment Plan  Patient Details  Name: Kevin Wade MRN: [id number] Date of Birth: 1950/06/16 Referring Provider:   Flowsheet Row CARDIAC REHAB PHASE II ORIENTATION from 01/09/2018 in [facility] CARDIAC [facility]  Referring Provider  Croitoru, Mihai MD       Initial Encounter Date:  Flowsheet Row CARDIAC REHAB PHASE II ORIENTATION from 01/09/2018 in [facility] CARDIAC REHAB  Date  01/09/18      Visit Diagnosis: Non-ST elevation (NSTEMI) myocardial infarction [facility])  CAD S/P percutaneous coronary angioplasty  Patient's Home Medications on Admission:  Current Outpatient Medications:  .  aspirin EC 81 MG tablet, Take 81 mg by mouth daily., Disp: , Rfl:  .  atorvastatin (LIPITOR) 80 MG tablet, Take 1 tablet (80 mg total) by mouth daily at 6 PM., Disp: 30 tablet, Rfl: 0 .  famotidine (PEPCID) 20 MG tablet, Take 20 mg by mouth 2 (two) times daily., Disp: , Rfl:  .  lisinopril (PRINIVIL,ZESTRIL) 20 MG tablet, Take 20 mg by mouth. , Disp: , Rfl:  .  Misc Natural Products (OSTEO BI-FLEX JOINT SHIELD) TABS, Take 1 tablet by mouth daily., Disp: , Rfl:  .  Multiple Vitamins-Minerals (ONE-A-DAY MENS 50+ ADVANTAGE) TABS, Take 1 tablet by mouth daily., Disp: , Rfl:  .  nitroGLYCERIN (NITROSTAT) 0.4 MG SL tablet, Place 1 tablet (0.4 mg total) under the tongue every 5 (five) minutes x 3 doses as needed for chest pain. (Patient not taking: Reported on 01/03/2018), Disp: 20 tablet, Rfl: 0 .  Omega-3 1000 MG CAPS, Take 1,000 mg by mouth daily., Disp: , Rfl:  .  ticagrelor (BRILINTA) 90 MG TABS tablet, Take 1 tablet (90 mg total) by mouth 2 (two) times daily., Disp: 180 tablet, Rfl: 1  Past Medical History: Past Medical History:  Diagnosis Date  . Arthritis   . Hyperlipidemia   . Hypertension     Tobacco Use: Social History   Tobacco Use  Smoking Status Never Smoker  Smokeless Tobacco Never Used    Labs: Recent Review Advice worker    Labs for ITP Cardiac and Pulmonary Rehab Latest Ref Rng & Units 11/14/2017   Cholestrol 0 - 200 mg/dL 951   LDLCALC 0 - 99 mg/dL 80   HDL >88 mg/dL 47   Trlycerides <416 mg/dL 82      Capillary Blood Glucose: No results found for: GLUCAP   Exercise Target Goals: Exercise Program Goal: Individual exercise prescription set using results from initial 6 min walk test and THRR while considering  patient's activity barriers and safety.   Exercise Prescription Goal: Initial exercise prescription builds to 30-45 minutes a day of aerobic activity, 2-3 days per week.  Home exercise guidelines will be given to patient during program as part of exercise prescription that the participant will acknowledge.  Activity Barriers & Risk Stratification: Activity Barriers & Cardiac Risk Stratification - 01/09/18 1144    Activity Barriers & Cardiac Risk Stratification          Activity Barriers  Joint Problems;Other (comment)    Comments  Left Shoulder Chronic Pain, Left Bicep Tear    Cardiac Risk Stratification  Moderate           6 Minute Walk: 6 Minute Walk    6 Minute Walk    Row Name 01/09/18 1143   Phase  Initial   Distance  1584 feet   Walk Time  6 minutes   # of Rest Breaks  0   MPH  3   METS  2.99   RPE  12   Perceived Dyspnea   0   VO2 Peak  10.5   Symptoms  No   Resting HR  50 bpm   Resting BP  120/62   Resting Oxygen Saturation   98 %   Exercise Oxygen Saturation  during 6 min walk  99 %   Max Ex. HR  88 bpm   Max Ex. BP  128/80   2 Minute Post BP  116/70          Oxygen Initial Assessment:   Oxygen Re-Evaluation:   Oxygen Discharge (Final Oxygen Re-Evaluation):   Initial Exercise Prescription: Initial Exercise Prescription - 01/09/18 1100    Date of Initial Exercise RX and Referring Provider          Date  01/09/18    Referring Provider  Thurmon Fair MD     Expected Discharge Date  04/04/18        Treadmill          MPH  2.4    Grade  0     Minutes  10    METs  2.84        Recumbant Bike          Level  2    Watts  30    Minutes  10    METs  3.11        NuStep          Level  2    SPM  80    Minutes  10    METs  2.7        Prescription Details          Frequency (times per week)  3x    Duration  Progress to 30 minutes of continuous aerobic without signs/symptoms of physical distress        Intensity          THRR 40-80% of Max Heartrate  61-122    Ratings of Perceived Exertion  11-13    Perceived Dyspnea  0-4        Progression          Progression  Continue progressive overload as per policy without signs/symptoms or physical distress.        Resistance Training          Training Prescription  Yes    Weight  3lbs    Reps  10-15           Perform Capillary Blood Glucose checks as needed.  Exercise Prescription Changes:   Exercise Comments:   Exercise Goals and Review: Exercise Goals    Exercise Goals    Row Name 01/09/18 1145   Increase Physical Activity  Yes   Intervention  Develop an individualized exercise prescription for aerobic and resistive training based on initial evaluation findings, risk stratification, comorbidities and participant's personal goals.;Provide advice, education, support and counseling about physical activity/exercise needs.   Expected Outcomes  Short Term: Attend rehab on a regular basis to increase amount of physical activity.;Long Term: Add in home exercise to make exercise part of routine and to increase amount of physical activity.;Long Term: Exercising regularly at least 3-5 days a week.   Increase Strength and Stamina  Yes   Intervention  Provide advice, education, support and counseling about physical activity/exercise needs.;Develop an individualized exercise prescription for aerobic and resistive training based on initial evaluation findings, risk stratification, comorbidities and participant's personal goals.  Expected Outcomes  Short Term: Increase  workloads from initial exercise prescription for resistance, speed, and METs.;Short Term: Perform resistance training exercises routinely during rehab and add in resistance training at home;Long Term: Improve cardiorespiratory fitness, muscular endurance and strength as measured by increased METs and functional capacity ( )   Able to understand and use rate of perceived exertion (RPE) scale  Yes   Intervention  Provide education and explanation on how to use RPE scale   Expected Outcomes  Short Term: Able to use RPE daily in rehab to express subjective intensity level;Long Term:  Able to use RPE to guide intensity level when exercising independently   Knowledge and understanding of Target Heart Rate Range (THRR)  Yes   Intervention  Provide education and explanation of THRR including how the numbers were predicted and where they are located for reference   Expected Outcomes  Short Term: Able to state/look up THRR;Long Term: Able to use THRR to govern intensity when exercising independently;Short Term: Able to use daily as guideline for intensity in rehab   Understanding of Exercise Prescription  Yes   Intervention  Provide education, explanation, and written materials on patient's individual exercise prescription   Expected Outcomes  Short Term: Able to explain program exercise prescription;Long Term: Able to explain home exercise prescription to exercise independently          Exercise Goals Re-Evaluation :   Discharge Exercise Prescription (Final Exercise Prescription Changes):   Nutrition:  Target Goals: Understanding of nutrition guidelines, daily intake of sodium 1500mg , cholesterol 200mg , calories 30% from fat and 7% or less from saturated fats, daily to have 5 or more servings of fruits and vegetables.  Biometrics: Pre Biometrics - 01/09/18 1144    Pre Biometrics          Height  5' 11.25" (1.81 m)    Weight  86.3 kg    Waist Circumference  37.5 inches    Hip Circumference   39 inches    Waist to Hip Ratio  0.96 %    BMI (Calculated)  26.34    Triceps Skinfold  13 mm    % Body Fat  25 %    Grip Strength  38 kg    Flexibility  8 in    Single Leg Stand  30 seconds            Nutrition Therapy Plan and Nutrition Goals: Nutrition Therapy & Goals - 01/09/18 1150    Nutrition Therapy          Diet  heart healthy        Personal Nutrition Goals          Nutrition Goal  Pt to identify and limit food sources of saturated fat, trans fat, refined carbohydrates and sodium        Intervention Plan          Intervention  Prescribe, educate and counsel regarding individualized specific dietary modifications aiming towards targeted core components such as weight, hypertension, lipid management, diabetes, heart failure and other comorbidities.    Expected Outcomes  Short Term Goal: Understand basic principles of dietary content, such as calories, fat, sodium, cholesterol and nutrients.           Nutrition Assessments: Nutrition Assessments - 01/09/18 1151    MEDFICTS Scores          Pre Score  23           Nutrition Goals Re-Evaluation:   Nutrition Goals Re-Evaluation:  Nutrition Goals Discharge (Final Nutrition Goals Re-Evaluation):   Psychosocial: Target Goals: Acknowledge presence or absence of significant depression and/or stress, maximize coping skills, provide positive support system. Participant is able to verbalize types and ability to use techniques and skills needed for reducing stress and depression.  Initial Review & Psychosocial Screening: Initial Psych Review & Screening - 01/09/18 1141    Initial Review          Current issues with  None Identified        Family Dynamics          Good Support System?  Yes   Zyaire reports his wife as a source of support.       Barriers          Psychosocial barriers to participate in program  There are no identifiable barriers or psychosocial needs.        Screening Interventions           Interventions  Encouraged to exercise           Quality of Life Scores: Quality of Life - 01/09/18 1142    Quality of Life          Select  Quality of Life        Quality of Life Scores          Health/Function Pre  28.2 %    Socioeconomic Pre  27.08 %    Psych/Spiritual Pre  30 %    Family Pre  28.3 %    GLOBAL Pre  28.39 %          Scores of 19 and below usually indicate a poorer quality of life in these areas.  A difference of  2-3 points is a clinically meaningful difference.  A difference of 2-3 points in the total score of the Quality of Life Index has been associated with significant improvement in overall quality of life, self-image, physical symptoms, and general health in studies assessing change in quality of life.  PHQ-9: Recent Review Flowsheet Data    Depression screen [facility] 2/9 01/04/2015   Decreased Interest 0   Down, Depressed, Hopeless 0   PHQ - 2 Score 0     Interpretation of Total Score  Total Score Depression Severity:  1-4 = Minimal depression, 5-9 = Mild depression, 10-14 = Moderate depression, 15-19 = Moderately severe depression, 20-27 = Severe depression   Psychosocial Evaluation and Intervention:   Psychosocial Re-Evaluation:   Psychosocial Discharge (Final Psychosocial Re-Evaluation):   Vocational Rehabilitation: Provide vocational rehab assistance to qualifying candidates.   Vocational Rehab Evaluation & Intervention: Vocational Rehab - 01/09/18 1142    Initial Vocational Rehab Evaluation & Intervention          Assessment shows need for Vocational Rehabilitation  No           Education: Education Goals: Education classes will be provided on a weekly basis, covering required topics. Participant will state understanding/return demonstration of topics presented.  Learning Barriers/Preferences: Learning Barriers/Preferences - 01/09/18 1153    Learning Barriers/Preferences          Learning Barriers  Sight     Learning Preferences  Written Material;Video;Skilled Demonstration           Education Topics: Count Your Pulse:  -Group instruction provided by verbal instruction, demonstration, patient participation and written materials to support subject.  Instructors address importance of being able to find your pulse and how to count your pulse when at home without a heart  monitor.  Patients get hands on experience counting their pulse with staff help and individually.   Heart Attack, Angina, and Risk Factor Modification:  -Group instruction provided by verbal instruction, video, and written materials to support subject.  Instructors address signs and symptoms of angina and heart attacks.    Also discuss risk factors for heart disease and how to make changes to improve heart health risk factors.   Functional Fitness:  -Group instruction provided by verbal instruction, demonstration, patient participation, and written materials to support subject.  Instructors address safety measures for doing things around the house.  Discuss how to get up and down off the floor, how to pick things up properly, how to safely get out of a chair without assistance, and balance training.   Meditation and Mindfulness:  -Group instruction provided by verbal instruction, patient participation, and written materials to support subject.  Instructor addresses importance of mindfulness and meditation practice to help reduce stress and improve awareness.  Instructor also leads participants through a meditation exercise.    Stretching for Flexibility and Mobility:  -Group instruction provided by verbal instruction, patient participation, and written materials to support subject.  Instructors lead participants through series of stretches that are designed to increase flexibility thus improving mobility.  These stretches are additional exercise for major muscle groups that are typically performed during regular warm up and cool  down.   Hands Only CPR:  -Group verbal, video, and participation provides a basic overview of AHA guidelines for community CPR. Role-play of emergencies allow participants the opportunity to practice calling for help and chest compression technique with discussion of AED use.   Hypertension: -Group verbal and written instruction that provides a basic overview of hypertension including the most recent diagnostic guidelines, risk factor reduction with self-care instructions and medication management.    Nutrition I class: Heart Healthy Eating:  -Group instruction provided by PowerPoint slides, verbal discussion, and written materials to support subject matter. The instructor gives an explanation and review of the Therapeutic Lifestyle Changes diet recommendations, which includes a discussion on lipid goals, dietary fat, sodium, fiber, plant stanol/sterol esters, sugar, and the components of a well-balanced, healthy diet.   Nutrition II class: Lifestyle Skills:  -Group instruction provided by PowerPoint slides, verbal discussion, and written materials to support subject matter. The instructor gives an explanation and review of label reading, grocery shopping for heart health, heart healthy recipe modifications, and ways to make healthier choices when eating out.   Diabetes Question & Answer:  -Group instruction provided by PowerPoint slides, verbal discussion, and written materials to support subject matter. The instructor gives an explanation and review of diabetes co-morbidities, pre- and post-prandial blood glucose goals, pre-exercise blood glucose goals, signs, symptoms, and treatment of hypoglycemia and hyperglycemia, and foot care basics.   Diabetes Blitz:  -Group instruction provided by PowerPoint slides, verbal discussion, and written materials to support subject matter. The instructor gives an explanation and review of the physiology behind type 1 and type 2 diabetes, diabetes  medications and rational behind using different medications, pre- and post-prandial blood glucose recommendations and Hemoglobin A1c goals, diabetes diet, and exercise including blood glucose guidelines for exercising safely.    Portion Distortion:  -Group instruction provided by PowerPoint slides, verbal discussion, written materials, and food models to support subject matter. The instructor gives an explanation of serving size versus portion size, changes in portions sizes over the last 20 years, and what consists of a serving from each food group.   Stress  Management:  -Group instruction provided by verbal instruction, video, and written materials to support subject matter.  Instructors review role of stress in heart disease and how to cope with stress positively.     Exercising on Your Own:  -Group instruction provided by verbal instruction, power point, and written materials to support subject.  Instructors discuss benefits of exercise, components of exercise, frequency and intensity of exercise, and end points for exercise.  Also discuss use of nitroglycerin and activating EMS.  Review options of places to exercise outside of rehab.  Review guidelines for sex with heart disease.   Cardiac Drugs I:  -Group instruction provided by verbal instruction and written materials to support subject.  Instructor reviews cardiac drug classes: antiplatelets, anticoagulants, beta blockers, and statins.  Instructor discusses reasons, side effects, and lifestyle considerations for each drug class.   Cardiac Drugs II:  -Group instruction provided by verbal instruction and written materials to support subject.  Instructor reviews cardiac drug classes: angiotensin converting enzyme inhibitors (ACE-I), angiotensin II receptor blockers (ARBs), nitrates, and calcium channel blockers.  Instructor discusses reasons, side effects, and lifestyle considerations for each drug class.   Anatomy and Physiology of the  Circulatory System:  Group verbal and written instruction and models provide basic cardiac anatomy and physiology, with the coronary electrical and arterial systems. Review of: AMI, Angina, Valve disease, Heart Failure, Peripheral Artery Disease, Cardiac Arrhythmia, Pacemakers, and the ICD.   Other Education:  -Group or individual verbal, written, or video instructions that support the educational goals of the cardiac rehab program.   Holiday Eating Survival Tips:  -Group instruction provided by PowerPoint slides, verbal discussion, and written materials to support subject matter. The instructor gives patients tips, tricks, and techniques to help them not only survive but enjoy the holidays despite the onslaught of food that accompanies the holidays.   Knowledge Questionnaire Score: Knowledge Questionnaire Score - 01/09/18 1142    Knowledge Questionnaire Score          Pre Score  --   Missing questions needs to complete          Core Components/Risk Factors/Patient Goals at Admission: Personal Goals and Risk Factors at Admission - 01/09/18 1331    Core Components/Risk Factors/Patient Goals on Admission           Weight Management  Yes;Weight Loss    Intervention  Weight Management: Develop a combined nutrition and exercise program designed to reach desired caloric intake, while maintaining appropriate intake of nutrient and fiber, sodium and fats, and appropriate energy expenditure required for the weight goal.;Weight Management: Provide education and appropriate resources to help participant work on and attain dietary goals.;Weight Management/Obesity: Establish reasonable short term and long term weight goals.;Obesity: Provide education and appropriate resources to help participant work on and attain dietary goals.    Admit Weight  211 lb 6.7 oz (95.9 kg)    Goal Weight: Long Term  200 lb (90.7 kg)    Expected Outcomes  Long Term: Adherence to nutrition and physical activity/exercise  program aimed toward attainment of established weight goal;Short Term: Continue to assess and modify interventions until short term weight is achieved;Weight Loss: Understanding of general recommendations for a balanced deficit meal plan, which promotes 1-2 lb weight loss per week and includes a negative energy balance of [phone] kcal/d;Understanding recommendations for meals to include 15-35% energy as protein, 25-35% energy from fat, 35-60% energy from carbohydrates, less than 200mg  of dietary cholesterol, 20-35 gm of total fiber daily;Understanding of distribution  of calorie intake throughout the day with the consumption of 4-5 meals/snacks    Hypertension  Yes    Intervention  Provide education on lifestyle modifcations including regular physical activity/exercise, weight management, moderate sodium restriction and increased consumption of fresh fruit, vegetables, and low fat dairy, alcohol moderation, and smoking cessation.;Monitor prescription use compliance.    Expected Outcomes  Short Term: Continued assessment and intervention until BP is < 140/29mm HG in hypertensive participants. < 130/10mm HG in hypertensive participants with diabetes, heart failure or chronic kidney disease.;Long Term: Maintenance of blood pressure at goal levels.    Lipids  Yes    Intervention  Provide education and support for participant on nutrition & aerobic/resistive exercise along with prescribed medications to achieve LDL 70mg , HDL >40mg .    Expected Outcomes  Short Term: Participant states understanding of desired cholesterol values and is compliant with medications prescribed. Participant is following exercise prescription and nutrition guidelines.;Long Term: Cholesterol controlled with medications as prescribed, with individualized exercise RX and with personalized nutrition plan. Value goals: LDL < 70mg , HDL > 40 mg.           Core Components/Risk Factors/Patient Goals Review:    Core Components/Risk  Factors/Patient Goals at Discharge (Final Review):    ITP Comments: ITP Comments    Row Name 01/09/18 1137 01/17/18 1548   ITP Comments  Dr. Armanda Magic, Medical Director  30 day ITP review.        Comments:

## 2018-01-20 ENCOUNTER — Encounter (HOSPITAL_COMMUNITY): Payer: Self-pay

## 2018-01-20 ENCOUNTER — Encounter (HOSPITAL_COMMUNITY)
Admission: RE | Admit: 2018-01-20 | Discharge: 2018-01-20 | Disposition: A | Discharge status: Home / Self Care | Payer: Medicare Other | Source: Ambulatory Visit | Attending: Cardiovascular Disease | Admitting: Cardiovascular Disease

## 2018-01-20 DIAGNOSIS — Z955 Presence of coronary angioplasty implant and graft: Secondary | ICD-10-CM | POA: Diagnosis present

## 2018-01-20 DIAGNOSIS — I214 Non-ST elevation (NSTEMI) myocardial infarction: Secondary | ICD-10-CM

## 2018-01-20 DIAGNOSIS — Z9861 Coronary angioplasty status: Secondary | ICD-10-CM

## 2018-01-20 DIAGNOSIS — I2510 Atherosclerotic heart disease of native coronary artery without angina pectoris: Secondary | ICD-10-CM

## 2018-01-20 DIAGNOSIS — Z48812 Encounter for surgical aftercare following surgery on the circulatory system: Secondary | ICD-10-CM | POA: Insufficient documentation

## 2018-01-20 NOTE — Progress Notes (Signed)
Daily Session Note  Patient Details  Name: Kevin Wade MRN: [id number] Date of Birth: 1950/06/09 Referring Provider:   Flowsheet Row CARDIAC REHAB PHASE II ORIENTATION from 01/09/2018 in [place]  Referring Provider  Sanda Klein MD       Encounter Date: 01/20/2018  Check In:   Capillary Blood Glucose: No results found for this or any previous visit (from the past 24 hour(s)).    Social History   Tobacco Use  Smoking Status Never Smoker  Smokeless Tobacco Never Used    Goals Met:  Exercise tolerated well  Goals Unmet:  Not Applicable  Comments: Pt started cardiac rehab today.  Pt tolerated light exercise without difficulty. VSS, telemetry-sinus bradycardia at rest, lowest rate-50.  Pt asymptomatic.  Medication list reconciled. Pt denies barriers to medicaiton compliance.  PSYCHOSOCIAL ASSESSMENT:  PHQ-0.  Pt exhibits positive coping skills, hopeful outlook with supportive family. No psychosocial needs identified at this time, no psychosocial interventions necessary.  Pt oriented to exercise equipment and routine. Understanding verbalized.     Dr. Fransico Him is Medical Director for Cardiac Rehab at [facility].

## 2018-01-22 ENCOUNTER — Encounter (HOSPITAL_COMMUNITY)
Admission: RE | Admit: 2018-01-22 | Discharge: 2018-01-22 | Disposition: A | Discharge status: Home / Self Care | Payer: Medicare Other | Source: Ambulatory Visit | Attending: Cardiovascular Disease | Admitting: Cardiovascular Disease

## 2018-01-22 DIAGNOSIS — Z48812 Encounter for surgical aftercare following surgery on the circulatory system: Secondary | ICD-10-CM | POA: Diagnosis not present

## 2018-01-22 DIAGNOSIS — Z9861 Coronary angioplasty status: Secondary | ICD-10-CM

## 2018-01-22 DIAGNOSIS — I2510 Atherosclerotic heart disease of native coronary artery without angina pectoris: Secondary | ICD-10-CM

## 2018-01-22 DIAGNOSIS — I214 Non-ST elevation (NSTEMI) myocardial infarction: Secondary | ICD-10-CM

## 2018-01-24 ENCOUNTER — Encounter (HOSPITAL_COMMUNITY)
Admission: RE | Admit: 2018-01-24 | Discharge: 2018-01-24 | Disposition: A | Discharge status: Home / Self Care | Payer: Medicare Other | Source: Ambulatory Visit | Attending: Cardiovascular Disease | Admitting: Cardiovascular Disease

## 2018-01-24 DIAGNOSIS — Z9861 Coronary angioplasty status: Secondary | ICD-10-CM

## 2018-01-24 DIAGNOSIS — Z48812 Encounter for surgical aftercare following surgery on the circulatory system: Secondary | ICD-10-CM | POA: Diagnosis not present

## 2018-01-24 DIAGNOSIS — I214 Non-ST elevation (NSTEMI) myocardial infarction: Secondary | ICD-10-CM

## 2018-01-24 DIAGNOSIS — I2510 Atherosclerotic heart disease of native coronary artery without angina pectoris: Secondary | ICD-10-CM

## 2018-01-27 ENCOUNTER — Encounter (HOSPITAL_COMMUNITY)
Admission: RE | Admit: 2018-01-27 | Discharge: 2018-01-27 | Disposition: A | Discharge status: Home / Self Care | Payer: Medicare Other | Source: Ambulatory Visit | Attending: Cardiovascular Disease | Admitting: Cardiovascular Disease

## 2018-01-27 DIAGNOSIS — I2510 Atherosclerotic heart disease of native coronary artery without angina pectoris: Secondary | ICD-10-CM

## 2018-01-27 DIAGNOSIS — Z48812 Encounter for surgical aftercare following surgery on the circulatory system: Secondary | ICD-10-CM | POA: Diagnosis not present

## 2018-01-27 DIAGNOSIS — I214 Non-ST elevation (NSTEMI) myocardial infarction: Secondary | ICD-10-CM

## 2018-01-27 DIAGNOSIS — Z9861 Coronary angioplasty status: Secondary | ICD-10-CM

## 2018-01-29 ENCOUNTER — Encounter (HOSPITAL_COMMUNITY)
Admission: RE | Admit: 2018-01-29 | Discharge: 2018-01-29 | Disposition: A | Discharge status: Home / Self Care | Payer: Medicare Other | Source: Ambulatory Visit | Attending: Cardiovascular Disease | Admitting: Cardiovascular Disease

## 2018-01-29 DIAGNOSIS — I2510 Atherosclerotic heart disease of native coronary artery without angina pectoris: Secondary | ICD-10-CM

## 2018-01-29 DIAGNOSIS — Z48812 Encounter for surgical aftercare following surgery on the circulatory system: Secondary | ICD-10-CM | POA: Diagnosis not present

## 2018-01-29 DIAGNOSIS — I214 Non-ST elevation (NSTEMI) myocardial infarction: Secondary | ICD-10-CM

## 2018-01-29 DIAGNOSIS — Z9861 Coronary angioplasty status: Secondary | ICD-10-CM

## 2018-01-31 ENCOUNTER — Encounter (HOSPITAL_COMMUNITY)
Admission: RE | Admit: 2018-01-31 | Discharge: 2018-01-31 | Disposition: A | Discharge status: Home / Self Care | Payer: Medicare Other | Source: Ambulatory Visit | Attending: Cardiovascular Disease | Admitting: Cardiovascular Disease

## 2018-01-31 DIAGNOSIS — Z48812 Encounter for surgical aftercare following surgery on the circulatory system: Secondary | ICD-10-CM | POA: Diagnosis not present

## 2018-01-31 DIAGNOSIS — I2510 Atherosclerotic heart disease of native coronary artery without angina pectoris: Secondary | ICD-10-CM

## 2018-01-31 DIAGNOSIS — I214 Non-ST elevation (NSTEMI) myocardial infarction: Secondary | ICD-10-CM

## 2018-01-31 DIAGNOSIS — Z9861 Coronary angioplasty status: Secondary | ICD-10-CM

## 2018-02-03 ENCOUNTER — Encounter (HOSPITAL_COMMUNITY)
Admission: RE | Admit: 2018-02-03 | Discharge: 2018-02-03 | Disposition: A | Discharge status: Home / Self Care | Payer: Medicare Other | Source: Ambulatory Visit | Attending: Cardiovascular Disease | Admitting: Cardiovascular Disease

## 2018-02-03 DIAGNOSIS — Z9861 Coronary angioplasty status: Secondary | ICD-10-CM

## 2018-02-03 DIAGNOSIS — Z48812 Encounter for surgical aftercare following surgery on the circulatory system: Secondary | ICD-10-CM | POA: Diagnosis not present

## 2018-02-03 DIAGNOSIS — I2510 Atherosclerotic heart disease of native coronary artery without angina pectoris: Secondary | ICD-10-CM

## 2018-02-03 DIAGNOSIS — I214 Non-ST elevation (NSTEMI) myocardial infarction: Secondary | ICD-10-CM

## 2018-02-05 ENCOUNTER — Encounter (HOSPITAL_COMMUNITY)
Admission: RE | Admit: 2018-02-05 | Discharge: 2018-02-05 | Disposition: A | Discharge status: Home / Self Care | Payer: Medicare Other | Source: Ambulatory Visit | Attending: Cardiovascular Disease | Admitting: Cardiovascular Disease

## 2018-02-05 DIAGNOSIS — I214 Non-ST elevation (NSTEMI) myocardial infarction: Secondary | ICD-10-CM

## 2018-02-05 DIAGNOSIS — I2510 Atherosclerotic heart disease of native coronary artery without angina pectoris: Secondary | ICD-10-CM

## 2018-02-05 DIAGNOSIS — Z48812 Encounter for surgical aftercare following surgery on the circulatory system: Secondary | ICD-10-CM | POA: Diagnosis not present

## 2018-02-05 DIAGNOSIS — Z9861 Coronary angioplasty status: Secondary | ICD-10-CM

## 2018-02-05 NOTE — Progress Notes (Signed)
Kevin Wade 67 y.o. male Nutrition Note Spoke with pt. Nutrition plan and goals reviewed with pt. Pt is following heart healthy diet. Additional heart healthy tips reviewed (label reading with attention to heart healthy fats vs fats that are more unhealthy, how to build a healthy plate, portion sizes, eating frequently across the day). Additionally discussed recipes and web sites to find and try new heart healthy recipes, distributed recipes to patient to try. Per discussion, pt does not use canned/convenience foods often. Pt rarely adds salt to food. Pt eats out infrequently. Pt expressed understanding of the information reviewed. Pt aware of nutrition education classes offered and plans on attending nutrition classes.   No results found for: HGBA1C  Wt Readings from Last 3 Encounters:  01/09/18 190 lb 4.1 oz (86.3 kg)  12/10/17 186 lb (84.4 kg)  11/22/17 184 lb (83.5 kg)    Nutrition Diagnosis ? Food-and nutrition-related knowledge deficit related to lack of exposure to information as related to diagnosis of: ? CVD    Nutrition Intervention ? Pt's individual nutrition plan reviewed with pt. ? Benefits of adopting Heart Healthy diet discussed when [employer] reviewed.   ? Pt given handouts for:  Recipes  Goal(s) ? Pt to identify and limit food sources of saturated fat, trans fat, refined carbohydrates and sodium ? Pt to try new heart healthy recipes  Plan:   Pt to attend nutrition classes ? Nutrition I ? Nutrition II ? Portion Distortion   Will provide client-centered nutrition education as part of interdisciplinary care  Monitor and evaluate progress toward nutrition goal with team.    Ross Marcus, MS, RD, LDN 02/05/2018 2:27 PM

## 2018-02-07 ENCOUNTER — Encounter (HOSPITAL_COMMUNITY)
Admission: RE | Admit: 2018-02-07 | Discharge: 2018-02-07 | Disposition: A | Discharge status: Home / Self Care | Payer: Medicare Other | Source: Ambulatory Visit | Attending: Cardiovascular Disease | Admitting: Cardiovascular Disease

## 2018-02-07 DIAGNOSIS — I214 Non-ST elevation (NSTEMI) myocardial infarction: Secondary | ICD-10-CM

## 2018-02-07 DIAGNOSIS — Z48812 Encounter for surgical aftercare following surgery on the circulatory system: Secondary | ICD-10-CM | POA: Diagnosis not present

## 2018-02-07 DIAGNOSIS — I2510 Atherosclerotic heart disease of native coronary artery without angina pectoris: Secondary | ICD-10-CM

## 2018-02-07 DIAGNOSIS — Z9861 Coronary angioplasty status: Secondary | ICD-10-CM

## 2018-02-10 ENCOUNTER — Encounter (HOSPITAL_COMMUNITY)
Admission: RE | Admit: 2018-02-10 | Discharge: 2018-02-10 | Disposition: A | Discharge status: Home / Self Care | Payer: Medicare Other | Source: Ambulatory Visit | Attending: Cardiovascular Disease | Admitting: Cardiovascular Disease

## 2018-02-10 DIAGNOSIS — I2510 Atherosclerotic heart disease of native coronary artery without angina pectoris: Secondary | ICD-10-CM

## 2018-02-10 DIAGNOSIS — Z9861 Coronary angioplasty status: Secondary | ICD-10-CM

## 2018-02-10 DIAGNOSIS — I214 Non-ST elevation (NSTEMI) myocardial infarction: Secondary | ICD-10-CM

## 2018-02-10 DIAGNOSIS — Z48812 Encounter for surgical aftercare following surgery on the circulatory system: Secondary | ICD-10-CM | POA: Diagnosis not present

## 2018-02-12 ENCOUNTER — Encounter (HOSPITAL_COMMUNITY)
Admission: RE | Admit: 2018-02-12 | Discharge: 2018-02-12 | Disposition: A | Discharge status: Home / Self Care | Payer: Medicare Other | Source: Ambulatory Visit | Attending: Cardiovascular Disease | Admitting: Cardiovascular Disease

## 2018-02-12 DIAGNOSIS — Z955 Presence of coronary angioplasty implant and graft: Secondary | ICD-10-CM | POA: Diagnosis present

## 2018-02-12 DIAGNOSIS — I214 Non-ST elevation (NSTEMI) myocardial infarction: Secondary | ICD-10-CM

## 2018-02-12 DIAGNOSIS — Z48812 Encounter for surgical aftercare following surgery on the circulatory system: Secondary | ICD-10-CM | POA: Diagnosis not present

## 2018-02-12 DIAGNOSIS — Z9861 Coronary angioplasty status: Secondary | ICD-10-CM

## 2018-02-12 DIAGNOSIS — I2510 Atherosclerotic heart disease of native coronary artery without angina pectoris: Secondary | ICD-10-CM

## 2018-02-12 NOTE — Progress Notes (Signed)
Entry blood pressure noted at 142/72 today. Heart rate 59. [place] was noted at 198/72 on the airdyne. Pt was moved to the walking track. Repeat blood pressure 162/70.  Blood pressures were noted at 142/84 then 168/80 on the nustep. Exit blood pressure noted at 128/70. Heart rate 58. Georgie Chard NP called and notified. No new orders received. Will fax exercise flow sheets to Dr. Erin Hearing office for review. Patient exercised today without complaints.Will continue to monitor the patient throughout  the program.Morrell Fluke Harlon Flor, RN,BSN 02/12/2018 4:54 PM

## 2018-02-13 NOTE — Progress Notes (Signed)
Cardiac Individual Treatment Plan  Patient Details  Name: Kevin Wade MRN: [id number] Date of Birth: 1950-08-11 Referring Provider:   Flowsheet Row CARDIAC REHAB PHASE II ORIENTATION from 01/09/2018 in [facility] CARDIAC [facility]  Referring Provider  Croitoru, Mihai MD       Initial Encounter Date:  Flowsheet Row CARDIAC REHAB PHASE II ORIENTATION from 01/09/2018 in [facility] CARDIAC REHAB  Date  01/09/18      Visit Diagnosis: Non-ST elevation (NSTEMI) myocardial infarction [facility])  CAD S/P percutaneous coronary angioplasty  Patient's Home Medications on Admission:  Current Outpatient Medications:  .  aspirin EC 81 MG tablet, Take 81 mg by mouth daily., Disp: , Rfl:  .  atorvastatin (LIPITOR) 80 MG tablet, Take 1 tablet (80 mg total) by mouth daily at 6 PM., Disp: 30 tablet, Rfl: 0 .  famotidine (PEPCID) 20 MG tablet, Take 20 mg by mouth 2 (two) times daily., Disp: , Rfl:  .  lisinopril (PRINIVIL,ZESTRIL) 20 MG tablet, Take 20 mg by mouth. , Disp: , Rfl:  .  Misc Natural Products (OSTEO BI-FLEX JOINT SHIELD) TABS, Take 1 tablet by mouth daily., Disp: , Rfl:  .  Multiple Vitamins-Minerals (ONE-A-DAY MENS 50+ ADVANTAGE) TABS, Take 1 tablet by mouth daily., Disp: , Rfl:  .  nitroGLYCERIN (NITROSTAT) 0.4 MG SL tablet, Place 1 tablet (0.4 mg total) under the tongue every 5 (five) minutes x 3 doses as needed for chest pain., Disp: 20 tablet, Rfl: 0 .  Omega-3 1000 MG CAPS, Take 1,000 mg by mouth daily., Disp: , Rfl:  .  ticagrelor (BRILINTA) 90 MG TABS tablet, Take 1 tablet (90 mg total) by mouth 2 (two) times daily., Disp: 180 tablet, Rfl: 1  Past Medical History: Past Medical History:  Diagnosis Date  . Arthritis   . Hyperlipidemia   . Hypertension     Tobacco Use: Social History   Tobacco Use  Smoking Status Never Smoker  Smokeless Tobacco Never Used    Labs: Recent Review Advice worker    Labs for ITP Cardiac and Pulmonary Rehab  Latest Ref Rng & Units 11/14/2017   Cholestrol 0 - 200 mg/dL 782   LDLCALC 0 - 99 mg/dL 80   HDL >95 mg/dL 47   Trlycerides <621 mg/dL 82      Capillary Blood Glucose: No results found for: GLUCAP   Exercise Target Goals: Exercise Program Goal: Individual exercise prescription set using results from initial 6 min walk test and THRR while considering  patient's activity barriers and safety.   Exercise Prescription Goal: Initial exercise prescription builds to 30-45 minutes a day of aerobic activity, 2-3 days per week.  Home exercise guidelines will be given to patient during program as part of exercise prescription that the participant will acknowledge.  Activity Barriers & Risk Stratification: Activity Barriers & Cardiac Risk Stratification - 01/09/18 1144    Activity Barriers & Cardiac Risk Stratification          Activity Barriers  Joint Problems;Other (comment)    Comments  Left Shoulder Chronic Pain, Left Bicep Tear    Cardiac Risk Stratification  Moderate           6 Minute Walk: 6 Minute Walk    6 Minute Walk    Row Name 01/09/18 1143   Phase  Initial   Distance  1584 feet   Walk Time  6 minutes   # of Rest Breaks  0   MPH  3   METS  2.99   RPE  12   Perceived Dyspnea   0   VO2 Peak  10.5   Symptoms  No   Resting HR  50 bpm   Resting BP  120/62   Resting Oxygen Saturation   98 %   Exercise Oxygen Saturation  during 6 min walk  99 %   Max Ex. HR  88 bpm   Max Ex. BP  128/80   2 Minute Post BP  116/70          Oxygen Initial Assessment:   Oxygen Re-Evaluation:   Oxygen Discharge (Final Oxygen Re-Evaluation):   Initial Exercise Prescription: Initial Exercise Prescription - 01/09/18 1100    Date of Initial Exercise RX and Referring Provider          Date  01/09/18    Referring Provider  Thurmon Fair MD     Expected Discharge Date  04/04/18        Treadmill          MPH  2.4    Grade  0    Minutes  10    METs  2.84        Recumbant  Bike          Level  2    Watts  30    Minutes  10    METs  3.11        NuStep          Level  2    SPM  80    Minutes  10    METs  2.7        Prescription Details          Frequency (times per week)  3x    Duration  Progress to 30 minutes of continuous aerobic without signs/symptoms of physical distress        Intensity          THRR 40-80% of Max Heartrate  61-122    Ratings of Perceived Exertion  11-13    Perceived Dyspnea  0-4        Progression          Progression  Continue progressive overload as per policy without signs/symptoms or physical distress.        Resistance Training          Training Prescription  Yes    Weight  3lbs    Reps  10-15           Perform Capillary Blood Glucose checks as needed.  Exercise Prescription Changes: Exercise Prescription Changes    Response to Exercise    Row Name 01/29/18 1328 02/10/18 1328   Blood Pressure (Admit)  110/66  142/82   Blood Pressure (Exercise)  140/80  164/72   Blood Pressure (Exit)  118/68  122/80   Heart Rate (Admit)  53 bpm  52 bpm   Heart Rate (Exercise)  104 bpm  98 bpm   Heart Rate (Exit)  51 bpm  50 bpm   Rating of Perceived Exertion (Exercise)  11  11   Symptoms  none  none   Duration  Progress to 30 minutes of  aerobic without signs/symptoms of physical distress  Progress to 30 minutes of  aerobic without signs/symptoms of physical distress   Intensity  THRR unchanged  THRR unchanged       Progression    Row Name 01/29/18 1328 02/10/18 1328   Progression  Continue to progress workloads to maintain intensity  without signs/symptoms of physical distress.  Continue to progress workloads to maintain intensity without signs/symptoms of physical distress.   Average METs  3.6  4.1       Resistance Training    Row Name 01/29/18 1328 02/10/18 1328   Training Prescription  No Relaxation day, no weights.  Yes   Weight  no documentation  4lbs   Reps  no documentation  10-15   Time  no  documentation  10 Minutes       Interval Training    Row Name 01/29/18 1328 02/10/18 1328   Interval Training  No  No       Treadmill    Row Name 01/29/18 1328 02/10/18 1328   MPH  2.6  3   Grade  1  1   Minutes  10  10   METs  3.35  3.71       Recumbant Bike    Row Name 01/29/18 1328 02/10/18 1328   Level  4  4 Upright SciFit Bike   Watts  50  no documentation   Minutes  10  10   METs  3.85  no documentation       NuStep    Row Name 01/29/18 1328 02/10/18 1328   Level  4  4   SPM  80  85   Minutes  10  10   METs  3.5  4.4       Home Exercise Plan    Row Name 01/29/18 1328 02/10/18 1328   Plans to continue exercise at  Home (comment)  Home (comment)   Frequency  Add 3 additional days to program exercise sessions.  Add 3 additional days to program exercise sessions.   Initial Home Exercises Provided  01/29/18  01/29/18          Exercise Comments: Exercise Comments    Row Name 01/29/18 1353 02/10/18 1351   Exercise Comments  Reviewed home exericse guidelines, METs, and goals with patient.  Reviewed METs and goals with patient.      Exercise Goals and Review: Exercise Goals    Exercise Goals    Row Name 01/09/18 1145   Increase Physical Activity  Yes   Intervention  Develop an individualized exercise prescription for aerobic and resistive training based on initial evaluation findings, risk stratification, comorbidities and participant's personal goals.;Provide advice, education, support and counseling about physical activity/exercise needs.   Expected Outcomes  Short Term: Attend rehab on a regular basis to increase amount of physical activity.;Long Term: Add in home exercise to make exercise part of routine and to increase amount of physical activity.;Long Term: Exercising regularly at least 3-5 days a week.   Increase Strength and Stamina  Yes   Intervention  Provide advice, education, support and counseling about physical activity/exercise needs.;Develop an  individualized exercise prescription for aerobic and resistive training based on initial evaluation findings, risk stratification, comorbidities and participant's personal goals.   Expected Outcomes  Short Term: Increase workloads from initial exercise prescription for resistance, speed, and METs.;Short Term: Perform resistance training exercises routinely during rehab and add in resistance training at home;Long Term: Improve cardiorespiratory fitness, muscular endurance and strength as measured by increased METs and functional capacity ( )   Able to understand and use rate of perceived exertion (RPE) scale  Yes   Intervention  Provide education and explanation on how to use RPE scale   Expected Outcomes  Short Term: Able to use RPE daily in rehab to express subjective  intensity level;Long Term:  Able to use RPE to guide intensity level when exercising independently   Knowledge and understanding of Target Heart Rate Range (THRR)  Yes   Intervention  Provide education and explanation of THRR including how the numbers were predicted and where they are located for reference   Expected Outcomes  Short Term: Able to state/look up THRR;Long Term: Able to use THRR to govern intensity when exercising independently;Short Term: Able to use daily as guideline for intensity in rehab   Understanding of Exercise Prescription  Yes   Intervention  Provide education, explanation, and written materials on patient's individual exercise prescription   Expected Outcomes  Short Term: Able to explain program exercise prescription;Long Term: Able to explain home exercise prescription to exercise independently          Exercise Goals Re-Evaluation : Exercise Goals Re-Evaluation    Exercise Goal Re-Evaluation    Row Name 01/29/18 1353 02/10/18 1351   Exercise Goals Review  Understanding of Exercise Prescription;Knowledge and understanding of Target Heart Rate Range (THRR);Able to understand and use rate of perceived  exertion (RPE) scale  Understanding of Exercise Prescription;Knowledge and understanding of Target Heart Rate Range (THRR);Able to understand and use rate of perceived exertion (RPE) scale;Increase Strength and Stamina;Increase Physical Activity   Comments  Reviewed home exercise guidelines with patient including THRR, RPE scale and endpoints for exercise. Pt is walking 15-20 minutes, 3 days/week as his mode of home exercise. Encouraged pt to increase duration to 30 minutes, and pt is amenable to this.  Patient is walking 15 minutes and using home exercise equipment 15 minutes on Tuesday, Thursday, and Saturday. Pt is also dancing as a mode of exercise.    Expected Outcomes  Patient will increase walking duration at home to 30 minutes to help improve cardiorespiratory fitness.  Patient will increase workloads to help achieve personal health and fitness goals.          Discharge Exercise Prescription (Final Exercise Prescription Changes): Exercise Prescription Changes - 02/10/18 1328    Response to Exercise          Blood Pressure (Admit)  142/82    Blood Pressure (Exercise)  164/72    Blood Pressure (Exit)  122/80    Heart Rate (Admit)  52 bpm    Heart Rate (Exercise)  98 bpm    Heart Rate (Exit)  50 bpm    Rating of Perceived Exertion (Exercise)  11    Symptoms  none    Duration  Progress to 30 minutes of  aerobic without signs/symptoms of physical distress    Intensity  THRR unchanged        Progression          Progression  Continue to progress workloads to maintain intensity without signs/symptoms of physical distress.    Average METs  4.1        Resistance Training          Training Prescription  Yes    Weight  4lbs    Reps  10-15    Time  10 Minutes        Interval Training          Interval Training  No        Treadmill          MPH  3    Grade  1    Minutes  10    METs  3.71        Recumbant Bike  Level  4   Upright SciFit Bike   Minutes  10         NuStep          Level  4    SPM  85    Minutes  10    METs  4.4        Home Exercise Plan          Plans to continue exercise at  Home (comment)    Frequency  Add 3 additional days to program exercise sessions.    Initial Home Exercises Provided  01/29/18           Nutrition:  Target Goals: Understanding of nutrition guidelines, daily intake of sodium 1500mg , cholesterol 200mg , calories 30% from fat and 7% or less from saturated fats, daily to have 5 or more servings of fruits and vegetables.  Biometrics: Pre Biometrics - 01/09/18 1144    Pre Biometrics          Height  5' 11.25" (1.81 m)    Weight  86.3 kg    Waist Circumference  37.5 inches    Hip Circumference  39 inches    Waist to Hip Ratio  0.96 %    BMI (Calculated)  26.34    Triceps Skinfold  13 mm    % Body Fat  25 %    Grip Strength  38 kg    Flexibility  8 in    Single Leg Stand  30 seconds            Nutrition Therapy Plan and Nutrition Goals: Nutrition Therapy & Goals - 01/09/18 1150    Nutrition Therapy          Diet  heart healthy        Personal Nutrition Goals          Nutrition Goal  Pt to identify and limit food sources of saturated fat, trans fat, refined carbohydrates and sodium        Intervention Plan          Intervention  Prescribe, educate and counsel regarding individualized specific dietary modifications aiming towards targeted core components such as weight, hypertension, lipid management, diabetes, heart failure and other comorbidities.    Expected Outcomes  Short Term Goal: Understand basic principles of dietary content, such as calories, fat, sodium, cholesterol and nutrients.           Nutrition Assessments: Nutrition Assessments - 01/09/18 1151    MEDFICTS Scores          Pre Score  23           Nutrition Goals Re-Evaluation:   Nutrition Goals Re-Evaluation:   Nutrition Goals Discharge (Final Nutrition Goals  Re-Evaluation):   Psychosocial: Target Goals: Acknowledge presence or absence of significant depression and/or stress, maximize coping skills, provide positive support system. Participant is able to verbalize types and ability to use techniques and skills needed for reducing stress and depression.  Initial Review & Psychosocial Screening: Initial Psych Review & Screening - 01/09/18 1141    Initial Review          Current issues with  None Identified        Family Dynamics          Good Support System?  Yes   Milen reports his wife as a source of support.       Barriers          Psychosocial barriers to participate in program  There  are no identifiable barriers or psychosocial needs.        Screening Interventions          Interventions  Encouraged to exercise           Quality of Life Scores: Quality of Life - 01/09/18 1142    Quality of Life          Select  Quality of Life        Quality of Life Scores          Health/Function Pre  28.2 %    Socioeconomic Pre  27.08 %    Psych/Spiritual Pre  30 %    Family Pre  28.3 %    GLOBAL Pre  28.39 %          Scores of 19 and below usually indicate a poorer quality of life in these areas.  A difference of  2-3 points is a clinically meaningful difference.  A difference of 2-3 points in the total score of the Quality of Life Index has been associated with significant improvement in overall quality of life, self-image, physical symptoms, and general health in studies assessing change in quality of life.  PHQ-9: Recent Review Flowsheet Data    Depression screen [facility] 2/9 01/20/2018 01/04/2015   Decreased Interest 0 0   Down, Depressed, Hopeless 0 0   PHQ - 2 Score 0 0     Interpretation of Total Score  Total Score Depression Severity:  1-4 = Minimal depression, 5-9 = Mild depression, 10-14 = Moderate depression, 15-19 = Moderately severe depression, 20-27 = Severe depression   Psychosocial Evaluation and  Intervention: Psychosocial Evaluation - 01/20/18 1423    Psychosocial Evaluation & Interventions          Interventions  Encouraged to exercise with the program and follow exercise prescription    Comments  no psychsocial needs identified, no interventions necessary. pt enjoys swimming, playing pool and cards at home.     Expected Outcomes  pt will exhibit positive outlook with good coping skills.     Continue Psychosocial Services   No Follow up required           Psychosocial Re-Evaluation: Psychosocial Re-Evaluation    Psychosocial Re-Evaluation    Row Name 02/13/18 1105   Current issues with  None Identified   Comments  no psychosocial needs identified, no interventions necessary    Expected Outcomes  pt will exhibit positive outlook with good coping skills.    Interventions  Encouraged to attend Cardiac Rehabilitation for the exercise   Continue Psychosocial Services   No Follow up required          Psychosocial Discharge (Final Psychosocial Re-Evaluation): Psychosocial Re-Evaluation - 02/13/18 1105    Psychosocial Re-Evaluation          Current issues with  None Identified    Comments  no psychosocial needs identified, no interventions necessary     Expected Outcomes  pt will exhibit positive outlook with good coping skills.     Interventions  Encouraged to attend Cardiac Rehabilitation for the exercise    Continue Psychosocial Services   No Follow up required           Vocational Rehabilitation: Provide vocational rehab assistance to qualifying candidates.   Vocational Rehab Evaluation & Intervention: Vocational Rehab - 01/09/18 1142    Initial Vocational Rehab Evaluation & Intervention          Assessment shows need for Vocational Rehabilitation  No  Education: Education Goals: Education classes will be provided on a weekly basis, covering required topics. Participant will state understanding/return demonstration of topics presented.  Learning  Barriers/Preferences: Learning Barriers/Preferences - 01/09/18 1153    Learning Barriers/Preferences          Learning Barriers  Sight    Learning Preferences  Written Material;Video;Skilled Demonstration           Education Topics: Count Your Pulse:  -Group instruction provided by verbal instruction, demonstration, patient participation and written materials to support subject.  Instructors address importance of being able to find your pulse and how to count your pulse when at home without a heart monitor.  Patients get hands on experience counting their pulse with staff help and individually.   Heart Attack, Angina, and Risk Factor Modification:  -Group instruction provided by verbal instruction, video, and written materials to support subject.  Instructors address signs and symptoms of angina and heart attacks.    Also discuss risk factors for heart disease and how to make changes to improve heart health risk factors. Flowsheet Row CARDIAC REHAB PHASE II EXERCISE from 02/12/2018 in [facility] CARDIAC REHAB  Date  02/12/18  Instruction Review Code  2- Demonstrated Understanding      Functional Fitness:  -Group instruction provided by verbal instruction, demonstration, patient participation, and written materials to support subject.  Instructors address safety measures for doing things around the house.  Discuss how to get up and down off the floor, how to pick things up properly, how to safely get out of a chair without assistance, and balance training.   Meditation and Mindfulness:  -Group instruction provided by verbal instruction, patient participation, and written materials to support subject.  Instructor addresses importance of mindfulness and meditation practice to help reduce stress and improve awareness.  Instructor also leads participants through a meditation exercise.    Stretching for Flexibility and Mobility:  -Group instruction provided by verbal  instruction, patient participation, and written materials to support subject.  Instructors lead participants through series of stretches that are designed to increase flexibility thus improving mobility.  These stretches are additional exercise for major muscle groups that are typically performed during regular warm up and cool down.   Hands Only CPR:  -Group verbal, video, and participation provides a basic overview of AHA guidelines for community CPR. Role-play of emergencies allow participants the opportunity to practice calling for help and chest compression technique with discussion of AED use.   Hypertension: -Group verbal and written instruction that provides a basic overview of hypertension including the most recent diagnostic guidelines, risk factor reduction with self-care instructions and medication management.    Nutrition I class: Heart Healthy Eating:  -Group instruction provided by PowerPoint slides, verbal discussion, and written materials to support subject matter. The instructor gives an explanation and review of the Therapeutic Lifestyle Changes diet recommendations, which includes a discussion on lipid goals, dietary fat, sodium, fiber, plant stanol/sterol esters, sugar, and the components of a well-balanced, healthy diet.   Nutrition II class: Lifestyle Skills:  -Group instruction provided by PowerPoint slides, verbal discussion, and written materials to support subject matter. The instructor gives an explanation and review of label reading, grocery shopping for heart health, heart healthy recipe modifications, and ways to make healthier choices when eating out.   Diabetes Question & Answer:  -Group instruction provided by PowerPoint slides, verbal discussion, and written materials to support subject matter. The instructor gives an explanation and review of diabetes co-morbidities, pre-  and post-prandial blood glucose goals, pre-exercise blood glucose goals, signs, symptoms,  and treatment of hypoglycemia and hyperglycemia, and foot care basics.   Diabetes Blitz:  -Group instruction provided by PowerPoint slides, verbal discussion, and written materials to support subject matter. The instructor gives an explanation and review of the physiology behind type 1 and type 2 diabetes, diabetes medications and rational behind using different medications, pre- and post-prandial blood glucose recommendations and Hemoglobin A1c goals, diabetes diet, and exercise including blood glucose guidelines for exercising safely.    Portion Distortion:  -Group instruction provided by PowerPoint slides, verbal discussion, written materials, and food models to support subject matter. The instructor gives an explanation of serving size versus portion size, changes in portions sizes over the last 20 years, and what consists of a serving from each food group.   Stress Management:  -Group instruction provided by verbal instruction, video, and written materials to support subject matter.  Instructors review role of stress in heart disease and how to cope with stress positively.   Flowsheet Row CARDIAC REHAB PHASE II EXERCISE from 02/12/2018 in [facility] CARDIAC REHAB  Date  01/29/18  Instruction Review Code  2- Demonstrated Understanding      Exercising on Your Own:  -Group instruction provided by verbal instruction, power point, and written materials to support subject.  Instructors discuss benefits of exercise, components of exercise, frequency and intensity of exercise, and end points for exercise.  Also discuss use of nitroglycerin and activating EMS.  Review options of places to exercise outside of rehab.  Review guidelines for sex with heart disease.   Cardiac Drugs I:  -Group instruction provided by verbal instruction and written materials to support subject.  Instructor reviews cardiac drug classes: antiplatelets, anticoagulants, beta blockers, and statins.   Instructor discusses reasons, side effects, and lifestyle considerations for each drug class.   Cardiac Drugs II:  -Group instruction provided by verbal instruction and written materials to support subject.  Instructor reviews cardiac drug classes: angiotensin converting enzyme inhibitors (ACE-I), angiotensin II receptor blockers (ARBs), nitrates, and calcium channel blockers.  Instructor discusses reasons, side effects, and lifestyle considerations for each drug class.   Anatomy and Physiology of the Circulatory System:  Group verbal and written instruction and models provide basic cardiac anatomy and physiology, with the coronary electrical and arterial systems. Review of: AMI, Angina, Valve disease, Heart Failure, Peripheral Artery Disease, Cardiac Arrhythmia, Pacemakers, and the ICD. Flowsheet Row CARDIAC REHAB PHASE II EXERCISE from 02/12/2018 in [facility] CARDIAC REHAB  Date  02/05/18  Instruction Review Code  2- Demonstrated Understanding      Other Education:  -Group or individual verbal, written, or video instructions that support the educational goals of the cardiac rehab program.   Holiday Eating Survival Tips:  -Group instruction provided by PowerPoint slides, verbal discussion, and written materials to support subject matter. The instructor gives patients tips, tricks, and techniques to help them not only survive but enjoy the holidays despite the onslaught of food that accompanies the holidays.   Knowledge Questionnaire Score: Knowledge Questionnaire Score - 01/09/18 1142    Knowledge Questionnaire Score          Pre Score  --   Missing questions needs to complete          Core Components/Risk Factors/Patient Goals at Admission: Personal Goals and Risk Factors at Admission - 01/09/18 1331    Core Components/Risk Factors/Patient Goals on Admission  Weight Management  Yes;Weight Loss    Intervention  Weight Management: Develop a combined  nutrition and exercise program designed to reach desired caloric intake, while maintaining appropriate intake of nutrient and fiber, sodium and fats, and appropriate energy expenditure required for the weight goal.;Weight Management: Provide education and appropriate resources to help participant work on and attain dietary goals.;Weight Management/Obesity: Establish reasonable short term and long term weight goals.;Obesity: Provide education and appropriate resources to help participant work on and attain dietary goals.    Admit Weight  211 lb 6.7 oz (95.9 kg)    Goal Weight: Long Term  200 lb (90.7 kg)    Expected Outcomes  Long Term: Adherence to nutrition and physical activity/exercise program aimed toward attainment of established weight goal;Short Term: Continue to assess and modify interventions until short term weight is achieved;Weight Loss: Understanding of general recommendations for a balanced deficit meal plan, which promotes 1-2 lb weight loss per week and includes a negative energy balance of [phone] kcal/d;Understanding recommendations for meals to include 15-35% energy as protein, 25-35% energy from fat, 35-60% energy from carbohydrates, less than 200mg  of dietary cholesterol, 20-35 gm of total fiber daily;Understanding of distribution of calorie intake throughout the day with the consumption of 4-5 meals/snacks    Hypertension  Yes    Intervention  Provide education on lifestyle modifcations including regular physical activity/exercise, weight management, moderate sodium restriction and increased consumption of fresh fruit, vegetables, and low fat dairy, alcohol moderation, and smoking cessation.;Monitor prescription use compliance.    Expected Outcomes  Short Term: Continued assessment and intervention until BP is < 140/34mm HG in hypertensive participants. < 130/35mm HG in hypertensive participants with diabetes, heart failure or chronic kidney disease.;Long Term: Maintenance of blood  pressure at goal levels.    Lipids  Yes    Intervention  Provide education and support for participant on nutrition & aerobic/resistive exercise along with prescribed medications to achieve LDL 70mg , HDL >40mg .    Expected Outcomes  Short Term: Participant states understanding of desired cholesterol values and is compliant with medications prescribed. Participant is following exercise prescription and nutrition guidelines.;Long Term: Cholesterol controlled with medications as prescribed, with individualized exercise RX and with personalized nutrition plan. Value goals: LDL < 70mg , HDL > 40 mg.           Core Components/Risk Factors/Patient Goals Review:  Goals and Risk Factor Review    Core Components/Risk Factors/Patient Goals Review    Row Name 01/20/18 1426 02/13/18 1138   Personal Goals Review  Weight Management/Obesity;Hypertension;Lipids;Other  Weight Management/Obesity;Hypertension;Lipids;Other   Review  pt with multiple CAD RF demonstrates eagerness to participate in CR program. pt personal goals are to complete program learning heart healthy lifestyle and develop home exercise routine.   pt with multiple CAD RF demonstrates eagerness to participate in CR program. pt personal goals are to complete program learning heart healthy lifestyle and develop home exercise routine. pt notes walking at home is easier for him. pt personal goal is to play basketball with his grandchildren.    Expected Outcomes  pt will participate  in CR exercise, nutrition and lifestyle modification opportunites to decrease overall RF.   pt will participate  in CR exercise, nutrition and lifestyle modification opportunites to decrease overall RF.           Core Components/Risk Factors/Patient Goals at Discharge (Final Review):  Goals and Risk Factor Review - 02/13/18 1138    Core Components/Risk Factors/Patient Goals Review  Personal Goals Review  Weight Management/Obesity;Hypertension;Lipids;Other     Review  pt with multiple CAD RF demonstrates eagerness to participate in CR program. pt personal goals are to complete program learning heart healthy lifestyle and develop home exercise routine. pt notes walking at home is easier for him. pt personal goal is to play basketball with his grandchildren.     Expected Outcomes  pt will participate  in CR exercise, nutrition and lifestyle modification opportunites to decrease overall RF.            ITP Comments: ITP Comments    Row Name 01/09/18 1137 01/17/18 1548 01/20/18 1422 02/13/18 1104   ITP Comments  Dr. Armanda Magic, Medical Director  30 day ITP review.    pt started group exercise. pt tolerated light activity without difficulty. pt oriented to exercise equipment and safety routine.   30 day ITP review.       Comments:

## 2018-02-14 ENCOUNTER — Encounter (HOSPITAL_COMMUNITY)
Admission: RE | Admit: 2018-02-14 | Discharge: 2018-02-14 | Disposition: A | Discharge status: Home / Self Care | Payer: Medicare Other | Source: Ambulatory Visit | Attending: Cardiovascular Disease | Admitting: Cardiovascular Disease

## 2018-02-14 DIAGNOSIS — I2510 Atherosclerotic heart disease of native coronary artery without angina pectoris: Secondary | ICD-10-CM

## 2018-02-14 DIAGNOSIS — Z48812 Encounter for surgical aftercare following surgery on the circulatory system: Secondary | ICD-10-CM | POA: Diagnosis not present

## 2018-02-14 DIAGNOSIS — Z9861 Coronary angioplasty status: Secondary | ICD-10-CM

## 2018-02-14 DIAGNOSIS — I214 Non-ST elevation (NSTEMI) myocardial infarction: Secondary | ICD-10-CM

## 2018-02-17 ENCOUNTER — Encounter (HOSPITAL_COMMUNITY)
Admission: RE | Admit: 2018-02-17 | Discharge: 2018-02-17 | Disposition: A | Discharge status: Home / Self Care | Payer: Medicare Other | Source: Ambulatory Visit | Attending: Cardiovascular Disease | Admitting: Cardiovascular Disease

## 2018-02-17 DIAGNOSIS — Z9861 Coronary angioplasty status: Secondary | ICD-10-CM

## 2018-02-17 DIAGNOSIS — I214 Non-ST elevation (NSTEMI) myocardial infarction: Secondary | ICD-10-CM

## 2018-02-17 DIAGNOSIS — I2510 Atherosclerotic heart disease of native coronary artery without angina pectoris: Secondary | ICD-10-CM

## 2018-02-17 DIAGNOSIS — Z48812 Encounter for surgical aftercare following surgery on the circulatory system: Secondary | ICD-10-CM | POA: Diagnosis not present

## 2018-02-19 ENCOUNTER — Encounter (HOSPITAL_COMMUNITY)
Admission: RE | Admit: 2018-02-19 | Discharge: 2018-02-19 | Disposition: A | Discharge status: Home / Self Care | Payer: Medicare Other | Source: Ambulatory Visit | Attending: Cardiovascular Disease | Admitting: Cardiovascular Disease

## 2018-02-19 DIAGNOSIS — I2510 Atherosclerotic heart disease of native coronary artery without angina pectoris: Secondary | ICD-10-CM

## 2018-02-19 DIAGNOSIS — I214 Non-ST elevation (NSTEMI) myocardial infarction: Secondary | ICD-10-CM

## 2018-02-19 DIAGNOSIS — Z9861 Coronary angioplasty status: Secondary | ICD-10-CM

## 2018-02-19 DIAGNOSIS — Z48812 Encounter for surgical aftercare following surgery on the circulatory system: Secondary | ICD-10-CM | POA: Diagnosis not present

## 2018-02-21 ENCOUNTER — Encounter (HOSPITAL_COMMUNITY): Payer: Medicare Other

## 2018-02-24 ENCOUNTER — Encounter (HOSPITAL_COMMUNITY): Payer: Medicare Other

## 2018-02-26 ENCOUNTER — Encounter (HOSPITAL_COMMUNITY): Payer: Medicare Other

## 2018-02-28 ENCOUNTER — Encounter (HOSPITAL_COMMUNITY): Payer: Medicare Other

## 2018-03-03 ENCOUNTER — Encounter (HOSPITAL_COMMUNITY): Payer: Medicare Other

## 2018-03-05 ENCOUNTER — Encounter (HOSPITAL_COMMUNITY)
Admission: RE | Admit: 2018-03-05 | Discharge: 2018-03-05 | Disposition: A | Discharge status: Home / Self Care | Payer: Medicare Other | Source: Ambulatory Visit | Attending: Cardiovascular Disease | Admitting: Cardiovascular Disease

## 2018-03-05 DIAGNOSIS — I214 Non-ST elevation (NSTEMI) myocardial infarction: Secondary | ICD-10-CM

## 2018-03-05 DIAGNOSIS — I2510 Atherosclerotic heart disease of native coronary artery without angina pectoris: Secondary | ICD-10-CM

## 2018-03-05 DIAGNOSIS — Z9861 Coronary angioplasty status: Secondary | ICD-10-CM

## 2018-03-05 DIAGNOSIS — Z48812 Encounter for surgical aftercare following surgery on the circulatory system: Secondary | ICD-10-CM | POA: Diagnosis not present

## 2018-03-06 ENCOUNTER — Encounter: Payer: Self-pay | Admitting: Cardiovascular Disease

## 2018-03-06 ENCOUNTER — Ambulatory Visit (INDEPENDENT_AMBULATORY_CARE_PROVIDER_SITE_OTHER): Payer: Medicare Other | Admitting: Cardiovascular Disease

## 2018-03-06 VITALS — BP 139/83 | HR 55 | Ht 72.0 in | Wt 196.4 lb

## 2018-03-06 DIAGNOSIS — E7800 Pure hypercholesterolemia, unspecified: Secondary | ICD-10-CM | POA: Diagnosis not present

## 2018-03-06 DIAGNOSIS — Z9861 Coronary angioplasty status: Secondary | ICD-10-CM

## 2018-03-06 DIAGNOSIS — I10 Essential (primary) hypertension: Secondary | ICD-10-CM | POA: Diagnosis not present

## 2018-03-06 DIAGNOSIS — I2510 Atherosclerotic heart disease of native coronary artery without angina pectoris: Secondary | ICD-10-CM

## 2018-03-06 NOTE — Patient Instructions (Signed)
Medication Instructions:  Dr Croitoru recommends that you continue on your current medications as directed. Please refer to the Current Medication list given to you today.  If you need a refill on your cardiac medications before your next appointment, please call your pharmacy.   Follow-Up: At [facility], you and your health needs are our priority.  As part of our continuing mission to provide you with exceptional heart care, we have created designated Provider Care Teams.  These Care Teams include your primary Cardiologist (physician) and Advanced Practice Providers (APPs -  Physician Assistants and Nurse Practitioners) who all work together to provide you with the care you need, when you need it. You will need a follow up appointment in 9 months.  Please call our office 2 months in advance to schedule this appointment.  You may see Mihai Croitoru, MD or one of the following Advanced Practice Providers on your designated Care Team: Hao Meng, PA-C . Angela Duke, PA-C 

## 2018-03-06 NOTE — Progress Notes (Signed)
Cardiology Office Note:    Date:  03/07/2018   ID:  Kevin Wade, DOB November 09, 1950, MRN [id number]  PCP:  Yolanda Manges, DO  Cardiologist:  Thurmon Fair, MD  Electrophysiologist:  None   Referring MD: Yolanda Manges, DO   Chief Complaint  Patient presents with  . Follow-up    CAD    History of Present Illness:    Kevin Wade is a 67 y.o. male with a hx of recent disease presented with unstable angina and a slight increase in cardiac troponin on July 3, treated with placement of a drug-eluting stent for high-grade stenosis in the mid right coronary artery, also known to have a 40% stenosis in the left circumflex coronary artery.  He has preserved left ventricular systolic function.  Additional medical problems include hypercholesterolemia and hypertension.  He has done well since his angioplasty procedure, with developed recurrent chest discomfort.  He has occasional random episodes of shortness of breath at rest, suggestive of Brilinta related side effects.  He does not have exertional dyspnea.  He denies claudication, edema, dizziness, palpitations, syncope, focal neurological complaints, bleeding problems, falls or injuries.  He enjoys participation in cardiac rehab.  He is trying to improve his diet.  He has a lifelong problem with a weakness for 1 desserts and used to eat a slice of cake and a cup of ice cream every evening before his heart attack.  His wife is keeping him on track.  The patient specifically denies any chest pain at rest or with exertion, dyspnea with exertion, orthopnea, paroxysmal nocturnal dyspnea, syncope, palpitations, focal neurological deficits, intermittent claudication, lower extremity edema, unexplained weight gain, cough, hemoptysis or wheezing.   Past Medical History:  Diagnosis Date  . Arthritis   . Hyperlipidemia   . Hypertension     Past Surgical History:  Procedure Laterality Date  . APPENDECTOMY    . CORONARY STENT INTERVENTION N/A  11/13/2017   Procedure: CORONARY STENT INTERVENTION;  Surgeon: Tonny Bollman, MD;  Location: [facility] INVASIVE CV LAB;  Service: Cardiovascular;  Laterality: N/A;  . LEFT HEART CATH AND CORONARY ANGIOGRAPHY N/A 11/13/2017   Procedure: LEFT HEART CATH AND CORONARY ANGIOGRAPHY;  Surgeon: Tonny Bollman, MD;  Location: [facility] [place] INVASIVE CV LAB;  Service: Cardiovascular;  Laterality: N/A;    Current Medications: Current Meds  Medication Sig  . aspirin EC 81 MG tablet Take 81 mg by mouth daily.  Marland Kitchen atorvastatin (LIPITOR) 80 MG tablet Take 1 tablet (80 mg total) by mouth daily at 6 PM.  . famotidine (PEPCID) 20 MG tablet Take 20 mg by mouth 2 (two) times daily.  Marland Kitchen lisinopril (PRINIVIL,ZESTRIL) 20 MG tablet Take 20 mg by mouth.   . Misc Natural Products (OSTEO BI-FLEX JOINT SHIELD) TABS Take 1 tablet by mouth daily.  . Multiple Vitamins-Minerals (ONE-A-DAY MENS 50+ ADVANTAGE) TABS Take 1 tablet by mouth daily.  . nitroGLYCERIN (NITROSTAT) 0.4 MG SL tablet Place 1 tablet (0.4 mg total) under the tongue every 5 (five) minutes x 3 doses as needed for chest pain.  . Omega-3 1000 MG CAPS Take 1,000 mg by mouth daily.  . ticagrelor (BRILINTA) 90 MG TABS tablet Take 1 tablet (90 mg total) by mouth 2 (two) times daily.     Allergies:   Patient has no known allergies.   Social History   Socioeconomic History  . Marital status: Married    Spouse name: Not on file  . Number of children: Not on file  . Years of education: Not  on file  . Highest education level: Not on file  Occupational History  . Not on file  Social Needs  . Financial resource strain: Not on file  . Food insecurity:    Worry: Not on file    Inability: Not on file  . Transportation needs:    Medical: Not on file    Non-medical: Not on file  Tobacco Use  . Smoking status: Never Smoker  . Smokeless tobacco: Never Used  Substance and Sexual Activity  . Alcohol use: Yes    Comment: Socially   . Drug use: No  . Sexual activity: Not on file    Lifestyle  . Physical activity:    Days per week: Not on file    Minutes per session: Not on file  . Stress: Not on file  Relationships  . Social connections:    Talks on phone: Not on file    Gets together: Not on file    Attends religious service: Not on file    Active member of club or organization: Not on file    Attends meetings of clubs or organizations: Not on file    Relationship status: Not on file  Other Topics Concern  . Not on file  Social History Narrative  . Not on file     Family History: The patient's family history includes Hypertension in his father.  ROS:   Please see the history of present illness.     All other systems reviewed and are negative.  EKGs/Labs/Other Studies Reviewed:    The following studies were reviewed today: Notes from [facility] and cardiac rehab  EKG:  EKG is not ordered today.  The ekg ordered 11/22/2017 demonstrates sinus bradycardia, unchanged repolarization with ST segment elevation J-point "fishhook pattern" V2-V5, I, aVL  Recent Labs: 11/14/2017: BUN 14; Creatinine, Ser 1.22; Hemoglobin 12.9; Platelets 159; Potassium 4.1; Sodium 139  Recent Lipid Panel    Component Value Date/Time   CHOL 143 11/14/2017 0322   TRIG 82 11/14/2017 0322   HDL 47 11/14/2017 0322   CHOLHDL 3.0 11/14/2017 0322   VLDL 16 11/14/2017 0322   LDLCALC 80 11/14/2017 0322    Physical Exam:    VS:  BP 139/83   Pulse (!) 55   Ht 6' (1.829 m)   Wt 196 lb 6.4 oz (89.1 kg)   SpO2 99%   BMI 26.64 kg/m     Wt Readings from Last 3 Encounters:  03/06/18 196 lb 6.4 oz (89.1 kg)  01/09/18 190 lb 4.1 oz (86.3 kg)  12/10/17 186 lb (84.4 kg)     GEN: Fairly lean,  Well nourished, well developed in no acute distress HEENT: Normal NECK: No JVD; No carotid bruits LYMPHATICS: No lymphadenopathy CARDIAC: RRR, no murmurs, rubs, gallops RESPIRATORY:  Clear to auscultation without rales, wheezing or rhonchi  ABDOMEN: Soft, non-tender,  non-distended MUSCULOSKELETAL:  No edema; No deformity  SKIN: Warm and dry NEUROLOGIC:  Alert and oriented x 3 PSYCHIATRIC:  Normal affect   ASSESSMENT:    1. Coronary artery disease involving native coronary artery of native heart without angina pectoris   2. Hypercholesterolemia   3. Essential hypertension    PLAN:    In order of problems listed above:  1. CAD s/p DES-RCA: Although the Brilinta is causing some side effects.  Tolerable.  Plan to continue with DAPT through July 2020.  Critical not to interrupt it at least until January 2020.  Also reviewed the need for aggressive treatment of  risk factors.  He is an active but can improve his diet.   2. HLP: He should stay on high intensity statin expect first 12 months and reassess treatment for cholesterol levels. 3. HTN: His blood pressure was a little elevated when checked in but when I rechecked it was 127/78.  Not on beta-blockers due to bradycardia.   Medication Adjustments/Labs and Tests Ordered: Current medicines are reviewed at length with the patient today.  Concerns regarding medicines are outlined above.  No orders of the defined types were placed in this encounter.  No orders of the defined types were placed in this encounter.   Patient Instructions  Medication Instructions:  Dr Royann Shivers recommends that you continue on your current medications as directed. Please refer to the Current Medication list given to you today.  If you need a refill on your cardiac medications before your next appointment, please call your pharmacy.   Follow-Up: At [facility], you and your health needs are our priority.  As part of our continuing mission to provide you with exceptional heart care, we have created designated Provider Care Teams.  These Care Teams include your primary Cardiologist (physician) and Advanced Practice Providers (APPs -  Physician Assistants and Nurse Practitioners) who all work together to provide you with the  care you need, when you need it. You will need a follow up appointment in 9 months.  Please call our office 2 months in advance to schedule this appointment.  You may see Thurmon Fair, MD or one of the following Advanced Practice Providers on your designated Care Team: [place], [place] . Micah Flesher, PA-C    Signed, Thurmon Fair, MD  03/07/2018 11:15 AM    [facility]

## 2018-03-07 ENCOUNTER — Encounter (HOSPITAL_COMMUNITY)
Admission: RE | Admit: 2018-03-07 | Discharge: 2018-03-07 | Disposition: A | Discharge status: Home / Self Care | Payer: Medicare Other | Source: Ambulatory Visit | Attending: Cardiovascular Disease | Admitting: Cardiovascular Disease

## 2018-03-07 ENCOUNTER — Encounter: Payer: Self-pay | Admitting: Cardiovascular Disease

## 2018-03-07 ENCOUNTER — Telehealth: Payer: Self-pay | Admitting: Cardiology

## 2018-03-07 DIAGNOSIS — I214 Non-ST elevation (NSTEMI) myocardial infarction: Secondary | ICD-10-CM

## 2018-03-07 DIAGNOSIS — Z9861 Coronary angioplasty status: Secondary | ICD-10-CM

## 2018-03-07 DIAGNOSIS — I2510 Atherosclerotic heart disease of native coronary artery without angina pectoris: Secondary | ICD-10-CM | POA: Insufficient documentation

## 2018-03-07 DIAGNOSIS — I25119 Atherosclerotic heart disease of native coronary artery with unspecified angina pectoris: Secondary | ICD-10-CM | POA: Insufficient documentation

## 2018-03-07 DIAGNOSIS — Z48812 Encounter for surgical aftercare following surgery on the circulatory system: Secondary | ICD-10-CM | POA: Diagnosis not present

## 2018-03-07 NOTE — Progress Notes (Signed)
Entry blood pressure 144/80. Heart rate 61. Edan blood pressure today were as follows 162/78 heart rate 97 on Scifit                                                                                                                                                   178/82 heart rate 97 on treadmill 3/4/1.0 incline decreased to 0                                                                                                                                                   220/100 on the nustep level 6.0 met 5.9 exercise stopped                                                                                                                                                   162/84 recheck blood pressure                                                                       138/70 Harlan Stains NP called and notified and talked with the patient over the phone. No new orders received. Mendel Ryder said Mr Tamburrino can return to exercise on Monday and the office will contact him regarding a blood pressure check in the office. Will forward today's blood  pressure's to Dr Sallyanne Kuster for review.  Exit blood pressure 142/70. Heart rate 56.    Barnet Pall, RN,BSN 03/07/2018 2:38 PM

## 2018-03-07 NOTE — Telephone Encounter (Signed)
Called from cardiac rehab regarding patient's elevated blood pressure while exercising today. Reported initial blood pressure on arrival was in the 140s systolic but increased to 200s during exercise today. He was asymptomatic during this and tolerated exercise. HR was in the 90s. After exercise his blood pressure dropped back into the 160s, then 130s systolic. He reports no symptom at home. Have asked that he monitor his blood pressures at home with cuff and keep a log. Continue to come to cardiac rehab for exercise and reassess response to activity at that time. Discussed this with patient on the phone and CR RN.   Signed, Laverda Page, NP-C 03/07/2018, 2:21 PM

## 2018-03-08 NOTE — Progress Notes (Signed)
Please increase lisinopril to 40 mg daily. MCr

## 2018-03-10 ENCOUNTER — Telehealth: Payer: Self-pay | Admitting: Cardiovascular Disease

## 2018-03-10 ENCOUNTER — Telehealth (HOSPITAL_COMMUNITY): Payer: Self-pay

## 2018-03-10 ENCOUNTER — Encounter (HOSPITAL_COMMUNITY): Payer: Medicare Other

## 2018-03-10 MED ORDER — LISINOPRIL 40 MG PO TABS: 40.0000 mg | ORAL_TABLET | Freq: Every day | ORAL | 3 refills | Status: DC

## 2018-03-10 NOTE — Telephone Encounter (Signed)
New message:     Pt c/o BP issue: STAT if pt c/o blurred vision, one-sided weakness or slurred speech  1. What are your last 5 BP readings? 140/82, 200/100 while at rehab on Friday.  2. Are you having any other symptoms (ex. Dizziness, headache, blurred vision, passed out)? No  3. What is your BP issue? Pt's wife is wanting to know who should they be consulting when it comes to the pt's BP.

## 2018-03-10 NOTE — Telephone Encounter (Signed)
lmtcb

## 2018-03-10 NOTE — Telephone Encounter (Signed)
MD note concerning med increase documented on cardiac rehab note.  Thurmon Fair, MD at 03/07/2018 11:59 PM   Status: Signed    Please increase lisinopril to 40 mg daily. MCr

## 2018-03-10 NOTE — Telephone Encounter (Signed)
Patient/wife notified of med change per MD. Rx(s) sent to pharmacy electronically.

## 2018-03-10 NOTE — Telephone Encounter (Signed)
Please see previous note. A recommended increase lisinopril to 40 mg daily.

## 2018-03-10 NOTE — Telephone Encounter (Signed)
BP issues were addressed per Laverda Page, NP on 10/25 - track BP at home, log, continue cardiac rehab. This was reiterated to wife.   Patient checks home BP 1-2 times daily   This AM 144/81 134/78 149/90 155/88 124/86 146/77  Patient/wife are concerned about elevated BP readings and would like recommendations going forward. Message routed to MD/CVRR to review & advise

## 2018-03-10 NOTE — Telephone Encounter (Signed)
New Message         Kevin Wade, Kevin Wade at [facility] would like for you to give her a call back concerning the patient medication

## 2018-03-11 NOTE — Telephone Encounter (Signed)
Pt c/o BP issue: STAT if pt c/o blurred vision, one-sided weakness or slurred speech  1. What are your last 5 BP readings?  142/79 171/96 153/84  2. Are you having any other symptoms (ex. Dizziness, headache, blurred vision, passed out)? Lightheaded.   3. What is your BP issue? Patient just increased his BP medication, lisinopril yesterday. Wife wants to know how long medication takes to work. She is concerned since his BP is still high.

## 2018-03-11 NOTE — Telephone Encounter (Signed)
Called patients wife, who states that they increased lisinopril to 40 mg yesterday after he went to cardiac rehab. Patient was advised it does take time for the medication increase to take effect. I advised to continue to check BP daily, and give it a few days, call us at the end of the week if it does not go down any or if lightheadness gets worse. I advised to drink more fluids, and watch any salty foods, patients wife asked if he should stop cardiac rehab, I advised that if Dr.Croitoru wanted him to stop he would have advised him yesterday to do so. I did advised them to notify cardiac rehab and if they have any issues with BP again to call us.   Patient and patients wife verbalized understanding.

## 2018-03-12 ENCOUNTER — Encounter (HOSPITAL_COMMUNITY): Payer: Medicare Other

## 2018-03-13 ENCOUNTER — Telehealth: Payer: Self-pay | Admitting: Cardiovascular Disease

## 2018-03-13 NOTE — Telephone Encounter (Signed)
Spoke with pt wife. The pt has been experiencing a lot of constipation. he gets plenty of fiber in his diet and drinks plenty of water. They would like to know what he can take OTC to get him some relief. I recommending a stool softner like docusate.adv her that I will fwd a message to our Pharm-D to see if tere is something else that can be recommended.

## 2018-03-13 NOTE — Progress Notes (Signed)
Cardiac Individual Treatment Plan  Patient Details  Name: Kevin Wade MRN: [id number] Date of Birth: 1951/03/01 Referring Provider:   Flowsheet Row CARDIAC REHAB PHASE II ORIENTATION from 01/09/2018 in [place]  Referring Provider  Croitoru, Mihai MD       Initial Encounter Date:  [place] from 01/09/2018 in [place]  Date  01/09/18      Visit Diagnosis: Non-ST elevation (NSTEMI) myocardial infarction [facility])  CAD S/P percutaneous coronary angioplasty  Patient's Home Medications on Admission:  Current Outpatient Medications:  .  aspirin EC 81 MG tablet, Take 81 mg by mouth daily., Disp: , Rfl:  .  atorvastatin (LIPITOR) 80 MG tablet, Take 1 tablet (80 mg total) by mouth daily at 6 PM., Disp: 30 tablet, Rfl: 0 .  famotidine (PEPCID) 20 MG tablet, Take 20 mg by mouth 2 (two) times daily., Disp: , Rfl:  .  Misc Natural Products (OSTEO BI-FLEX JOINT SHIELD) TABS, Take 1 tablet by mouth daily., Disp: , Rfl:  .  Multiple Vitamins-Minerals (ONE-A-DAY MENS 50+ ADVANTAGE) TABS, Take 1 tablet by mouth daily., Disp: , Rfl:  .  nitroGLYCERIN (NITROSTAT) 0.4 MG SL tablet, Place 1 tablet (0.4 mg total) under the tongue every 5 (five) minutes x 3 doses as needed for chest pain., Disp: 20 tablet, Rfl: 0 .  Omega-3 1000 MG CAPS, Take 1,000 mg by mouth daily., Disp: , Rfl:  .  ticagrelor (BRILINTA) 90 MG TABS tablet, Take 1 tablet (90 mg total) by mouth 2 (two) times daily., Disp: 180 tablet, Rfl: 1 .  lisinopril (PRINIVIL,ZESTRIL) 40 MG tablet, Take 1 tablet (40 mg total) by mouth daily., Disp: 90 tablet, Rfl: 3  Past Medical History: Past Medical History:  Diagnosis Date  . Arthritis   . Hyperlipidemia   . Hypertension     Tobacco Use: Social History   Tobacco Use  Smoking Status Never Smoker  Smokeless Tobacco Never Used    Labs: Recent Review Scientist, physiological    Labs for ITP  Cardiac and Pulmonary Rehab Latest Ref Rng & Units 11/14/2017   Cholestrol 0 - 200 mg/dL 143   LDLCALC 0 - 99 mg/dL 80   HDL >40 mg/dL 47   Trlycerides <150 mg/dL 82      Capillary Blood Glucose: No results found for: GLUCAP   Exercise Target Goals: Exercise Program Goal: Individual exercise prescription set using results from initial 6 min walk test and THRR while considering  patient's activity barriers and safety.   Exercise Prescription Goal: Initial exercise prescription builds to 30-45 minutes a day of aerobic activity, 2-3 days per week.  Home exercise guidelines will be given to patient during program as part of exercise prescription that the participant will acknowledge.  Activity Barriers & Risk Stratification: Activity Barriers & Cardiac Risk Stratification - 01/09/18 1144    Activity Barriers & Cardiac Risk Stratification          Activity Barriers  Joint Problems;Other (comment)    Comments  Left Shoulder Chronic Pain, Left Bicep Tear    Cardiac Risk Stratification  Moderate           6 Minute Walk: 6 Minute Walk    6 Minute Walk    Row Name 01/09/18 1143   Phase  Initial   Distance  1584 feet   Walk Time  6 minutes   # of Rest Breaks  0   MPH  3  METS  2.99   RPE  12   Perceived Dyspnea   0   VO2 Peak  10.5   Symptoms  No   Resting HR  50 bpm   Resting BP  120/62   Resting Oxygen Saturation   98 %   Exercise Oxygen Saturation  during 6 min walk  99 %   Max Ex. HR  88 bpm   Max Ex. BP  128/80   2 Minute Post BP  116/70          Oxygen Initial Assessment:   Oxygen Re-Evaluation:   Oxygen Discharge (Final Oxygen Re-Evaluation):   Initial Exercise Prescription: Initial Exercise Prescription - 01/09/18 1100    Date of Initial Exercise RX and Referring Provider          Date  01/09/18    Referring Provider  Sanda Klein MD     Expected Discharge Date  04/04/18        Treadmill          MPH  2.4    Grade  0    Minutes  10     METs  2.84        Recumbant Bike          Level  2    Watts  30    Minutes  10    METs  3.11        NuStep          Level  2    SPM  80    Minutes  10    METs  2.7        Prescription Details          Frequency (times per week)  3x    Duration  Progress to 30 minutes of continuous aerobic without signs/symptoms of physical distress        Intensity          THRR 40-80% of Max Heartrate  61-122    Ratings of Perceived Exertion  11-13    Perceived Dyspnea  0-4        Progression          Progression  Continue progressive overload as per policy without signs/symptoms or physical distress.        Resistance Training          Training Prescription  Yes    Weight  3lbs    Reps  10-15           Perform Capillary Blood Glucose checks as needed.  Exercise Prescription Changes: Exercise Prescription Changes    Response to Exercise    Row Name 01/29/18 1328 02/10/18 1328 03/07/18 1315   Blood Pressure (Admit)  110/66  142/82  144/80   Blood Pressure (Exercise)  140/80  164/72  220/100   Blood Pressure (Exit)  118/68  122/80  138/70   Heart Rate (Admit)  53 bpm  52 bpm  61 bpm   Heart Rate (Exercise)  104 bpm  98 bpm  107 bpm   Heart Rate (Exit)  51 bpm  50 bpm  56 bpm   Rating of Perceived Exertion (Exercise)  _0 Symptoms  none  none  none   Comments  no documentation  no documentation  BP was too high. Exercise stopped due to this.    Duration  Progress to 30 minutes of  aerobic without signs/symptoms of physical distress  Progress to 30 minutes of  aerobic without signs/symptoms of physical distress  Progress to 30 minutes of  aerobic without signs/symptoms of physical distress   Intensity  THRR unchanged  THRR unchanged  THRR unchanged       Progression    Row Name 01/29/18 1328 02/10/18 1328 03/07/18 1315   Progression  Continue to progress workloads to maintain intensity without signs/symptoms of physical distress.  Continue to progress  workloads to maintain intensity without signs/symptoms of physical distress.  Continue to progress workloads to maintain intensity without signs/symptoms of physical distress.   Average METs  3.6  4.1  4.6       Resistance Training    Row Name 01/29/18 1328 02/10/18 1328 03/07/18 1315   Training Prescription  No Relaxation day, no weights.  Yes  Yes   Weight  no documentation  4lbs  4lbs   Reps  no documentation  10-15  10-15   Time  no documentation  10 Minutes  10 Minutes       Interval Training    Row Name 01/29/18 1328 02/10/18 1328 03/07/18 1315   Interval Training  No  No  No       Treadmill    Row Name 01/29/18 1328 02/10/18 1328 03/07/18 1315   MPH  2.6  3  3.4   Grade  _0 Minutes  _1 METs  3.35  3.71  4.07       Recumbant Bike    Row Name 01/29/18 1328 02/10/18 1328 03/07/18 1315   Level  4  4 Upright SciFit Bike  4 Upright SciFit Bike   Watts  50  no documentation  no documentation   Minutes  _2 METs  3.85  no documentation  4.7       NuStep    Row Name 01/29/18 1328 02/10/18 1328 03/07/18 1315   Level  _3 SPM  80  85  85   Minutes  _4 METs  3.5  4.4  4.9       Home Exercise Plan    Row Name 01/29/18 1328 02/10/18 1328 03/07/18 1315   Plans to continue exercise at  Home (comment)  Home (comment)  Home (comment)   Frequency  Add 3 additional days to program exercise sessions.  Add 3 additional days to program exercise sessions.  Add 3 additional days to program exercise sessions.   Initial Home Exercises Provided  01/29/18  01/29/18  01/29/18          Exercise Comments: Exercise Comments    Row Name 01/29/18 1353 02/10/18 1351 03/13/18 1003   Exercise Comments  Reviewed home exericse guidelines, METs, and goals with patient.  Reviewed METs and goals with patient.  Pt increases MET level. Will continue to monitor BP. Continues to exercise at home.       Exercise Goals and Review: Exercise Goals    Exercise  Goals    Row Name 01/09/18 1145   Increase Physical Activity  Yes   Intervention  Develop an individualized exercise prescription for aerobic and resistive training based on initial evaluation findings, risk stratification, comorbidities and participant's personal goals.;Provide advice, education, support and counseling about physical activity/exercise needs.   Expected Outcomes  Short Term: Attend rehab on a regular basis to increase amount of physical activity.;Long Term: Add in home exercise to make exercise  part of routine and to increase amount of physical activity.;Long Term: Exercising regularly at least 3-5 days a week.   Increase Strength and Stamina  Yes   Intervention  Provide advice, education, support and counseling about physical activity/exercise needs.;Develop an individualized exercise prescription for aerobic and resistive training based on initial evaluation findings, risk stratification, comorbidities and participant's personal goals.   Expected Outcomes  Short Term: Increase workloads from initial exercise prescription for resistance, speed, and METs.;Short Term: Perform resistance training exercises routinely during rehab and add in resistance training at home;Long Term: Improve cardiorespiratory fitness, muscular endurance and strength as measured by increased METs and functional capacity (6MWT)   Able to understand and use rate of perceived exertion (RPE) scale  Yes   Intervention  Provide education and explanation on how to use RPE scale   Expected Outcomes  Short Term: Able to use RPE daily in rehab to express subjective intensity level;Long Term:  Able to use RPE to guide intensity level when exercising independently   Knowledge and understanding of Target Heart Rate Range (THRR)  Yes   Intervention  Provide education and explanation of THRR including how the numbers were predicted and where they are located for reference   Expected Outcomes  Short Term: Able to state/look up  THRR;Long Term: Able to use THRR to govern intensity when exercising independently;Short Term: Able to use daily as guideline for intensity in rehab   Understanding of Exercise Prescription  Yes   Intervention  Provide education, explanation, and written materials on patient's individual exercise prescription   Expected Outcomes  Short Term: Able to explain program exercise prescription;Long Term: Able to explain home exercise prescription to exercise independently          Exercise Goals Re-Evaluation : Exercise Goals Re-Evaluation    Exercise Goal Re-Evaluation    Row Name 01/29/18 1353 02/10/18 1351 03/13/18 1001   Exercise Goals Review  Understanding of Exercise Prescription;Knowledge and understanding of Target Heart Rate Range (THRR);Able to understand and use rate of perceived exertion (RPE) scale  Understanding of Exercise Prescription;Knowledge and understanding of Target Heart Rate Range (THRR);Able to understand and use rate of perceived exertion (RPE) scale;Increase Strength and Stamina;Increase Physical Activity  Increase Physical Activity;Increase Strength and Stamina;Able to understand and use rate of perceived exertion (RPE) scale;Knowledge and understanding of Target Heart Rate Range (THRR);Understanding of Exercise Prescription;Able to check pulse independently   Comments  Reviewed home exercise guidelines with patient including THRR, RPE scale and endpoints for exercise. Pt is walking 15-20 minutes, 3 days/week as his mode of home exercise. Encouraged pt to increase duration to 30 minutes, and pt is amenable to this.  Patient is walking 15 minutes and using home exercise equipment 15 minutes on Tuesday, Thursday, and Saturday. Pt is also dancing as a mode of exercise.   Pt BP has been high with exercise. With rest BP becomes normal. Doctor was contacted.    Expected Outcomes  Patient will increase walking duration at home to 30 minutes to help improve cardiorespiratory fitness.   Patient will increase workloads to help achieve personal health and fitness goals.  Will continue to monitor Pt and BP. Continue exercise prescription.           Discharge Exercise Prescription (Final Exercise Prescription Changes): Exercise Prescription Changes - 03/07/18 1315    Response to Exercise          Blood Pressure (Admit)  144/80    Blood Pressure (Exercise)  220/100  Blood Pressure (Exit)  138/70    Heart Rate (Admit)  61 bpm    Heart Rate (Exercise)  107 bpm    Heart Rate (Exit)  56 bpm    Rating of Perceived Exertion (Exercise)  13    Symptoms  none    Comments  BP was too high. Exercise stopped due to this.     Duration  Progress to 30 minutes of  aerobic without signs/symptoms of physical distress    Intensity  THRR unchanged        Progression          Progression  Continue to progress workloads to maintain intensity without signs/symptoms of physical distress.    Average METs  4.6        Resistance Training          Training Prescription  Yes    Weight  4lbs    Reps  10-15    Time  10 Minutes        Interval Training          Interval Training  No        Treadmill          MPH  3.4    Grade  1    Minutes  10    METs  4.07        Recumbant Bike          Level  4   Upright SciFit Bike   Minutes  10    METs  4.7        NuStep          Level  4    SPM  85    Minutes  10    METs  4.9        Home Exercise Plan          Plans to continue exercise at  Home (comment)    Frequency  Add 3 additional days to program exercise sessions.    Initial Home Exercises Provided  01/29/18           Nutrition:  Target Goals: Understanding of nutrition guidelines, daily intake of sodium <1551m, cholesterol <2069m calories 30% from fat and 7% or less from saturated fats, daily to have 5 or more servings of fruits and vegetables.  Biometrics: Pre Biometrics - 01/09/18 1144    Pre Biometrics          Height  5' 11.25" (1.81 m)    Weight   86.3 kg    Waist Circumference  37.5 inches    Hip Circumference  39 inches    Waist to Hip Ratio  0.96 %    BMI (Calculated)  26.34    Triceps Skinfold  13 mm    % Body Fat  25 %    Grip Strength  38 kg    Flexibility  8 in    Single Leg Stand  30 seconds            Nutrition Therapy Plan and Nutrition Goals: Nutrition Therapy & Goals - 01/09/18 1150    Nutrition Therapy          Diet  heart healthy        Personal Nutrition Goals          Nutrition Goal  Pt to identify and limit food sources of saturated fat, trans fat, refined carbohydrates and sodium        Intervention Plan  Intervention  Prescribe, educate and counsel regarding individualized specific dietary modifications aiming towards targeted core components such as weight, hypertension, lipid management, diabetes, heart failure and other comorbidities.    Expected Outcomes  Short Term Goal: Understand basic principles of dietary content, such as calories, fat, sodium, cholesterol and nutrients.           Nutrition Assessments: Nutrition Assessments - 01/09/18 1151    MEDFICTS Scores          Pre Score  23           Nutrition Goals Re-Evaluation:   Nutrition Goals Re-Evaluation:   Nutrition Goals Discharge (Final Nutrition Goals Re-Evaluation):   Psychosocial: Target Goals: Acknowledge presence or absence of significant depression and/or stress, maximize coping skills, provide positive support system. Participant is able to verbalize types and ability to use techniques and skills needed for reducing stress and depression.  Initial Review & Psychosocial Screening: Initial Psych Review & Screening - 01/09/18 1141    Initial Review          Current issues with  None Identified        Family Dynamics          Good Support System?  Yes   Kevin Wade reports his wife as a source of support.       Barriers          Psychosocial barriers to participate in program  There are no  identifiable barriers or psychosocial needs.        Screening Interventions          Interventions  Encouraged to exercise           Quality of Life Scores: Quality of Life - 01/09/18 1142    Quality of Life          Select  Quality of Life        Quality of Life Scores          Health/Function Pre  28.2 %    Socioeconomic Pre  27.08 %    Psych/Spiritual Pre  30 %    Family Pre  28.3 %    GLOBAL Pre  28.39 %          Scores of 19 and below usually indicate a poorer quality of life in these areas.  A difference of  2-3 points is a clinically meaningful difference.  A difference of 2-3 points in the total score of the Quality of Life Index has been associated with significant improvement in overall quality of life, self-image, physical symptoms, and general health in studies assessing change in quality of life.  PHQ-9: Recent Review Flowsheet Data    Depression screen [facility] 2/9 01/20/2018 01/04/2015   Decreased Interest 0 0   Down, Depressed, Hopeless 0 0   PHQ - 2 Score 0 0     Interpretation of Total Score  Total Score Depression Severity:  1-4 = Minimal depression, 5-9 = Mild depression, 10-14 = Moderate depression, 15-19 = Moderately severe depression, 20-27 = Severe depression   Psychosocial Evaluation and Intervention: Psychosocial Evaluation - 01/20/18 1423    Psychosocial Evaluation & Interventions          Interventions  Encouraged to exercise with the program and follow exercise prescription    Comments  no psychsocial needs identified, no interventions necessary. pt enjoys swimming, playing pool and cards at home.     Expected Outcomes  pt will exhibit positive outlook with good coping skills.  Continue Psychosocial Services   No Follow up required           Psychosocial Re-Evaluation: Psychosocial Re-Evaluation    Psychosocial Re-Evaluation    [place] Name 02/13/18 1105 03/13/18 1447   Current issues with  None Identified  None Identified   Comments   no psychosocial needs identified, no interventions necessary   no psychosocial needs identified, no interventions necessary    Expected Outcomes  pt will exhibit positive outlook with good coping skills.   pt will exhibit positive outlook with good coping skills.    Interventions  Encouraged to attend Cardiac Rehabilitation for the exercise  Encouraged to attend Cardiac Rehabilitation for the exercise   Continue Psychosocial Services   No Follow up required  No Follow up required          Psychosocial Discharge (Final Psychosocial Re-Evaluation): Psychosocial Re-Evaluation - 03/13/18 1447    Psychosocial Re-Evaluation          Current issues with  None Identified    Comments  no psychosocial needs identified, no interventions necessary     Expected Outcomes  pt will exhibit positive outlook with good coping skills.     Interventions  Encouraged to attend Cardiac Rehabilitation for the exercise    Continue Psychosocial Services   No Follow up required           Vocational Rehabilitation: Provide vocational rehab assistance to qualifying candidates.   Vocational Rehab Evaluation & Intervention: Vocational Rehab - 01/09/18 1142    Initial Vocational Rehab Evaluation & Intervention          Assessment shows need for Vocational Rehabilitation  No           Education: Education Goals: Education classes will be provided on a weekly basis, covering required topics. Participant will state understanding/return demonstration of topics presented.  Learning Barriers/Preferences: Learning Barriers/Preferences - 01/09/18 1153    Learning Barriers/Preferences          Learning Barriers  Sight    Learning Preferences  Written Material;Video;Skilled Demonstration           Education Topics: Count Your Pulse:  -Group instruction provided by verbal instruction, demonstration, patient participation and written materials to support subject.  Instructors address importance of being able  to find your pulse and how to count your pulse when at home without a heart monitor.  Patients get hands on experience counting their pulse with staff help and individually. Flowsheet Row CARDIAC REHAB PHASE II EXERCISE from 03/07/2018 in [place]  Date  03/07/18  Instruction Review Code  2- Demonstrated Understanding      Heart Attack, Angina, and Risk Factor Modification:  -Group instruction provided by verbal instruction, video, and written materials to support subject.  Instructors address signs and symptoms of angina and heart attacks.    Also discuss risk factors for heart disease and how to make changes to improve heart health risk factors. Flowsheet Row CARDIAC REHAB PHASE II EXERCISE from 03/07/2018 in [place]  Date  02/12/18  Instruction Review Code  2- Demonstrated Understanding      Functional Fitness:  -Group instruction provided by verbal instruction, demonstration, patient participation, and written materials to support subject.  Instructors address safety measures for doing things around the house.  Discuss how to get up and down off the floor, how to pick things up properly, how to safely get out of a chair without assistance, and balance training.  Meditation and Mindfulness:  -Group instruction provided by verbal instruction, patient participation, and written materials to support subject.  Instructor addresses importance of mindfulness and meditation practice to help reduce stress and improve awareness.  Instructor also leads participants through a meditation exercise.  Flowsheet Row CARDIAC REHAB PHASE II EXERCISE from 03/07/2018 in [place]  Date  03/05/18  Educator  RN  Instruction Review Code  2- Demonstrated Understanding      Stretching for Flexibility and Mobility:  -Group instruction provided by verbal instruction, patient participation, and written  materials to support subject.  Instructors lead participants through series of stretches that are designed to increase flexibility thus improving mobility.  These stretches are additional exercise for major muscle groups that are typically performed during regular warm up and cool down.   Hands Only CPR:  -Group verbal, video, and participation provides a basic overview of AHA guidelines for community CPR. Role-play of emergencies allow participants the opportunity to practice calling for help and chest compression technique with discussion of AED use.   Hypertension: -Group verbal and written instruction that provides a basic overview of hypertension including the most recent diagnostic guidelines, risk factor reduction with self-care instructions and medication management.    Nutrition I class: Heart Healthy Eating:  -Group instruction provided by PowerPoint slides, verbal discussion, and written materials to support subject matter. The instructor gives an explanation and review of the Therapeutic Lifestyle Changes diet recommendations, which includes a discussion on lipid goals, dietary fat, sodium, fiber, plant stanol/sterol esters, sugar, and the components of a well-balanced, healthy diet.   Nutrition II class: Lifestyle Skills:  -Group instruction provided by PowerPoint slides, verbal discussion, and written materials to support subject matter. The instructor gives an explanation and review of label reading, grocery shopping for heart health, heart healthy recipe modifications, and ways to make healthier choices when eating out.   Diabetes Question & Answer:  -Group instruction provided by PowerPoint slides, verbal discussion, and written materials to support subject matter. The instructor gives an explanation and review of diabetes co-morbidities, pre- and post-prandial blood glucose goals, pre-exercise blood glucose goals, signs, symptoms, and treatment of hypoglycemia and  hyperglycemia, and foot care basics.   Diabetes Blitz:  -Group instruction provided by PowerPoint slides, verbal discussion, and written materials to support subject matter. The instructor gives an explanation and review of the physiology behind type 1 and type 2 diabetes, diabetes medications and rational behind using different medications, pre- and post-prandial blood glucose recommendations and Hemoglobin A1c goals, diabetes diet, and exercise including blood glucose guidelines for exercising safely.    Portion Distortion:  -Group instruction provided by PowerPoint slides, verbal discussion, written materials, and food models to support subject matter. The instructor gives an explanation of serving size versus portion size, changes in portions sizes over the last 20 years, and what consists of a serving from each food group.   Stress Management:  -Group instruction provided by verbal instruction, video, and written materials to support subject matter.  Instructors review role of stress in heart disease and how to cope with stress positively.   Flowsheet Row CARDIAC REHAB PHASE II EXERCISE from 03/07/2018 in [place]  Date  01/29/18  Instruction Review Code  2- Demonstrated Understanding      Exercising on Your Own:  -Group instruction provided by verbal instruction, power point, and written materials to support subject.  Instructors discuss benefits of exercise, components of exercise, frequency and intensity  of exercise, and end points for exercise.  Also discuss use of nitroglycerin and activating EMS.  Review options of places to exercise outside of rehab.  Review guidelines for sex with heart disease.   Cardiac Drugs I:  -Group instruction provided by verbal instruction and written materials to support subject.  Instructor reviews cardiac drug classes: antiplatelets, anticoagulants, beta blockers, and statins.  Instructor discusses reasons, side  effects, and lifestyle considerations for each drug class.   Cardiac Drugs II:  -Group instruction provided by verbal instruction and written materials to support subject.  Instructor reviews cardiac drug classes: angiotensin converting enzyme inhibitors (ACE-I), angiotensin II receptor blockers (ARBs), nitrates, and calcium channel blockers.  Instructor discusses reasons, side effects, and lifestyle considerations for each drug class. Flowsheet Row CARDIAC REHAB PHASE II EXERCISE from 03/07/2018 in [place]  Date  02/19/18  Educator  Pharmacist  Instruction Review Code  2- Demonstrated Understanding      Anatomy and Physiology of the Circulatory System:  Group verbal and written instruction and models provide basic cardiac anatomy and physiology, with the coronary electrical and arterial systems. Review of: AMI, Angina, Valve disease, Heart Failure, Peripheral Artery Disease, Cardiac Arrhythmia, Pacemakers, and the ICD. Flowsheet Row CARDIAC REHAB PHASE II EXERCISE from 03/07/2018 in [place]  Date  02/05/18  Instruction Review Code  2- Demonstrated Understanding      Other Education:  -Group or individual verbal, written, or video instructions that support the educational goals of the cardiac rehab program.   Holiday Eating Survival Tips:  -Group instruction provided by PowerPoint slides, verbal discussion, and written materials to support subject matter. The instructor gives patients tips, tricks, and techniques to help them not only survive but enjoy the holidays despite the onslaught of food that accompanies the holidays.   Knowledge Questionnaire Score: Knowledge Questionnaire Score - 01/09/18 1142    Knowledge Questionnaire Score          Pre Score  --   Missing questions needs to complete          Core Components/Risk Factors/Patient Goals at Admission: Personal Goals and Risk Factors at Admission -  01/09/18 1331    Core Components/Risk Factors/Patient Goals on Admission           Weight Management  Yes;Weight Loss    Intervention  Weight Management: Develop a combined nutrition and exercise program designed to reach desired caloric intake, while maintaining appropriate intake of nutrient and fiber, sodium and fats, and appropriate energy expenditure required for the weight goal.;Weight Management: Provide education and appropriate resources to help participant work on and attain dietary goals.;Weight Management/Obesity: Establish reasonable short term and long term weight goals.;Obesity: Provide education and appropriate resources to help participant work on and attain dietary goals.    Admit Weight  211 lb 6.7 oz (95.9 kg)    Goal Weight: Long Term  200 lb (90.7 kg)    Expected Outcomes  Long Term: Adherence to nutrition and physical activity/exercise program aimed toward attainment of established weight goal;Short Term: Continue to assess and modify interventions until short term weight is achieved;Weight Loss: Understanding of general recommendations for a balanced deficit meal plan, which promotes 1-2 lb weight loss per week and includes a negative energy balance of [phone] kcal/d;Understanding recommendations for meals to include 15-35% energy as protein, 25-35% energy from fat, 35-60% energy from carbohydrates, less than 251m of dietary cholesterol, 20-35 gm of total fiber daily;Understanding of distribution of  calorie intake throughout the day with the consumption of 4-5 meals/snacks    Hypertension  Yes    Intervention  Provide education on lifestyle modifcations including regular physical activity/exercise, weight management, moderate sodium restriction and increased consumption of fresh fruit, vegetables, and low fat dairy, alcohol moderation, and smoking cessation.;Monitor prescription use compliance.    Expected Outcomes  Short Term: Continued assessment and intervention until BP is <  140/51m HG in hypertensive participants. < 130/852mHG in hypertensive participants with diabetes, heart failure or chronic kidney disease.;Long Term: Maintenance of blood pressure at goal levels.    Lipids  Yes    Intervention  Provide education and support for participant on nutrition & aerobic/resistive exercise along with prescribed medications to achieve LDL <7039mHDL >8m51m  Expected Outcomes  Short Term: Participant states understanding of desired cholesterol values and is compliant with medications prescribed. Participant is following exercise prescription and nutrition guidelines.;Long Term: Cholesterol controlled with medications as prescribed, with individualized exercise RX and with personalized nutrition plan. Value goals: LDL < 70mg44mL > 40 mg.           Core Components/Risk Factors/Patient Goals Review:  Goals and Risk Factor Review    Core Components/Risk Factors/Patient Goals Review    Row Name 01/20/18 1426 02/13/18 1138 03/13/18 1447   Personal Goals Review  Weight Management/Obesity;Hypertension;Lipids;Other  Weight Management/Obesity;Hypertension;Lipids;Other  Weight Management/Obesity;Hypertension;Lipids;Other   Review  pt with multiple CAD RF demonstrates eagerness to participate in CR program. pt personal goals are to complete program learning heart healthy lifestyle and develop home exercise routine.   pt with multiple CAD RF demonstrates eagerness to participate in CR program. pt personal goals are to complete program learning heart healthy lifestyle and develop home exercise routine. pt notes walking at home is easier for him. pt personal goal is to play basketball with his grandchildren.   pt with multiple CAD RF demonstrates eagerness to participate in CR program. pt personal goals are to complete program learning heart healthy lifestyle and develop home exercise routine. pt notes walking at home is easier for him. pt personal goal is to play basketball with his  grandchildren.  pt enjoyed recent cruise however now exhibits exertional hypertension. medication adjustments per cardiology recommendations.     Expected Outcomes  pt will participate  in CR exercise, nutrition and lifestyle modification opportunites to decrease overall RF.   pt will participate  in CR exercise, nutrition and lifestyle modification opportunites to decrease overall RF.   pt will participate  in CR exercise, nutrition and lifestyle modification opportunites to decrease overall RF.           Core Components/Risk Factors/Patient Goals at Discharge (Final Review):  Goals and Risk Factor Review - 03/13/18 1447    Core Components/Risk Factors/Patient Goals Review          Personal Goals Review  Weight Management/Obesity;Hypertension;Lipids;Other    Review  pt with multiple CAD RF demonstrates eagerness to participate in CR program. pt personal goals are to complete program learning heart healthy lifestyle and develop home exercise routine. pt notes walking at home is easier for him. pt personal goal is to play basketball with his grandchildren.  pt enjoyed recent cruise however now exhibits exertional hypertension. medication adjustments per cardiology recommendations.      Expected Outcomes  pt will participate  in CR exercise, nutrition and lifestyle modification opportunites to decrease overall RF.            ITP Comments: ITP Comments  [place] Name 01/09/18 1137 01/17/18 1548 01/20/18 1422 02/13/18 1104 03/13/18 1446   ITP Comments  Dr. Fransico Him, Medical Director  30 day ITP review.    pt started group exercise. pt tolerated light activity without difficulty. pt oriented to exercise equipment and safety routine.   30 day ITP review.   30 day ITP review. pt with good participation, recent absence from previously scheduled vacation and hypertensive episode.       Comments:

## 2018-03-13 NOTE — Telephone Encounter (Signed)
New Message:   Patient wife calling having some concerns about husband having some side effects from some medication.

## 2018-03-14 ENCOUNTER — Telehealth (HOSPITAL_COMMUNITY): Payer: Self-pay | Admitting: Cardiac Rehabilitation

## 2018-03-14 ENCOUNTER — Encounter (HOSPITAL_COMMUNITY): Payer: Medicare Other

## 2018-03-14 NOTE — Telephone Encounter (Signed)
pc to pt to assess reason for absence from cardiac rehab. Pt c/o dizziness with increased lisinopril dosage. Pt reports this symptom has resolved.  He has been exercising at home on stationary bicycle. Pt has been taking his BP at home and plans to return to CR next week.  Deveron Furlong, RN, BSN Cardiac Pulmonary Rehab

## 2018-03-14 NOTE — Telephone Encounter (Signed)
Stool softener is good, patient also might try senna for a day or two.

## 2018-03-17 ENCOUNTER — Encounter (HOSPITAL_COMMUNITY)
Admission: RE | Admit: 2018-03-17 | Discharge: 2018-03-17 | Disposition: A | Discharge status: Home / Self Care | Payer: Medicare Other | Source: Ambulatory Visit | Attending: Cardiovascular Disease | Admitting: Cardiovascular Disease

## 2018-03-17 DIAGNOSIS — Z955 Presence of coronary angioplasty implant and graft: Secondary | ICD-10-CM | POA: Insufficient documentation

## 2018-03-17 DIAGNOSIS — Z48812 Encounter for surgical aftercare following surgery on the circulatory system: Secondary | ICD-10-CM | POA: Diagnosis not present

## 2018-03-17 DIAGNOSIS — I214 Non-ST elevation (NSTEMI) myocardial infarction: Secondary | ICD-10-CM

## 2018-03-17 NOTE — Telephone Encounter (Signed)
Spoke with pt and his wife. Adv them of the Pharm-D recommendation. Pt reports taking docusate prn and he has had improvement with constipation.  Pt sts that he was on hold trying to call our office. Pt reports feeling dizzy and light headed with positional changes, pt sts that it normal occurs during standing but resolves once he sits. Yesterday he was having dizziness with sitting. He has not been attended cardiac rehab because of this. Pt denies any other symptoms. He has been taking Tylenol prn for a torn rotator cuff. Adv pt that Tylenol will not affect his BP he is to avoid NSAIDs, and he should follow the daily dosage requirement for Tylenol. Pt sts that his Lisinopril was increased to 40mg  daily on 03/06/18 by Dr.Croitoru. Pt checks his BP 3-4 times daily randomly and does not keep a BP log. He normally take his Lisinopril around 8am. Pt BP readings 142/79, 159/87, 170/92. Pt not able to provide date and times, the readings he repots have been consistently elevated with no systolic BP <140. Adv pt to try taking the Lisinopril at bedtime, adv him to start a BP and HR log and record his BP daily with dates and times, adv him to monitor his BP daily at the same time. Adv pt that I will fwd an update to Dr.C and we will call back with his recommendation. Adv pt to contact the office if symptoms worsen prior to a call back. Pt agreeable with plan and voiced appreciation for the assistance.

## 2018-03-17 NOTE — Telephone Encounter (Signed)
My suspicion is that his dizziness is not BP related. Ideally, when he checks BP, check both sitting and after standing up for the log. MCr

## 2018-03-17 NOTE — Telephone Encounter (Signed)
Spoke with pt and his wife adv them of Dr.Cs response below. Pt and his wife voiced appreciation for the call and verbalized understanding.

## 2018-03-19 ENCOUNTER — Encounter (HOSPITAL_COMMUNITY): Payer: Medicare Other

## 2018-03-21 ENCOUNTER — Encounter (HOSPITAL_COMMUNITY): Payer: Medicare Other

## 2018-03-24 ENCOUNTER — Encounter (HOSPITAL_COMMUNITY): Payer: Medicare Other

## 2018-03-26 ENCOUNTER — Encounter (HOSPITAL_COMMUNITY)
Admission: RE | Admit: 2018-03-26 | Discharge: 2018-03-26 | Disposition: A | Discharge status: Home / Self Care | Payer: Medicare Other | Source: Ambulatory Visit | Attending: Cardiovascular Disease | Admitting: Cardiovascular Disease

## 2018-03-26 DIAGNOSIS — Z48812 Encounter for surgical aftercare following surgery on the circulatory system: Secondary | ICD-10-CM | POA: Diagnosis not present

## 2018-03-26 DIAGNOSIS — Z9861 Coronary angioplasty status: Secondary | ICD-10-CM

## 2018-03-26 DIAGNOSIS — I2510 Atherosclerotic heart disease of native coronary artery without angina pectoris: Secondary | ICD-10-CM

## 2018-03-26 DIAGNOSIS — I214 Non-ST elevation (NSTEMI) myocardial infarction: Secondary | ICD-10-CM

## 2018-03-28 ENCOUNTER — Encounter (HOSPITAL_COMMUNITY)
Admission: RE | Admit: 2018-03-28 | Discharge: 2018-03-28 | Disposition: A | Discharge status: Home / Self Care | Payer: Medicare Other | Source: Ambulatory Visit | Attending: Cardiovascular Disease | Admitting: Cardiovascular Disease

## 2018-03-28 VITALS — Ht 71.25 in | Wt 197.1 lb

## 2018-03-28 DIAGNOSIS — Z9861 Coronary angioplasty status: Secondary | ICD-10-CM

## 2018-03-28 DIAGNOSIS — Z48812 Encounter for surgical aftercare following surgery on the circulatory system: Secondary | ICD-10-CM | POA: Diagnosis not present

## 2018-03-28 DIAGNOSIS — I214 Non-ST elevation (NSTEMI) myocardial infarction: Secondary | ICD-10-CM

## 2018-03-28 DIAGNOSIS — I2510 Atherosclerotic heart disease of native coronary artery without angina pectoris: Secondary | ICD-10-CM

## 2018-03-31 ENCOUNTER — Encounter (HOSPITAL_COMMUNITY): Payer: Medicare Other

## 2018-04-01 ENCOUNTER — Ambulatory Visit (INDEPENDENT_AMBULATORY_CARE_PROVIDER_SITE_OTHER): Payer: Medicare Other | Admitting: Pharmacist

## 2018-04-01 VITALS — BP 158/94 | HR 58 | Ht 72.0 in

## 2018-04-01 DIAGNOSIS — I10 Essential (primary) hypertension: Secondary | ICD-10-CM

## 2018-04-01 DIAGNOSIS — Z9861 Coronary angioplasty status: Secondary | ICD-10-CM

## 2018-04-01 DIAGNOSIS — I2510 Atherosclerotic heart disease of native coronary artery without angina pectoris: Secondary | ICD-10-CM

## 2018-04-01 MED ORDER — AMLODIPINE BESYLATE 5 MG PO TABS: 5.0000 mg | ORAL_TABLET | Freq: Every day | ORAL | 1 refills | Status: DC

## 2018-04-01 NOTE — Progress Notes (Signed)
Patient ID: [name]                 DOB: 09-Jun-1950                      MRN: [id number]     HPI: Kevin Wade is a 67 y.o. male referred by Dr. Royann Shivers to HTN clinic. PMH includes unstable angina, stent-placement, hyperlipidemia, and hypertension.  Patient was complaining of dizziness with positional changes and constipation. Dr Royann Shivers recommended checking BP standing and sitting but suspected dizziness was not related to BP.  Patient received cortisone shot Nov/11 on left shoulder.   Patient presents to clinic accompany by spouse. Still experiencing some dizziness but significantly improved since last week and not related to BP. He never checked his BP standing but all number remains above 130 systolic,   Current HTN meds:  Lisinopril 40mg  daily - at bedtime  BP goal: 130/80  Family History: patient's family history includes Hypertension in his father  Social History: denies tobacco use, alcohol socially  Diet: working on low fat and low sodium since heart attack  Exercise: cardiac rehab  Home BP readings:   10 readings; average 145/80; pulse 49-59bpm Home monitor OMRON arm cuff - accurate within when compared with manual reading  Wt Readings from Last 3 Encounters:  03/28/18 197 lb 1.5 oz (89.4 kg)  03/06/18 196 lb 6.4 oz (89.1 kg)  01/09/18 190 lb 4.1 oz (86.3 kg)   BP Readings from Last 3 Encounters:  04/01/18 (!) 158/94  03/06/18 139/83  12/10/17 (!) 148/74   Pulse Readings from Last 3 Encounters:  04/01/18 (!) 58  03/06/18 (!) 55  12/10/17 (!) 58     Past Medical History:  Diagnosis Date  . Arthritis   . Hyperlipidemia   . Hypertension     Current Outpatient Medications on File Prior to Visit  Medication Sig Dispense Refill  . aspirin EC 81 MG tablet Take 81 mg by mouth daily.    Marland Kitchen atorvastatin (LIPITOR) 80 MG tablet Take 1 tablet (80 mg total) by mouth daily at 6 PM. 30 tablet 0  . famotidine (PEPCID) 20 MG tablet Take 20 mg by mouth 2  (two) times daily.    Marland Kitchen lisinopril (PRINIVIL,ZESTRIL) 40 MG tablet Take 1 tablet (40 mg total) by mouth daily. 90 tablet 3  . Misc Natural Products (OSTEO BI-FLEX JOINT SHIELD) TABS Take 1 tablet by mouth daily.    . Multiple Vitamins-Minerals (ONE-A-DAY MENS 50+ ADVANTAGE) TABS Take 1 tablet by mouth daily.    . Omega-3 1000 MG CAPS Take 1,000 mg by mouth daily.    . ticagrelor (BRILINTA) 90 MG TABS tablet Take 1 tablet (90 mg total) by mouth 2 (two) times daily. 180 tablet 1  . nitroGLYCERIN (NITROSTAT) 0.4 MG SL tablet Place 1 tablet (0.4 mg total) under the tongue every 5 (five) minutes x 3 doses as needed for chest pain. (Patient not taking: Reported on 04/01/2018) 20 tablet 0   No current facility-administered medications on file prior to visit.     No Known Allergies  Blood pressure (!) 158/94, pulse (!) 58, height 6' (1.829 m).  Essential hypertension Blood pressure remains above goal during office visit. Home BP also above goal with an average reading of 145/80. Patient is not a candidate for BB due to low HR at baseline. Lisinopril already at maximum dose and tolerating well. Will add amlodipine 5mg  daily to current therapy and follow up in  4 weeks. Patient is to continue working on positive lifestyle modification, monitor BP twice daily, and bring records to follow up visit.    Kevin Wade PharmD, BCPS, CPP [facility] [address] [place] [phone] 04/03/2018 5:34 PM

## 2018-04-01 NOTE — Patient Instructions (Signed)
Return for a follow up appointment in 4 weeks  Check your blood pressure at home daily (if able) and keep record of the readings.  Take your BP meds as follows: *START taking amlodipine 5mg  daily*  Bring all of your meds, your BP cuff and your record of home blood pressures to your next appointment.  Exercise as you're able, try to walk approximately 30 minutes per day.  Keep salt intake to a minimum, especially watch canned and prepared boxed foods.  Eat more fresh fruits and vegetables and fewer canned items.  Avoid eating in fast food restaurants.    HOW TO TAKE YOUR BLOOD PRESSURE: . Rest 5 minutes before taking your blood pressure. .  Don't smoke or drink caffeinated beverages for at least 30 minutes before. . Take your blood pressure before (not after) you eat. . Sit comfortably with your back supported and both feet on the floor (don't cross your legs). . Elevate your arm to heart level on a table or a desk. . Use the proper sized cuff. It should fit smoothly and snugly around your bare upper arm. There should be enough room to slip a fingertip under the cuff. The bottom edge of the cuff should be 1 inch above the crease of the elbow. . Ideally, take 3 measurements at one sitting and record the average.

## 2018-04-02 ENCOUNTER — Encounter (HOSPITAL_COMMUNITY): Payer: Medicare Other

## 2018-04-03 ENCOUNTER — Encounter: Payer: Self-pay | Admitting: Pharmacist

## 2018-04-03 ENCOUNTER — Telehealth (HOSPITAL_COMMUNITY): Payer: Self-pay | Admitting: Cardiac Rehabilitation

## 2018-04-03 ENCOUNTER — Encounter (HOSPITAL_COMMUNITY): Payer: Self-pay

## 2018-04-03 NOTE — Progress Notes (Signed)
Cardiac Individual Treatment Plan  Patient Details  Name: Kevin Wade MRN: [id number] Date of Birth: [birth year] Referring Provider:   Flowsheet Row CARDIAC REHAB PHASE II ORIENTATION from 01/09/2018 in [place]  Referring Provider  Croitoru, Mihai MD       Initial Encounter Date:  [place] from 01/09/2018 in [place]  Date  01/09/18      Visit Diagnosis: Non-ST elevation (NSTEMI) myocardial infarction [facility])  CAD S/P percutaneous coronary angioplasty  Patient's Home Medications on Admission:  Current Outpatient Medications:  .  amLODipine (NORVASC) 5 MG tablet, Take 1 tablet (5 mg total) by mouth at bedtime., Disp: 30 tablet, Rfl: 1 .  aspirin EC 81 MG tablet, Take 81 mg by mouth daily., Disp: , Rfl:  .  atorvastatin (LIPITOR) 80 MG tablet, Take 1 tablet (80 mg total) by mouth daily at 6 PM., Disp: 30 tablet, Rfl: 0 .  famotidine (PEPCID) 20 MG tablet, Take 20 mg by mouth 2 (two) times daily., Disp: , Rfl:  .  lisinopril (PRINIVIL,ZESTRIL) 40 MG tablet, Take 1 tablet (40 mg total) by mouth daily., Disp: 90 tablet, Rfl: 3 .  Misc Natural Products (OSTEO BI-FLEX JOINT SHIELD) TABS, Take 1 tablet by mouth daily., Disp: , Rfl:  .  Multiple Vitamins-Minerals (ONE-A-DAY MENS 50+ ADVANTAGE) TABS, Take 1 tablet by mouth daily., Disp: , Rfl:  .  nitroGLYCERIN (NITROSTAT) 0.4 MG SL tablet, Place 1 tablet (0.4 mg total) under the tongue every 5 (five) minutes x 3 doses as needed for chest pain. (Patient not taking: Reported on 04/01/2018), Disp: 20 tablet, Rfl: 0 .  Omega-3 1000 MG CAPS, Take 1,000 mg by mouth daily., Disp: , Rfl:  .  ticagrelor (BRILINTA) 90 MG TABS tablet, Take 1 tablet (90 mg total) by mouth 2 (two) times daily., Disp: 180 tablet, Rfl: 1  Past Medical History: Past Medical History:  Diagnosis Date  . Arthritis   . Hyperlipidemia   . Hypertension     Tobacco  Use: Social History   Tobacco Use  Smoking Status Never Smoker  Smokeless Tobacco Never Used    Labs: Recent Review Scientist, physiological    Labs for ITP Cardiac and Pulmonary Rehab Latest Ref Rng & Units 11/14/2017   Cholestrol 0 - 200 mg/dL 143   LDLCALC 0 - 99 mg/dL 80   HDL >40 mg/dL 47   Trlycerides <150 mg/dL 82      Capillary Blood Glucose: No results found for: GLUCAP   Exercise Target Goals: Exercise Program Goal: Individual exercise prescription set using results from initial 6 min walk test and THRR while considering  patient's activity barriers and safety.   Exercise Prescription Goal: Initial exercise prescription builds to 30-45 minutes a day of aerobic activity, 2-3 days per week.  Home exercise guidelines will be given to patient during program as part of exercise prescription that the participant will acknowledge.  Activity Barriers & Risk Stratification: Activity Barriers & Cardiac Risk Stratification - 01/09/18 1144    Activity Barriers & Cardiac Risk Stratification          Activity Barriers  Joint Problems;Other (comment)    Comments  Left Shoulder Chronic Pain, Left Bicep Tear    Cardiac Risk Stratification  Moderate           6 Minute Walk: 6 Minute Walk    6 Minute Walk    Row Name 01/09/18 1143 03/28/18 1437  Phase  Initial  Discharge   Distance  1584 feet  1919 feet   Distance % Change  no documentation  21.15 %   Distance Feet Change  no documentation  335 ft   Walk Time  6 minutes  6 minutes   # of Rest Breaks  0  0   MPH  3  3.63   METS  2.99  4.45   RPE  12  9   Perceived Dyspnea   0  no documentation   VO2 Peak  10.5  15.56   Symptoms  No  No   Resting HR  50 bpm  59 bpm   Resting BP  120/62  148/88   Resting Oxygen Saturation   98 %  no documentation   Exercise Oxygen Saturation  during 6 min walk  99 %  no documentation   Max Ex. HR  88 bpm  105 bpm   Max Ex. BP  128/80  160/98   2 Minute Post BP  116/70  130/80           Oxygen Initial Assessment:   Oxygen Re-Evaluation:   Oxygen Discharge (Final Oxygen Re-Evaluation):   Initial Exercise Prescription: Initial Exercise Prescription - 01/09/18 1100    Date of Initial Exercise RX and Referring Provider          Date  01/09/18    Referring Provider  Sanda Klein MD     Expected Discharge Date  04/04/18        Treadmill          MPH  2.4    Grade  0    Minutes  10    METs  2.84        Recumbant Bike          Level  2    Watts  30    Minutes  10    METs  3.11        NuStep          Level  2    SPM  80    Minutes  10    METs  2.7        Prescription Details          Frequency (times per week)  3x    Duration  Progress to 30 minutes of continuous aerobic without signs/symptoms of physical distress        Intensity          THRR 40-80% of Max Heartrate  61-122    Ratings of Perceived Exertion  11-13    Perceived Dyspnea  0-4        Progression          Progression  Continue progressive overload as per policy without signs/symptoms or physical distress.        Resistance Training          Training Prescription  Yes    Weight  3lbs    Reps  10-15           Perform Capillary Blood Glucose checks as needed.  Exercise Prescription Changes: Exercise Prescription Changes    Response to Exercise    Row Name 01/29/18 1328 02/10/18 1328 03/07/18 1315 04/01/18 1546   Blood Pressure (Admit)  110/66  142/82  144/80  148/88   Blood Pressure (Exercise)  140/80  164/72  220/100  160/98   Blood Pressure (Exit)  118/68  122/80  138/70  130/88   Heart Rate (  Admit)  53 bpm  52 bpm  61 bpm  59 bpm   Heart Rate (Exercise)  104 bpm  98 bpm  107 bpm  115 bpm   Heart Rate (Exit)  51 bpm  50 bpm  56 bpm  63 bpm   Rating of Perceived Exertion (Exercise)  '11  11  13  11   ' Symptoms  none  none  none  none   Comments  no documentation  no documentation  BP was too high. Exercise stopped due to this.   no documentation   Duration   Progress to 30 minutes of  aerobic without signs/symptoms of physical distress  Progress to 30 minutes of  aerobic without signs/symptoms of physical distress  Progress to 30 minutes of  aerobic without signs/symptoms of physical distress  Progress to 30 minutes of  aerobic without signs/symptoms of physical distress   Intensity  THRR unchanged  THRR unchanged  THRR unchanged  THRR unchanged       Progression    Row Name 01/29/18 1328 02/10/18 1328 03/07/18 1315 04/01/18 1546   Progression  Continue to progress workloads to maintain intensity without signs/symptoms of physical distress.  Continue to progress workloads to maintain intensity without signs/symptoms of physical distress.  Continue to progress workloads to maintain intensity without signs/symptoms of physical distress.  Continue to progress workloads to maintain intensity without signs/symptoms of physical distress.   Average METs  3.6  4.1  4.6  4.92       Resistance Training    Row Name 01/29/18 1328 02/10/18 1328 03/07/18 1315 04/01/18 1546   Training Prescription  No Relaxation day, no weights.  Yes  Yes  Yes   Weight  no documentation  4lbs  4lbs  3 lbs.    Reps  no documentation  10-15  10-15  10-15   Time  no documentation  10 Minutes  10 Minutes  10 Minutes       Interval Training    Row Name 01/29/18 1328 02/10/18 1328 03/07/18 1315 04/01/18 1546   Interval Training  No  No  No  No       Treadmill    Row Name 01/29/18 1328 02/10/18 1328 03/07/18 1315 04/01/18 1546   MPH  2.6  3  3.4  3.4   Grade  '1  1  1  1   ' Minutes  '10  10  10  10   ' METs  3.35  3.71  4.07  4.07       Recumbant [place] Name 01/29/18 1328 02/10/18 1328 03/07/18 1315 04/01/18 1546   Level  4  4 Upright SciFit Bike  4 Upright SciFit Bike  4 Upright SciFit Bike   Watts  50  no documentation  no documentation  no documentation   Minutes  '10  10  10  10   ' METs  3.85  no documentation  4.7  6.2       NuStep    Row Name 01/29/18 1328  02/10/18 1328 03/07/18 1315 04/01/18 1546   Level  '4  4  4  4   ' SPM  80  85  85  85   Minutes  '10  10  10  10   ' METs  3.5  4.4  4.9  4.5       Home Exercise Plan    Row Name 01/29/18 1328 02/10/18 1328 03/07/18 1315 04/01/18 1546   Plans to continue exercise  at  Home (comment)  Home (comment)  Home (comment)  Home (comment)   Frequency  Add 3 additional days to program exercise sessions.  Add 3 additional days to program exercise sessions.  Add 3 additional days to program exercise sessions.  Add 3 additional days to program exercise sessions.   Initial Home Exercises Provided  01/29/18  01/29/18  01/29/18  01/29/18          Exercise Comments: Exercise Comments    Row Name 01/29/18 1353 02/10/18 1351 03/13/18 1003 04/02/18 1520   Exercise Comments  Reviewed home exericse guidelines, METs, and goals with patient.  Reviewed METs and goals with patient.  Pt increases MET level. Will continue to monitor BP. Continues to exercise at home.   Pt is tolerating exercise well with new BP med. Will continue to exercise at home.       Exercise Goals and Review: Exercise Goals    Exercise Goals    Row Name 01/09/18 1145   Increase Physical Activity  Yes   Intervention  Develop an individualized exercise prescription for aerobic and resistive training based on initial evaluation findings, risk stratification, comorbidities and participant's personal goals.;Provide advice, education, support and counseling about physical activity/exercise needs.   Expected Outcomes  Short Term: Attend rehab on a regular basis to increase amount of physical activity.;Long Term: Add in home exercise to make exercise part of routine and to increase amount of physical activity.;Long Term: Exercising regularly at least 3-5 days a week.   Increase Strength and Stamina  Yes   Intervention  Provide advice, education, support and counseling about physical activity/exercise needs.;Develop an individualized exercise  prescription for aerobic and resistive training based on initial evaluation findings, risk stratification, comorbidities and participant's personal goals.   Expected Outcomes  Short Term: Increase workloads from initial exercise prescription for resistance, speed, and METs.;Short Term: Perform resistance training exercises routinely during rehab and add in resistance training at home;Long Term: Improve cardiorespiratory fitness, muscular endurance and strength as measured by increased METs and functional capacity (6MWT)   Able to understand and use rate of perceived exertion (RPE) scale  Yes   Intervention  Provide education and explanation on how to use RPE scale   Expected Outcomes  Short Term: Able to use RPE daily in rehab to express subjective intensity level;Long Term:  Able to use RPE to guide intensity level when exercising independently   Knowledge and understanding of Target Heart Rate Range (THRR)  Yes   Intervention  Provide education and explanation of THRR including how the numbers were predicted and where they are located for reference   Expected Outcomes  Short Term: Able to state/look up THRR;Long Term: Able to use THRR to govern intensity when exercising independently;Short Term: Able to use daily as guideline for intensity in rehab   Understanding of Exercise Prescription  Yes   Intervention  Provide education, explanation, and written materials on patient's individual exercise prescription   Expected Outcomes  Short Term: Able to explain program exercise prescription;Long Term: Able to explain home exercise prescription to exercise independently          Exercise Goals Re-Evaluation : Exercise Goals Re-Evaluation    Exercise Goal Re-Evaluation    Row Name 01/29/18 1353 02/10/18 1351 03/13/18 1001 04/02/18 1519   Exercise Goals Review  Understanding of Exercise Prescription;Knowledge and understanding of Target Heart Rate Range (THRR);Able to understand and use rate of  perceived exertion (RPE) scale  Understanding of Exercise Prescription;Knowledge and understanding of  Target Heart Rate Range (THRR);Able to understand and use rate of perceived exertion (RPE) scale;Increase Strength and Stamina;Increase Physical Activity  Increase Physical Activity;Increase Strength and Stamina;Able to understand and use rate of perceived exertion (RPE) scale;Knowledge and understanding of Target Heart Rate Range (THRR);Understanding of Exercise Prescription;Able to check pulse independently  Increase Physical Activity;Increase Strength and Stamina;Able to check pulse independently;Knowledge and understanding of Target Heart Rate Range (THRR);Able to understand and use rate of perceived exertion (RPE) scale;Understanding of Exercise Prescription   Comments  Reviewed home exercise guidelines with patient including THRR, RPE scale and endpoints for exercise. Pt is walking 15-20 minutes, 3 days/week as his mode of home exercise. Encouraged pt to increase duration to 30 minutes, and pt is amenable to this.  Patient is walking 15 minutes and using home exercise equipment 15 minutes on Tuesday, Thursday, and Saturday. Pt is also dancing as a mode of exercise.   Pt BP has been high with exercise. With rest BP becomes normal. Doctor was contacted.   Pt is tolerating exercise well. MET level has increased to 4.92. Pt has been taking new BEP med to help lower BP during exercise. New med is helping with exercise BP   Expected Outcomes  Patient will increase walking duration at home to 30 minutes to help improve cardiorespiratory fitness.  Patient will increase workloads to help achieve personal health and fitness goals.  Will continue to monitor Pt and BP. Continue exercise prescription.   Will continue to monitor and progress Pt as tolerated.           Discharge Exercise Prescription (Final Exercise Prescription Changes): Exercise Prescription Changes - 04/01/18 1546    Response to Exercise           Blood Pressure (Admit)  148/88    Blood Pressure (Exercise)  160/98    Blood Pressure (Exit)  130/88    Heart Rate (Admit)  59 bpm    Heart Rate (Exercise)  115 bpm    Heart Rate (Exit)  63 bpm    Rating of Perceived Exertion (Exercise)  11    Symptoms  none    Duration  Progress to 30 minutes of  aerobic without signs/symptoms of physical distress    Intensity  THRR unchanged        Progression          Progression  Continue to progress workloads to maintain intensity without signs/symptoms of physical distress.    Average METs  4.92        Resistance Training          Training Prescription  Yes    Weight  3 lbs.     Reps  10-15    Time  10 Minutes        Interval Training          Interval Training  No        Treadmill          MPH  3.4    Grade  1    Minutes  10    METs  4.07        Recumbant Bike          Level  4   Upright SciFit Bike   Minutes  10    METs  6.2        NuStep          Level  4    SPM  85    Minutes  10  METs  4.5        Home Exercise Plan          Plans to continue exercise at  Home (comment)    Frequency  Add 3 additional days to program exercise sessions.    Initial Home Exercises Provided  01/29/18           Nutrition:  Target Goals: Understanding of nutrition guidelines, daily intake of sodium <1539m, cholesterol <2024m calories 30% from fat and 7% or less from saturated fats, daily to have 5 or more servings of fruits and vegetables.  Biometrics: Pre Biometrics - 01/09/18 1144    Pre Biometrics          Height  5' 11.25" (1.81 m)    Weight  86.3 kg    Waist Circumference  37.5 inches    Hip Circumference  39 inches    Waist to Hip Ratio  0.96 %    BMI (Calculated)  26.34    Triceps Skinfold  13 mm    % Body Fat  25 %    Grip Strength  38 kg    Flexibility  8 in    Single Leg Stand  30 seconds          Post Biometrics - 03/28/18 1442     Post  Biometrics          Height  5' 11.25" (1.81 m)     Weight  89.4 kg    Waist Circumference  38.5 inches    Hip Circumference  41.5 inches    Waist to Hip Ratio  0.93 %    BMI (Calculated)  27.29    Triceps Skinfold  14 mm    % Body Fat  26.1 %    Grip Strength  42 kg    Flexibility  9.5 in    Single Leg Stand  25.06 seconds           Nutrition Therapy Plan and Nutrition Goals: Nutrition Therapy & Goals - 01/09/18 1150    Nutrition Therapy          Diet  heart healthy        Personal Nutrition Goals          Nutrition Goal  Pt to identify and limit food sources of saturated fat, trans fat, refined carbohydrates and sodium        Intervention Plan          Intervention  Prescribe, educate and counsel regarding individualized specific dietary modifications aiming towards targeted core components such as weight, hypertension, lipid management, diabetes, heart failure and other comorbidities.    Expected Outcomes  Short Term Goal: Understand basic principles of dietary content, such as calories, fat, sodium, cholesterol and nutrients.           Nutrition Assessments: Nutrition Assessments - 01/09/18 1151    MEDFICTS Scores          Pre Score  23           Nutrition Goals Re-Evaluation:   Nutrition Goals Re-Evaluation:   Nutrition Goals Discharge (Final Nutrition Goals Re-Evaluation):   Psychosocial: Target Goals: Acknowledge presence or absence of significant depression and/or stress, maximize coping skills, provide positive support system. Participant is able to verbalize types and ability to use techniques and skills needed for reducing stress and depression.  Initial Review & Psychosocial Screening: Initial Psych Review & Screening - 01/09/18 1141    Initial Review  Current issues with  None Identified        Family Dynamics          Good Support System?  Yes   Davine reports his wife as a source of support.       Barriers          Psychosocial barriers to participate in program  There  are no identifiable barriers or psychosocial needs.        Screening Interventions          Interventions  Encouraged to exercise           Quality of Life Scores: Quality of Life - 01/09/18 1142    Quality of Life          Select  Quality of Life        Quality of Life Scores          Health/Function Pre  28.2 %    Socioeconomic Pre  27.08 %    Psych/Spiritual Pre  30 %    Family Pre  28.3 %    GLOBAL Pre  28.39 %          Scores of 19 and below usually indicate a poorer quality of life in these areas.  A difference of  2-3 points is a clinically meaningful difference.  A difference of 2-3 points in the total score of the Quality of Life Index has been associated with significant improvement in overall quality of life, self-image, physical symptoms, and general health in studies assessing change in quality of life.  PHQ-9: Recent Review Flowsheet Data    Depression screen [facility] 2/9 01/20/2018 01/04/2015   Decreased Interest 0 0   Down, Depressed, Hopeless 0 0   PHQ - 2 Score 0 0     Interpretation of Total Score  Total Score Depression Severity:  1-4 = Minimal depression, 5-9 = Mild depression, 10-14 = Moderate depression, 15-19 = Moderately severe depression, 20-27 = Severe depression   Psychosocial Evaluation and Intervention: Psychosocial Evaluation - 01/20/18 1423    Psychosocial Evaluation & Interventions          Interventions  Encouraged to exercise with the program and follow exercise prescription    Comments  no psychsocial needs identified, no interventions necessary. pt enjoys swimming, playing pool and cards at home.     Expected Outcomes  pt will exhibit positive outlook with good coping skills.     Continue Psychosocial Services   No Follow up required           Psychosocial Re-Evaluation: Psychosocial Re-Evaluation    Psychosocial Re-Evaluation    [employer] Name 02/13/18 1105 03/13/18 1447 04/03/18 1529   Current issues with  None Identified  None  Identified  None Identified   Comments  no psychosocial needs identified, no interventions necessary   no psychosocial needs identified, no interventions necessary   no psychosocial needs identified, no interventions necessary    Expected Outcomes  pt will exhibit positive outlook with good coping skills.   pt will exhibit positive outlook with good coping skills.   pt will exhibit positive outlook with good coping skills.    Interventions  Encouraged to attend Cardiac Rehabilitation for the exercise  Encouraged to attend Cardiac Rehabilitation for the exercise  Encouraged to attend Cardiac Rehabilitation for the exercise   Continue Psychosocial Services   No Follow up required  No Follow up required  No Follow up required  Psychosocial Discharge (Final Psychosocial Re-Evaluation): Psychosocial Re-Evaluation - 04/03/18 1529    Psychosocial Re-Evaluation          Current issues with  None Identified    Comments  no psychosocial needs identified, no interventions necessary     Expected Outcomes  pt will exhibit positive outlook with good coping skills.     Interventions  Encouraged to attend Cardiac Rehabilitation for the exercise    Continue Psychosocial Services   No Follow up required           Vocational Rehabilitation: Provide vocational rehab assistance to qualifying candidates.   Vocational Rehab Evaluation & Intervention: Vocational Rehab - 01/09/18 1142    Initial Vocational Rehab Evaluation & Intervention          Assessment shows need for Vocational Rehabilitation  No           Education: Education Goals: Education classes will be provided on a weekly basis, covering required topics. Participant will state understanding/return demonstration of topics presented.  Learning Barriers/Preferences: Learning Barriers/Preferences - 01/09/18 1153    Learning Barriers/Preferences          Learning Barriers  Sight    Learning Preferences  Written  Material;Video;Skilled Demonstration           Education Topics: Count Your Pulse:  -Group instruction provided by verbal instruction, demonstration, patient participation and written materials to support subject.  Instructors address importance of being able to find your pulse and how to count your pulse when at home without a heart monitor.  Patients get hands on experience counting their pulse with staff help and individually. Flowsheet Row CARDIAC REHAB PHASE II EXERCISE from 03/28/2018 in [place]  Date  03/07/18  Instruction Review Code  2- Demonstrated Understanding      Heart Attack, Angina, and Risk Factor Modification:  -Group instruction provided by verbal instruction, video, and written materials to support subject.  Instructors address signs and symptoms of angina and heart attacks.    Also discuss risk factors for heart disease and how to make changes to improve heart health risk factors. Flowsheet Row CARDIAC REHAB PHASE II EXERCISE from 03/28/2018 in [place]  Date  02/12/18  Instruction Review Code  2- Demonstrated Understanding      Functional Fitness:  -Group instruction provided by verbal instruction, demonstration, patient participation, and written materials to support subject.  Instructors address safety measures for doing things around the house.  Discuss how to get up and down off the floor, how to pick things up properly, how to safely get out of a chair without assistance, and balance training. Flowsheet Row CARDIAC REHAB PHASE II EXERCISE from 03/28/2018 in [place]  Date  03/28/18  Educator  EP  Instruction Review Code  2- Demonstrated Understanding      Meditation and Mindfulness:  -Group instruction provided by verbal instruction, patient participation, and written materials to support subject.  Instructor addresses importance of mindfulness and  meditation practice to help reduce stress and improve awareness.  Instructor also leads participants through a meditation exercise.  Flowsheet Row CARDIAC REHAB PHASE II EXERCISE from 03/28/2018 in [place]  Date  03/05/18  Educator  RN  Instruction Review Code  2- Demonstrated Understanding      Stretching for Flexibility and Mobility:  -Group instruction provided by verbal instruction, patient participation, and written materials to support subject.  Instructors lead participants  through series of stretches that are designed to increase flexibility thus improving mobility.  These stretches are additional exercise for major muscle groups that are typically performed during regular warm up and cool down.   Hands Only CPR:  -Group verbal, video, and participation provides a basic overview of AHA guidelines for community CPR. Role-play of emergencies allow participants the opportunity to practice calling for help and chest compression technique with discussion of AED use.   Hypertension: -Group verbal and written instruction that provides a basic overview of hypertension including the most recent diagnostic guidelines, risk factor reduction with self-care instructions and medication management.    Nutrition I class: Heart Healthy Eating:  -Group instruction provided by PowerPoint slides, verbal discussion, and written materials to support subject matter. The instructor gives an explanation and review of the Therapeutic Lifestyle Changes diet recommendations, which includes a discussion on lipid goals, dietary fat, sodium, fiber, plant stanol/sterol esters, sugar, and the components of a well-balanced, healthy diet.   Nutrition II class: Lifestyle Skills:  -Group instruction provided by PowerPoint slides, verbal discussion, and written materials to support subject matter. The instructor gives an explanation and review of label reading, grocery shopping for  heart health, heart healthy recipe modifications, and ways to make healthier choices when eating out.   Diabetes Question & Answer:  -Group instruction provided by PowerPoint slides, verbal discussion, and written materials to support subject matter. The instructor gives an explanation and review of diabetes co-morbidities, pre- and post-prandial blood glucose goals, pre-exercise blood glucose goals, signs, symptoms, and treatment of hypoglycemia and hyperglycemia, and foot care basics.   Diabetes Blitz:  -Group instruction provided by PowerPoint slides, verbal discussion, and written materials to support subject matter. The instructor gives an explanation and review of the physiology behind type 1 and type 2 diabetes, diabetes medications and rational behind using different medications, pre- and post-prandial blood glucose recommendations and Hemoglobin A1c goals, diabetes diet, and exercise including blood glucose guidelines for exercising safely.    Portion Distortion:  -Group instruction provided by PowerPoint slides, verbal discussion, written materials, and food models to support subject matter. The instructor gives an explanation of serving size versus portion size, changes in portions sizes over the last 20 years, and what consists of a serving from each food group.   Stress Management:  -Group instruction provided by verbal instruction, video, and written materials to support subject matter.  Instructors review role of stress in heart disease and how to cope with stress positively.   Flowsheet Row CARDIAC REHAB PHASE II EXERCISE from 03/28/2018 in [place]  Date  01/29/18  Instruction Review Code  2- Demonstrated Understanding      Exercising on Your Own:  -Group instruction provided by verbal instruction, power point, and written materials to support subject.  Instructors discuss benefits of exercise, components of exercise, frequency and intensity  of exercise, and end points for exercise.  Also discuss use of nitroglycerin and activating EMS.  Review options of places to exercise outside of rehab.  Review guidelines for sex with heart disease.   Cardiac Drugs I:  -Group instruction provided by verbal instruction and written materials to support subject.  Instructor reviews cardiac drug classes: antiplatelets, anticoagulants, beta blockers, and statins.  Instructor discusses reasons, side effects, and lifestyle considerations for each drug class.   Cardiac Drugs II:  -Group instruction provided by verbal instruction and written materials to support subject.  Instructor reviews cardiac drug classes: angiotensin converting enzyme inhibitors (  ACE-I), angiotensin II receptor blockers (ARBs), nitrates, and calcium channel blockers.  Instructor discusses reasons, side effects, and lifestyle considerations for each drug class. Flowsheet Row CARDIAC REHAB PHASE II EXERCISE from 03/28/2018 in [place]  Date  02/19/18  Educator  Pharmacist  Instruction Review Code  2- Demonstrated Understanding      Anatomy and Physiology of the Circulatory System:  Group verbal and written instruction and models provide basic cardiac anatomy and physiology, with the coronary electrical and arterial systems. Review of: AMI, Angina, Valve disease, Heart Failure, Peripheral Artery Disease, Cardiac Arrhythmia, Pacemakers, and the ICD. Flowsheet Row CARDIAC REHAB PHASE II EXERCISE from 03/28/2018 in [place]  Date  02/05/18  Instruction Review Code  2- Demonstrated Understanding      Other Education:  -Group or individual verbal, written, or video instructions that support the educational goals of the cardiac rehab program.   Holiday Eating Survival Tips:  -Group instruction provided by PowerPoint slides, verbal discussion, and written materials to support subject matter. The instructor gives  patients tips, tricks, and techniques to help them not only survive but enjoy the holidays despite the onslaught of food that accompanies the holidays.   Knowledge Questionnaire Score: Knowledge Questionnaire Score - 01/09/18 1142    Knowledge Questionnaire Score          Pre Score  --   Missing questions needs to complete          Core Components/Risk Factors/Patient Goals at Admission: Personal Goals and Risk Factors at Admission - 01/09/18 1331    Core Components/Risk Factors/Patient Goals on Admission           Weight Management  Yes;Weight Loss    Intervention  Weight Management: Develop a combined nutrition and exercise program designed to reach desired caloric intake, while maintaining appropriate intake of nutrient and fiber, sodium and fats, and appropriate energy expenditure required for the weight goal.;Weight Management: Provide education and appropriate resources to help participant work on and attain dietary goals.;Weight Management/Obesity: Establish reasonable short term and long term weight goals.;Obesity: Provide education and appropriate resources to help participant work on and attain dietary goals.    Admit Weight  211 lb 6.7 oz (95.9 kg)    Goal Weight: Long Term  200 lb (90.7 kg)    Expected Outcomes  Long Term: Adherence to nutrition and physical activity/exercise program aimed toward attainment of established weight goal;Short Term: Continue to assess and modify interventions until short term weight is achieved;Weight Loss: Understanding of general recommendations for a balanced deficit meal plan, which promotes 1-2 lb weight loss per week and includes a negative energy balance of [phone] kcal/d;Understanding recommendations for meals to include 15-35% energy as protein, 25-35% energy from fat, 35-60% energy from carbohydrates, less than 286m of dietary cholesterol, 20-35 gm of total fiber daily;Understanding of distribution of calorie intake throughout the day with  the consumption of 4-5 meals/snacks    Hypertension  Yes    Intervention  Provide education on lifestyle modifcations including regular physical activity/exercise, weight management, moderate sodium restriction and increased consumption of fresh fruit, vegetables, and low fat dairy, alcohol moderation, and smoking cessation.;Monitor prescription use compliance.    Expected Outcomes  Short Term: Continued assessment and intervention until BP is < 140/986mHG in hypertensive participants. < 130/8049mG in hypertensive participants with diabetes, heart failure or chronic kidney disease.;Long Term: Maintenance of blood pressure at goal levels.    Lipids  Yes  Intervention  Provide education and support for participant on nutrition & aerobic/resistive exercise along with prescribed medications to achieve LDL <68m, HDL >450m    Expected Outcomes  Short Term: Participant states understanding of desired cholesterol values and is compliant with medications prescribed. Participant is following exercise prescription and nutrition guidelines.;Long Term: Cholesterol controlled with medications as prescribed, with individualized exercise RX and with personalized nutrition plan. Value goals: LDL < 7054mHDL > 40 mg.           Core Components/Risk Factors/Patient Goals Review:  Goals and Risk Factor Review    Core Components/Risk Factors/Patient Goals Review    Row Name 01/20/18 1426 02/13/18 1138 03/13/18 1447 04/03/18 1527   Personal Goals Review  Weight Management/Obesity;Hypertension;Lipids;Other  Weight Management/Obesity;Hypertension;Lipids;Other  Weight Management/Obesity;Hypertension;Lipids;Other  Weight Management/Obesity;Hypertension;Lipids;Other   Review  pt with multiple CAD RF demonstrates eagerness to participate in CR program. pt personal goals are to complete program learning heart healthy lifestyle and develop home exercise routine.   pt with multiple CAD RF demonstrates eagerness to  participate in CR program. pt personal goals are to complete program learning heart healthy lifestyle and develop home exercise routine. pt notes walking at home is easier for him. pt personal goal is to play basketball with his grandchildren.   pt with multiple CAD RF demonstrates eagerness to participate in CR program. pt personal goals are to complete program learning heart healthy lifestyle and develop home exercise routine. pt notes walking at home is easier for him. pt personal goal is to play basketball with his grandchildren.  pt enjoyed recent cruise however now exhibits exertional hypertension. medication adjustments per cardiology recommendations.    pt with multiple CAD RF demonstrates eagerness to participate in CR program. pt personal goals are to complete program learning heart healthy lifestyle and develop home exercise routine. pt notes walking at home is easier for him. pt personal goal is to play basketball with his grandchildren.  pt hypertension being managed by HTN clinic. pt currently on hold until BP normalizes   Expected Outcomes  pt will participate  in CR exercise, nutrition and lifestyle modification opportunites to decrease overall RF.   pt will participate  in CR exercise, nutrition and lifestyle modification opportunites to decrease overall RF.   pt will participate  in CR exercise, nutrition and lifestyle modification opportunites to decrease overall RF.   pt will participate  in CR exercise, nutrition and lifestyle modification opportunites to decrease overall RF.           Core Components/Risk Factors/Patient Goals at Discharge (Final Review):  Goals and Risk Factor Review - 04/03/18 1527    Core Components/Risk Factors/Patient Goals Review          Personal Goals Review  Weight Management/Obesity;Hypertension;Lipids;Other    Review  pt with multiple CAD RF demonstrates eagerness to participate in CR program. pt personal goals are to complete program learning heart  healthy lifestyle and develop home exercise routine. pt notes walking at home is easier for him. pt personal goal is to play basketball with his grandchildren.  pt hypertension being managed by HTN clinic. pt currently on hold until BP normalizes    Expected Outcomes  pt will participate  in CR exercise, nutrition and lifestyle modification opportunites to decrease overall RF.            ITP Comments: ITP Comments    Row Name 01/09/18 1137 01/17/18 1548 01/20/18 1422 02/13/18 1104 03/13/18 1446   ITP Comments  Dr. TraFransico Him  Medical Director  30 day ITP review.    pt started group exercise. pt tolerated light activity without difficulty. pt oriented to exercise equipment and safety routine.   30 day ITP review.   30 day ITP review. pt with good participation, recent absence from previously scheduled vacation and hypertensive episode.    [place] Name 04/03/18 1526   ITP Comments  30 day ITP review. pt attendance irregular due to hypertensive episodes. pt currently on hold until BP normalizes. pt being treated by Hypertension clinic.       Comments:

## 2018-04-03 NOTE — Telephone Encounter (Signed)
pc to pt to assess reason for continued absence from cardiac rehab.  Pt c/o hypertension with recent medication adjustment. Pt has started Amlodipine with improved results.  Pt plans to return to rehab once BP normalized.  Plan to return April 14, 2018.  Understanding verbalized. Deveron FurlongJoann Rion, RN, BSN Cardiac Pulmonary Rehab

## 2018-04-03 NOTE — Assessment & Plan Note (Signed)
Blood pressure remains above goal during office visit. Home BP also above goal with an average reading of 145/80. Patient is not a candidate for BB due to low HR at baseline. Lisinopril already at maximum dose and tolerating well. Will add amlodipine 5mg  daily to current therapy and follow up in 4 weeks. Patient is to continue working on positive lifestyle modification, monitor BP twice daily, and bring records to follow up visit.

## 2018-04-04 ENCOUNTER — Encounter (HOSPITAL_COMMUNITY): Payer: Medicare Other

## 2018-04-04 NOTE — Progress Notes (Signed)
Thank you :)

## 2018-04-07 ENCOUNTER — Encounter (HOSPITAL_COMMUNITY): Payer: Medicare Other

## 2018-04-09 ENCOUNTER — Encounter (HOSPITAL_COMMUNITY): Payer: Medicare Other

## 2018-04-11 ENCOUNTER — Encounter (HOSPITAL_COMMUNITY): Payer: Medicare Other

## 2018-04-14 ENCOUNTER — Encounter (HOSPITAL_COMMUNITY): Payer: Medicare Other

## 2018-04-16 ENCOUNTER — Encounter (HOSPITAL_COMMUNITY): Payer: Self-pay

## 2018-04-16 ENCOUNTER — Encounter (HOSPITAL_COMMUNITY)
Admission: RE | Admit: 2018-04-16 | Discharge: 2018-04-16 | Disposition: A | Discharge status: Home / Self Care | Payer: Medicare Other | Source: Ambulatory Visit | Attending: Cardiovascular Disease | Admitting: Cardiovascular Disease

## 2018-04-16 DIAGNOSIS — Z955 Presence of coronary angioplasty implant and graft: Secondary | ICD-10-CM | POA: Diagnosis present

## 2018-04-16 DIAGNOSIS — I2510 Atherosclerotic heart disease of native coronary artery without angina pectoris: Secondary | ICD-10-CM

## 2018-04-16 DIAGNOSIS — Z48812 Encounter for surgical aftercare following surgery on the circulatory system: Secondary | ICD-10-CM | POA: Insufficient documentation

## 2018-04-16 DIAGNOSIS — Z9861 Coronary angioplasty status: Secondary | ICD-10-CM

## 2018-04-16 DIAGNOSIS — I214 Non-ST elevation (NSTEMI) myocardial infarction: Secondary | ICD-10-CM

## 2018-04-18 ENCOUNTER — Encounter (HOSPITAL_COMMUNITY): Payer: Medicare Other

## 2018-04-21 ENCOUNTER — Encounter (HOSPITAL_COMMUNITY): Payer: Medicare Other

## 2018-04-23 ENCOUNTER — Other Ambulatory Visit: Payer: Self-pay | Admitting: Cardiovascular Disease

## 2018-04-23 ENCOUNTER — Encounter (HOSPITAL_COMMUNITY): Payer: Medicare Other

## 2018-04-30 NOTE — Progress Notes (Signed)
Discharge Progress Report  Patient Details  Name: Kevin Wade MRN: [id number] Date of Birth: 1951-05-02 Referring Provider:   Flowsheet Row CARDIAC REHAB PHASE II ORIENTATION from 01/09/2018 in [facility]  Referring Provider  Croitoru, Mihai MD        Number of Visits: 21  Reason for Discharge:  Patient reached a stable level of exercise.  Smoking History:  Social History   Tobacco Use  Smoking Status Never Smoker  Smokeless Tobacco Never Used    Diagnosis:  Non-ST elevation (NSTEMI) myocardial infarction ([place])  CAD S/P percutaneous coronary angioplasty  ADL [facility]:   Initial Exercise Prescription: Initial Exercise Prescription - 01/09/18 1100    Date of Initial Exercise RX and Referring Provider          Date  01/09/18    Referring Provider  Sanda Klein MD     Expected Discharge Date  04/04/18        Treadmill          MPH  2.4    Grade  0    Minutes  10    METs  2.84        Recumbant Bike          Level  2    Watts  30    Minutes  10    METs  3.11        NuStep          Level  2    SPM  80    Minutes  10    METs  2.7        Prescription Details          Frequency (times per week)  3x    Duration  Progress to 30 minutes of continuous aerobic without signs/symptoms of physical distress        Intensity          THRR 40-80% of Max Heartrate  61-122    Ratings of Perceived Exertion  11-13    Perceived Dyspnea  0-4        Progression          Progression  Continue progressive overload as per policy without signs/symptoms or physical distress.        Resistance Training          Training Prescription  Yes    Weight  3lbs    Reps  10-15           Discharge Exercise Prescription (Final Exercise Prescription Changes): Exercise Prescription Changes - 04/30/18 1500    Response to Exercise          Blood Pressure (Admit)  128/70    Blood Pressure (Exercise)  180/84    Blood Pressure (Exit)   138/78    Heart Rate (Admit)  90 bpm    Heart Rate (Exercise)  115 bpm    Heart Rate (Exit)  91 bpm    Rating of Perceived Exertion (Exercise)  10    Symptoms  none    Duration  Progress to 30 minutes of  aerobic without signs/symptoms of physical distress    Intensity  THRR unchanged        Progression          Progression  Continue to progress workloads to maintain intensity without signs/symptoms of physical distress.    Average METs  5        Resistance Training  Training Prescription  No        Interval Training          Interval Training  No        Treadmill          MPH  3.4    Grade  1    Minutes  10    METs  4.07        Recumbant Bike          Level  4   Upright SciFit Bike   Minutes  10    METs  6.2        NuStep          Level  4    SPM  85    Minutes  10    METs  4.5        Home Exercise Plan          Plans to continue exercise at  Home (comment)    Frequency  Add 3 additional days to program exercise sessions.    Initial Home Exercises Provided  01/29/18           Functional Capacity: 6 Minute Walk    6 Minute Walk    Row Name 01/09/18 1143 03/28/18 1437   Phase  Initial  Discharge   Distance  1584 feet  1919 feet   Distance % Change  no documentation  21.15 %   Distance Feet Change  no documentation  335 ft   Walk Time  6 minutes  6 minutes   # of Rest Breaks  0  0   MPH  3  3.63   METS  2.99  4.45   RPE  12  9   Perceived Dyspnea   0  no documentation   VO2 Peak  10.5  15.56   Symptoms  No  No   Resting HR  50 bpm  59 bpm   Resting BP  120/62  148/88   Resting Oxygen Saturation   98 %  no documentation   Exercise Oxygen Saturation  during 6 min walk  99 %  no documentation   Max Ex. HR  88 bpm  105 bpm   Max Ex. BP  128/80  160/98   2 Minute Post BP  116/70  130/80          Psychological, QOL, Others - Outcomes: PHQ 2/9: Depression screen [facility] 2/9 04/16/2018 01/20/2018 01/04/2015  Decreased Interest 0 0 0   Down, Depressed, Hopeless 0 0 0  PHQ - 2 Score 0 0 0    Quality of Life: Quality of Life - 04/16/18 1433    Quality of Life          Select  Quality of Life        Quality of Life Scores          Health/Function Post  29.1 %    Socioeconomic Post  28.44 %    Psych/Spiritual Post  30 %    Family Post  27.8 %    GLOBAL Post  28.94 %           Personal Goals: Goals established at orientation with interventions provided to work toward goal. Personal Goals and Risk Factors at Admission - 01/09/18 1331    Core Components/Risk Factors/Patient Goals on Admission           Weight Management  Yes;Weight Loss    Intervention  Weight Management: Develop a combined nutrition  and exercise program designed to reach desired caloric intake, while maintaining appropriate intake of nutrient and fiber, sodium and fats, and appropriate energy expenditure required for the weight goal.;Weight Management: Provide education and appropriate resources to help participant work on and attain dietary goals.;Weight Management/Obesity: Establish reasonable short term and long term weight goals.;Obesity: Provide education and appropriate resources to help participant work on and attain dietary goals.    Admit Weight  211 lb 6.7 oz (95.9 kg)    Goal Weight: Long Term  200 lb (90.7 kg)    Expected Outcomes  Long Term: Adherence to nutrition and physical activity/exercise program aimed toward attainment of established weight goal;Short Term: Continue to assess and modify interventions until short term weight is achieved;Weight Loss: Understanding of general recommendations for a balanced deficit meal plan, which promotes 1-2 lb weight loss per week and includes a negative energy balance of [phone] kcal/d;Understanding recommendations for meals to include 15-35% energy as protein, 25-35% energy from fat, 35-60% energy from carbohydrates, less than '200mg'$  of dietary cholesterol, 20-35 gm of total fiber  daily;Understanding of distribution of calorie intake throughout the day with the consumption of 4-5 meals/snacks    Hypertension  Yes    Intervention  Provide education on lifestyle modifcations including regular physical activity/exercise, weight management, moderate sodium restriction and increased consumption of fresh fruit, vegetables, and low fat dairy, alcohol moderation, and smoking cessation.;Monitor prescription use compliance.    Expected Outcomes  Short Term: Continued assessment and intervention until BP is < 140/71m HG in hypertensive participants. < 130/862mHG in hypertensive participants with diabetes, heart failure or chronic kidney disease.;Long Term: Maintenance of blood pressure at goal levels.    Lipids  Yes    Intervention  Provide education and support for participant on nutrition & aerobic/resistive exercise along with prescribed medications to achieve LDL '70mg'$ , HDL >'40mg'$ .    Expected Outcomes  Short Term: Participant states understanding of desired cholesterol values and is compliant with medications prescribed. Participant is following exercise prescription and nutrition guidelines.;Long Term: Cholesterol controlled with medications as prescribed, with individualized exercise RX and with personalized nutrition plan. Value goals: LDL < '70mg'$ , HDL > 40 mg.            Personal Goals Discharge: Goals and Risk Factor Review    Core Components/Risk Factors/Patient Goals Review    Row Name 01/20/18 1426 02/13/18 1138 03/13/18 1447 04/03/18 1527 04/16/18 1704   Personal Goals Review  Weight Management/Obesity;Hypertension;Lipids;Other  Weight Management/Obesity;Hypertension;Lipids;Other  Weight Management/Obesity;Hypertension;Lipids;Other  Weight Management/Obesity;Hypertension;Lipids;Other  Weight Management/Obesity;Hypertension;Lipids;Other   Review  pt with multiple CAD RF demonstrates eagerness to participate in CR program. pt personal goals are to complete program learning  heart healthy lifestyle and develop home exercise routine.   pt with multiple CAD RF demonstrates eagerness to participate in CR program. pt personal goals are to complete program learning heart healthy lifestyle and develop home exercise routine. pt notes walking at home is easier for him. pt personal goal is to play basketball with his grandchildren.   pt with multiple CAD RF demonstrates eagerness to participate in CR program. pt personal goals are to complete program learning heart healthy lifestyle and develop home exercise routine. pt notes walking at home is easier for him. pt personal goal is to play basketball with his grandchildren.  pt enjoyed recent cruise however now exhibits exertional hypertension. medication adjustments per cardiology recommendations.    pt with multiple CAD RF demonstrates eagerness to participate in CR program. pt personal goals are to  complete program learning heart healthy lifestyle and develop home exercise routine. pt notes walking at home is easier for him. pt personal goal is to play basketball with his grandchildren.  pt hypertension being managed by HTN clinic. pt currently on hold until BP normalizes  pt with multiple CAD RF demonstrates eagerness to participate in CR program. pt personal goals are to complete program learning heart healthy lifestyle and develop home exercise routine. pt notes walking at home is easier for him. pt personal goal is to play basketball with his grandchildren.  pt hypertension being managed by HTN clinic.  pt reports he is pleased to have develop regular exercise routine and get BP under better control.    Expected Outcomes  pt will participate  in CR exercise, nutrition and lifestyle modification opportunites to decrease overall RF.   pt will participate  in CR exercise, nutrition and lifestyle modification opportunites to decrease overall RF.   pt will participate  in CR exercise, nutrition and lifestyle modification opportunites to  decrease overall RF.   pt will participate  in CR exercise, nutrition and lifestyle modification opportunites to decrease overall RF.   pt will participat in exercise, nutrition and lifestyle modification opportunites in the community.           Exercise Goals and Review: Exercise Goals    Exercise Goals    Row Name 01/09/18 1145   Increase Physical Activity  Yes   Intervention  Develop an individualized exercise prescription for aerobic and resistive training based on initial evaluation findings, risk stratification, comorbidities and participant's personal goals.;Provide advice, education, support and counseling about physical activity/exercise needs.   Expected Outcomes  Short Term: Attend rehab on a regular basis to increase amount of physical activity.;Long Term: Add in home exercise to make exercise part of routine and to increase amount of physical activity.;Long Term: Exercising regularly at least 3-5 days a week.   Increase Strength and Stamina  Yes   Intervention  Provide advice, education, support and counseling about physical activity/exercise needs.;Develop an individualized exercise prescription for aerobic and resistive training based on initial evaluation findings, risk stratification, comorbidities and participant's personal goals.   Expected Outcomes  Short Term: Increase workloads from initial exercise prescription for resistance, speed, and METs.;Short Term: Perform resistance training exercises routinely during rehab and add in resistance training at home;Long Term: Improve cardiorespiratory fitness, muscular endurance and strength as measured by increased METs and functional capacity (6MWT)   Able to understand and use rate of perceived exertion (RPE) scale  Yes   Intervention  Provide education and explanation on how to use RPE scale   Expected Outcomes  Short Term: Able to use RPE daily in rehab to express subjective intensity level;Long Term:  Able to use RPE to guide  intensity level when exercising independently   Knowledge and understanding of Target Heart Rate Range (THRR)  Yes   Intervention  Provide education and explanation of THRR including how the numbers were predicted and where they are located for reference   Expected Outcomes  Short Term: Able to state/look up THRR;Long Term: Able to use THRR to govern intensity when exercising independently;Short Term: Able to use daily as guideline for intensity in rehab   Understanding of Exercise Prescription  Yes   Intervention  Provide education, explanation, and written materials on patient's individual exercise prescription   Expected Outcomes  Short Term: Able to explain program exercise prescription;Long Term: Able to explain home exercise prescription to exercise independently  Exercise Goals Re-Evaluation: Exercise Goals Re-Evaluation    Exercise Goal Re-Evaluation    Row Name 01/29/18 1353 02/10/18 1351 03/13/18 1001 04/02/18 1519 04/16/18 1444   Exercise Goals Review  Understanding of Exercise Prescription;Knowledge and understanding of Target Heart Rate Range (THRR);Able to understand and use rate of perceived exertion (RPE) scale  Understanding of Exercise Prescription;Knowledge and understanding of Target Heart Rate Range (THRR);Able to understand and use rate of perceived exertion (RPE) scale;Increase Strength and Stamina;Increase Physical Activity  Increase Physical Activity;Increase Strength and Stamina;Able to understand and use rate of perceived exertion (RPE) scale;Knowledge and understanding of Target Heart Rate Range (THRR);Understanding of Exercise Prescription;Able to check pulse independently  Increase Physical Activity;Increase Strength and Stamina;Able to check pulse independently;Knowledge and understanding of Target Heart Rate Range (THRR);Able to understand and use rate of perceived exertion (RPE) scale;Understanding of Exercise Prescription  Increase Physical Activity;Able to  understand and use rate of perceived exertion (RPE) scale;Knowledge and understanding of Target Heart Rate Range (THRR);Understanding of Exercise Prescription;Able to check pulse independently;Increase Strength and Stamina   Comments  Reviewed home exercise guidelines with patient including THRR, RPE scale and endpoints for exercise. Pt is walking 15-20 minutes, 3 days/week as his mode of home exercise. Encouraged pt to increase duration to 30 minutes, and pt is amenable to this.  Patient is walking 15 minutes and using home exercise equipment 15 minutes on Tuesday, Thursday, and Saturday. Pt is also dancing as a mode of exercise.   Pt BP has been high with exercise. With rest BP becomes normal. Doctor was contacted.   Pt is tolerating exercise well. MET level has increased to 4.92. Pt has been taking new BEP med to help lower BP during exercise. New med is helping with exercise BP  Pt graduated cardiac rehab today. Pt plans to continue exercising at home on his own for 30-45 minutes per day.    Expected Outcomes  Patient will increase walking duration at home to 30 minutes to help improve cardiorespiratory fitness.  Patient will increase workloads to help achieve personal health and fitness goals.  Will continue to monitor Pt and BP. Continue exercise prescription.   Will continue to monitor and progress Pt as tolerated.   Pt will continue exercise prescription at home.           Nutrition & Weight - Outcomes: Pre Biometrics - 01/09/18 1144    Pre Biometrics          Height  5' 11.25" (1.81 m)    Weight  86.3 kg    Waist Circumference  37.5 inches    Hip Circumference  39 inches    Waist to Hip Ratio  0.96 %    BMI (Calculated)  26.34    Triceps Skinfold  13 mm    % Body Fat  25 %    Grip Strength  38 kg    Flexibility  8 in    Single Leg Stand  30 seconds          Post Biometrics - 03/28/18 1442     Post  Biometrics          Height  5' 11.25" (1.81 m)    Weight  89.4 kg    Waist  Circumference  38.5 inches    Hip Circumference  41.5 inches    Waist to Hip Ratio  0.93 %    BMI (Calculated)  27.29    Triceps Skinfold  14 mm    % Body Fat  26.1 %    Grip Strength  42 kg    Flexibility  9.5 in    Single Leg Stand  25.06 seconds           Nutrition: Nutrition Therapy & Goals - 04/18/18 0958    Nutrition Therapy          Diet  heart healthy        Intervention Plan          Intervention  Prescribe, educate and counsel regarding individualized specific dietary modifications aiming towards targeted core components such as weight, hypertension, lipid management, diabetes, heart failure and other comorbidities.    Expected Outcomes  Short Term Goal: Understand basic principles of dietary content, such as calories, fat, sodium, cholesterol and nutrients.;Long Term Goal: Adherence to prescribed nutrition plan.           Nutrition Discharge: Nutrition Assessments - 04/18/18 0958    MEDFICTS Scores          Pre Score  23    Post Score  3    Score Difference  -20           Education Questionnaire Score: Knowledge Questionnaire Score - 04/16/18 1430    Knowledge Questionnaire Score          Post Score  24/24           Goals reviewed with patient; copy given to patient.

## 2018-05-01 ENCOUNTER — Encounter: Payer: Self-pay | Admitting: Pharmacist

## 2018-05-01 ENCOUNTER — Ambulatory Visit (INDEPENDENT_AMBULATORY_CARE_PROVIDER_SITE_OTHER): Payer: Medicare Other | Admitting: Pharmacist

## 2018-05-01 VITALS — BP 140/82 | HR 50 | Ht 72.0 in | Wt 198.8 lb

## 2018-05-01 DIAGNOSIS — Z9861 Coronary angioplasty status: Secondary | ICD-10-CM

## 2018-05-01 DIAGNOSIS — I2510 Atherosclerotic heart disease of native coronary artery without angina pectoris: Secondary | ICD-10-CM | POA: Diagnosis not present

## 2018-05-01 DIAGNOSIS — I10 Essential (primary) hypertension: Secondary | ICD-10-CM | POA: Diagnosis not present

## 2018-05-01 MED ORDER — RAMIPRIL 10 MG PO CAPS: 10.0000 mg | ORAL_CAPSULE | Freq: Every day | ORAL | 1 refills | Status: DC

## 2018-05-01 NOTE — Progress Notes (Signed)
Thank you, ramipril it is! MCr

## 2018-05-01 NOTE — Progress Notes (Signed)
Patient ID: Kevin LatheCharles Buday                 DOB: 1950/06/21                      MRN: [id number]     HPI: Kevin Wade is a 67 y.o. male referred by Dr. Royann Shiversroitoru to HTN clinic. PMH includes unstable angina, stent-placement, hyperlipidemia, and hypertension.  During most recent office visit amlodipine 5mg  was started to improved BP control.  Patient presents to clinic accompany by spouse and reports feeling better. Denies increased dizziness, swelling, SOB, or fatigue. He completed cardiac rehab and continued to exercise regularly with his wife.   Patient expressed concern about recent report of lisinopril causing cancer and will like to change therapy. They were educated on recent recalls and safety of the drug , but the insist on changing therapy.  Current HTN meds:  Lisinopril 40mg  daily in AM Amlodipine 5mg  daily in HS   BP goal: 130/80  Family History: patient's family history includes Hypertension in his father  Social History: denies tobacco use, alcohol socially  Diet: working on low fat and low sodium since heart attack  Exercise: cardiac rehab completed, still wor  Home BP readings:   15 readings; average 142/84 (HR 50-75bpm)  Home monitor OMRON arm cuff - accurate within 10mmHg when compared with manual reading  Wt Readings from Last 3 Encounters:  05/01/18 198 lb 12.8 oz (90.2 kg)  03/28/18 197 lb 1.5 oz (89.4 kg)  03/06/18 196 lb 6.4 oz (89.1 kg)   BP Readings from Last 3 Encounters:  05/01/18 140/82  04/01/18 (!) 158/94  03/06/18 139/83   Pulse Readings from Last 3 Encounters:  05/01/18 (!) 50  04/01/18 (!) 58  03/06/18 (!) 55    Past Medical History:  Diagnosis Date  . Arthritis   . Hyperlipidemia   . Hypertension     Current Outpatient Medications on File Prior to Visit  Medication Sig Dispense Refill  . amLODipine (NORVASC) 5 MG tablet TAKE 1 TABLET BY MOUTH EVERYDAY AT BEDTIME 30 tablet 1  . aspirin EC 81 MG tablet Take 81 mg by mouth daily.    Marland Kitchen.  atorvastatin (LIPITOR) 80 MG tablet Take 1 tablet (80 mg total) by mouth daily at 6 PM. 30 tablet 0  . famotidine (PEPCID) 20 MG tablet Take 20 mg by mouth 2 (two) times daily.    . Misc Natural Products (OSTEO BI-FLEX JOINT SHIELD) TABS Take 1 tablet by mouth daily.    . Multiple Vitamins-Minerals (ONE-A-DAY MENS 50+ ADVANTAGE) TABS Take 1 tablet by mouth daily.    . nitroGLYCERIN (NITROSTAT) 0.4 MG SL tablet Place 1 tablet (0.4 mg total) under the tongue every 5 (five) minutes x 3 doses as needed for chest pain. (Patient not taking: Reported on 04/01/2018) 20 tablet 0  . Omega-3 1000 MG CAPS Take 1,000 mg by mouth daily.    . ticagrelor (BRILINTA) 90 MG TABS tablet Take 1 tablet (90 mg total) by mouth 2 (two) times daily. 180 tablet 1   No current facility-administered medications on file prior to visit.     No Known Allergies  Blood pressure 140/82, pulse (!) 50, height 6' (1.829 m), weight 198 lb 12.8 oz (90.2 kg).  Essential hypertension BP remains above goal but significantly improved since adding amlodipine to therapy. Now patient presents requesting stop on Lisinopril therapy due to "cancer risk". Will stop Lisinopril 40mg , start Ramipril 10mg  daily, and  continue amlodipine 5mg  daily. Plan to follow up in 5 weeks and increase amlodipine to 10mg  if additional BP control needed. Noted patient baseline HR is 50-60bpm; therefore will avoid BB therapy.   Jaree Dwight Rodriguez-Guzman PharmD, BCPS, CPP [facility] [address] [place], [phone] 05/01/2018 4:22 PM

## 2018-05-01 NOTE — Patient Instructions (Signed)
Return for a  follow up appointment in 5 WEEKS  Check your blood pressure at home daily (if able) and keep record of the readings.  Take your BP meds as follows: STOP taking lisinopril START taking ramipril 10mg  daily  CONTINUE all other medication as prescribed  Bring all of your meds, your BP cuff and your record of home blood pressures to your next appointment.  Exercise as you're able, try to walk approximately 30 minutes per day.  Keep salt intake to a minimum, especially watch canned and prepared boxed foods.  Eat more fresh fruits and vegetables and fewer canned items.  Avoid eating in fast food restaurants.    HOW TO TAKE YOUR BLOOD PRESSURE: . Rest 5 minutes before taking your blood pressure. .  Don't smoke or drink caffeinated beverages for at least 30 minutes before. . Take your blood pressure before (not after) you eat. . Sit comfortably with your back supported and both feet on the floor (don't cross your legs). . Elevate your arm to heart level on a table or a desk. . Use the proper sized cuff. It should fit smoothly and snugly around your bare upper arm. There should be enough room to slip a fingertip under the cuff. The bottom edge of the cuff should be 1 inch above the crease of the elbow. . Ideally, take 3 measurements at one sitting and record the average.

## 2018-05-01 NOTE — Assessment & Plan Note (Signed)
BP remains above goal but significantly improved since adding amlodipine to therapy. Now patient presents requesting stop on Lisinopril therapy due to "cancer risk". Will stop Lisinopril 40mg , start Ramipril 10mg  daily, and continue amlodipine 5mg  daily. Plan to follow up in 5 weeks and increase amlodipine to 10mg  if additional BP control needed. Noted patient baseline HR is 50-60bpm; therefore will avoid BB therapy.

## 2018-05-12 ENCOUNTER — Other Ambulatory Visit: Payer: Self-pay | Admitting: Cardiovascular Disease

## 2018-05-19 ENCOUNTER — Other Ambulatory Visit: Payer: Self-pay | Admitting: Cardiovascular Disease

## 2018-05-20 NOTE — Telephone Encounter (Signed)
Rx request sent to pharmacy.  

## 2018-05-21 ENCOUNTER — Telehealth: Payer: Self-pay | Admitting: Cardiovascular Disease

## 2018-05-21 NOTE — Telephone Encounter (Signed)
New Message   Pt c/o medication issue:  1. Name of Medication: Tylenol 3  2. How are you currently taking this medication (dosage and times per day)? 1 every 6 hours as needed  3. Are you having a reaction (difficulty breathing--STAT)? n/a  4. What is your medication issue? Patient had a tooth pulled and prescribed Tylenol 3 and wants to make sure it will be ok to take

## 2018-05-21 NOTE — Telephone Encounter (Signed)
Spoke with pt's wife and informed that pt would be ok to take tylenol. Verbalized understanding.

## 2018-05-25 ENCOUNTER — Other Ambulatory Visit: Payer: Self-pay | Admitting: Cardiovascular Disease

## 2018-06-09 ENCOUNTER — Ambulatory Visit: Payer: Medicare Other

## 2018-06-09 NOTE — Progress Notes (Deleted)
Patient ID: Kevin Wade                 DOB: 1951/02/20                      MRN: [id number]     HPI: Kevin Wade is a 68 y.o. male referred by Dr. Royann Shivers to HTN clinic. PMH includes unstable angina, stent-placement, hyperlipidemia, and hypertension.  During most recent office visit amlodipine 5mg  was started to improved BP control.  Patient presents to clinic accompany by spouse and reports feeling better. Denies increased dizziness, swelling, SOB, or fatigue. He completed cardiac rehab and continued to exercise regularly with his wife.   At his last visit patient expressed concern about recent report of lisinopril causing cancer and will like to change therapy. They were educated on recent recalls and safety of the drug , but the insist on changing therapy.  He was switched to ramipril 10 mg once daily.    Today he returns for follow up.     Current HTN meds:  Lisinopril 40mg  daily in AM Amlodipine 5mg  daily in HS   BP goal: 130/80  Family History: patient's family history includes Hypertension in his father  Social History: denies tobacco use, alcohol socially  Diet: working on low fat and low sodium since heart attack  Exercise: cardiac rehab completed, still wor  Home BP readings:   15 readings; average 142/84 (HR 50-75bpm)  Home monitor OMRON arm cuff - accurate within when compared with manual reading  Wt Readings from Last 3 Encounters:  05/01/18 198 lb 12.8 oz (90.2 kg)  03/28/18 197 lb 1.5 oz (89.4 kg)  03/06/18 196 lb 6.4 oz (89.1 kg)   BP Readings from Last 3 Encounters:  05/01/18 140/82  04/01/18 (!) 158/94  03/06/18 139/83   Pulse Readings from Last 3 Encounters:  05/01/18 (!) 50  04/01/18 (!) 58  03/06/18 (!) 55    Past Medical History:  Diagnosis Date  . Arthritis   . Hyperlipidemia   . Hypertension     Current Outpatient Medications on File Prior to Visit  Medication Sig Dispense Refill  . amLODipine (NORVASC) 5 MG tablet TAKE 1  TABLET BY MOUTH EVERYDAY AT BEDTIME 30 tablet 1  . aspirin EC 81 MG tablet Take 81 mg by mouth daily.    Marland Kitchen atorvastatin (LIPITOR) 80 MG tablet Take 1 tablet (80 mg total) by mouth daily at 6 PM. 30 tablet 0  . BRILINTA 90 MG TABS tablet TAKE 1 TABLET BY MOUTH TWICE DAILY 180 tablet 3  . famotidine (PEPCID) 20 MG tablet Take 20 mg by mouth 2 (two) times daily.    . Misc Natural Products (OSTEO BI-FLEX JOINT SHIELD) TABS Take 1 tablet by mouth daily.    . Multiple Vitamins-Minerals (ONE-A-DAY MENS 50+ ADVANTAGE) TABS Take 1 tablet by mouth daily.    . nitroGLYCERIN (NITROSTAT) 0.4 MG SL tablet Place 1 tablet (0.4 mg total) under the tongue every 5 (five) minutes x 3 doses as needed for chest pain. (Patient not taking: Reported on 04/01/2018) 20 tablet 0  . Omega-3 1000 MG CAPS Take 1,000 mg by mouth daily.    . ramipril (ALTACE) 10 MG capsule TAKE 1 CAPSULE BY MOUTH EVERY DAY 30 capsule 6   No current facility-administered medications on file prior to visit.     No Known Allergies  There were no vitals taken for this visit.  No problem-specific Assessment & Plan notes found  for this encounter.   Raquel Rodriguez-Guzman PharmD, BCPS, CPP [facility] [address] [place] [address] 06/09/2018 10:41 AM

## 2018-06-12 ENCOUNTER — Other Ambulatory Visit: Payer: Self-pay | Admitting: Cardiovascular Disease

## 2018-06-13 ENCOUNTER — Encounter: Payer: Self-pay | Admitting: Pharmacist

## 2018-06-13 ENCOUNTER — Ambulatory Visit (INDEPENDENT_AMBULATORY_CARE_PROVIDER_SITE_OTHER): Payer: Medicare Other | Admitting: Pharmacist

## 2018-06-13 VITALS — BP 130/78 | HR 60 | Wt 204.4 lb

## 2018-06-13 DIAGNOSIS — I10 Essential (primary) hypertension: Secondary | ICD-10-CM

## 2018-06-13 NOTE — Patient Instructions (Signed)
Return for a follow up appointment in as needed  Check your blood pressure at home daily (if able) and keep record of the readings.  Take your BP meds as follows: *No change in medication*  Bring all of your meds, your BP cuff and your record of home blood pressures to your next appointment.  Exercise as you're able, try to walk approximately 30 minutes per day.  Keep salt intake to a minimum, especially watch canned and prepared boxed foods.  Eat more fresh fruits and vegetables and fewer canned items.  Avoid eating in fast food restaurants.    HOW TO TAKE YOUR BLOOD PRESSURE: . Rest 5 minutes before taking your blood pressure. .  Don't smoke or drink caffeinated beverages for at least 30 minutes before. . Take your blood pressure before (not after) you eat. . Sit comfortably with your back supported and both feet on the floor (don't cross your legs). . Elevate your arm to heart level on a table or a desk. . Use the proper sized cuff. It should fit smoothly and snugly around your bare upper arm. There should be enough room to slip a fingertip under the cuff. The bottom edge of the cuff should be 1 inch above the crease of the elbow. . Ideally, take 3 measurements at one sitting and record the average.

## 2018-06-13 NOTE — Progress Notes (Signed)
Patient ID: Kevin Wade                 DOB: 1950-07-26                      MRN: [id number]     HPI:  Kevin Wade is a 68 y.o. male referred by Dr. Royann Shivers to HTN clinic. PMH includes unstable angina, stent-placement, hyperlipidemia, and hypertension.  During most recent office started to improved BP control.  We changed his lisinopril to ramipril during last office per patient request. He denies increased dizziness, swelling, SOB, or fatigue.Continues to exercise regularly with his wife and "feeling great" since we adjusted his medication.   Current HTN meds:  Ramipril 10mg  daily Amlodipine 5mg  daily in HS   BP goal: 130/80  Family History: patient's family history includes Hypertension in his father  Social History: denies tobacco use, alcohol socially  Diet: working on low fat and low sodium since heart attack  Exercise: cardiac rehab completed, still wor  Home BP readings:  None provided today; 120s to 130s systolic per patient recollection.   Home monitor OMRON arm cuff - accurate within when compared with manual reading  Wt Readings from Last 3 Encounters:  06/13/18 204 lb 6.4 oz (92.7 kg)  05/01/18 198 lb 12.8 oz (90.2 kg)  03/28/18 197 lb 1.5 oz (89.4 kg)   BP Readings from Last 3 Encounters:  06/13/18 130/78  05/01/18 140/82  04/01/18 (!) 158/94   Pulse Readings from Last 3 Encounters:  06/13/18 60  05/01/18 (!) 50  04/01/18 (!) 58    Past Medical History:  Diagnosis Date  . Arthritis   . Hyperlipidemia   . Hypertension     Current Outpatient Medications on File Prior to Visit  Medication Sig Dispense Refill  . amLODipine (NORVASC) 5 MG tablet TAKE 1 TABLET BY MOUTH EVERYDAY AT BEDTIME 90 tablet 0  . aspirin EC 81 MG tablet Take 81 mg by mouth daily.    Marland Kitchen atorvastatin (LIPITOR) 80 MG tablet Take 1 tablet (80 mg total) by mouth daily at 6 PM. 30 tablet 0  . BRILINTA 90 MG TABS tablet TAKE 1 TABLET BY MOUTH TWICE DAILY 180 tablet 3  .  famotidine (PEPCID) 20 MG tablet Take 20 mg by mouth 2 (two) times daily.    . Misc Natural Products (OSTEO BI-FLEX JOINT SHIELD) TABS Take 1 tablet by mouth daily.    . Multiple Vitamins-Minerals (ONE-A-DAY MENS 50+ ADVANTAGE) TABS Take 1 tablet by mouth daily.    . nitroGLYCERIN (NITROSTAT) 0.4 MG SL tablet Place 1 tablet (0.4 mg total) under the tongue every 5 (five) minutes x 3 doses as needed for chest pain. (Patient not taking: Reported on 04/01/2018) 20 tablet 0  . Omega-3 1000 MG CAPS Take 1,000 mg by mouth daily.    . ramipril (ALTACE) 10 MG capsule TAKE 1 CAPSULE BY MOUTH EVERY DAY 30 capsule 6   No current facility-administered medications on file prior to visit.     No Known Allergies  Blood pressure 130/78, pulse 60, weight 204 lb 6.4 oz (92.7 kg).  Essential hypertension BP at goal and patient tolerating therapy very well. Will continue all medication as previously prescribed and follow up as needed.    Diane Hanel Rodriguez-Guzman PharmD, BCPS, CPP [facility] [address] [place] [address] 06/13/2018 5:00 PM

## 2018-06-13 NOTE — Assessment & Plan Note (Signed)
BP at goal and patient tolerating therapy very well. Will continue all medication as previously prescribed and follow up as needed.

## 2018-06-14 NOTE — Progress Notes (Signed)
Thank you MCr 

## 2018-07-24 DIAGNOSIS — J069 Acute upper respiratory infection, unspecified: Secondary | ICD-10-CM | POA: Diagnosis not present

## 2018-09-05 DIAGNOSIS — E785 Hyperlipidemia, unspecified: Secondary | ICD-10-CM | POA: Diagnosis not present

## 2018-09-05 DIAGNOSIS — R350 Frequency of micturition: Secondary | ICD-10-CM | POA: Diagnosis not present

## 2018-09-05 DIAGNOSIS — I2510 Atherosclerotic heart disease of native coronary artery without angina pectoris: Secondary | ICD-10-CM | POA: Diagnosis not present

## 2018-09-05 DIAGNOSIS — Z5181 Encounter for therapeutic drug level monitoring: Secondary | ICD-10-CM | POA: Diagnosis not present

## 2018-09-05 DIAGNOSIS — I10 Essential (primary) hypertension: Secondary | ICD-10-CM | POA: Diagnosis not present

## 2018-09-05 DIAGNOSIS — K5900 Constipation, unspecified: Secondary | ICD-10-CM | POA: Diagnosis not present

## 2018-09-22 ENCOUNTER — Other Ambulatory Visit: Payer: Self-pay | Admitting: Cardiovascular Disease

## 2018-09-22 NOTE — Telephone Encounter (Signed)
Amlodipine 5 mg refilled. 

## 2018-11-20 ENCOUNTER — Other Ambulatory Visit: Payer: Self-pay | Admitting: Cardiovascular Disease

## 2018-11-26 ENCOUNTER — Telehealth: Payer: Self-pay | Admitting: *Deleted

## 2018-11-26 NOTE — Telephone Encounter (Signed)
Virtual Visit Pre-Appointment Phone Call  "(Name), I am calling you today to discuss your upcoming appointment. We are currently trying to limit exposure to the virus that causes COVID-19 by seeing patients at home rather than in the office."  1. "What is the BEST phone number to call the day of the visit?" - include this in appointment notes  2. "Do you have or have access to (through a family member/friend) a smartphone with video capability that we can use for your visit?" a. If yes - list this number in appt notes as "cell" (if different from BEST phone #) and list the appointment type as a VIDEO visit in appointment notes b. If no - list the appointment type as a PHONE visit in appointment notes  Confirm consent - "In the setting of the current Covid19 crisis, you are scheduled for a (phone or video) visit with your provider on (date) at (time).  Just as we do with many in-office visits, in order for you to participate in this visit, we must obtain consent.  If you'd like, I can send this to your mychart (if signed up) or email for you to review.  Otherwise, I can obtain your verbal consent now.  All virtual visits are billed to your insurance company just like a normal visit would be.  By agreeing to a virtual visit, we'd like you to understand that the technology does not allow for your provider to perform an examination, and thus may limit your provider's ability to fully assess your condition. If your provider identifies any concerns that need to be evaluated in person, we will make arrangements to do so.  Finally, though the technology is pretty good, we cannot assure that it will always work on either your or our end, and in the setting of a video visit, we may have to convert it to a phone-only visit.  In either situation, we cannot ensure that we have a secure connection.  Are you willing to proceed?" YES 3. Advise patient to be prepared - "Two hours prior to your appointment, go ahead  and check your blood pressure, pulse, oxygen saturation, and your weight (if you have the equipment to check those) and write them all down. When your visit starts, your provider will ask you for this information. If you have an Apple Watch or Kardia device, please plan to have heart rate information ready on the day of your appointment. Please have a pen and paper handy nearby the day of the visit as well."  4. Give patient instructions for MyChart download to smartphone OR Doximity/Doxy.me as below if video visit (depending on what platform provider is using)  5. Inform patient they will receive a phone call 15 minutes prior to their appointment time (may be from unknown caller ID) so they should be prepared to answer    TELEPHONE CALL NOTE  Tequan Redmon has been deemed a candidate for a follow-up tele-health visit to limit community exposure during the Covid-19 pandemic. I spoke with the patient via phone to ensure availability of phone/video source, confirm preferred email & phone number, and discuss instructions and expectations.  I reminded Yaqub Arney to be prepared with any vital sign and/or heart rhythm information that could potentially be obtained via home monitoring, at the time of his visit. I reminded Myrl Lazarus to expect a phone call prior to his visit.  Ricci Barker, RN 11/26/2018 3:22 PM   INSTRUCTIONS FOR DOWNLOADING THE [place] APP  TO SMARTPHONE  - The patient must first make sure to have activated MyChart and know their login information - If Apple, go to [employer] and type in MyChart in the search bar and download the app. If Android, ask patient to go to [employer] and type in [place] in the search bar and download the app. The app is free but as with any other app downloads, their phone may require them to verify saved payment information or Apple/Android password.  - The patient will need to then log into the app with their MyChart username and  password, and select [place] as their healthcare provider to link the account. When it is time for your visit, go to the MyChart app, find appointments, and click Begin Video Visit. Be sure to Select Allow for your device to access the Microphone and Camera for your visit. You will then be connected, and your provider will be with you shortly.  **If they have any issues connecting, or need assistance please contact MyChart service desk [phone])**  **If using a computer, in order to ensure the best quality for their visit they will need to use either of the following Internet Browsers: [employer], or Google Chrome**  IF USING DOXIMITY or DOXY.ME - The patient will receive a link just prior to their visit by text.     FULL LENGTH CONSENT FOR TELE-HEALTH VISIT   I hereby voluntarily request, consent and authorize [facility] and its employed or contracted physicians, physician assistants, nurse practitioners or other licensed health care professionals (the Practitioner), to provide me with telemedicine health care services (the "Services") as deemed necessary by the treating Practitioner. I acknowledge and consent to receive the Services by the Practitioner via telemedicine. I understand that the telemedicine visit will involve communicating with the Practitioner through live audiovisual communication technology and the disclosure of certain medical information by electronic transmission. I acknowledge that I have been given the opportunity to request an in-person assessment or other available alternative prior to the telemedicine visit and am voluntarily participating in the telemedicine visit.  I understand that I have the right to withhold or withdraw my consent to the use of telemedicine in the course of my care at any time, without affecting my right to future care or treatment, and that the Practitioner or I may terminate the telemedicine visit at any time. I  understand that I have the right to inspect all information obtained and/or recorded in the course of the telemedicine visit and may receive copies of available information for a reasonable fee.  I understand that some of the potential risks of receiving the Services via telemedicine include:  Marland Kitchen Delay or interruption in medical evaluation due to technological equipment failure or disruption; . Information transmitted may not be sufficient (e.g. poor resolution of images) to allow for appropriate medical decision making by the Practitioner; and/or  . In rare instances, security protocols could fail, causing a breach of personal health information.  Furthermore, I acknowledge that it is my responsibility to provide information about my medical history, conditions and care that is complete and accurate to the best of my ability. I acknowledge that Practitioner's advice, recommendations, and/or decision may be based on factors not within their control, such as incomplete or inaccurate data provided by me or distortions of diagnostic images or specimens that may result from electronic transmissions. I understand that the practice of medicine is not an exact science and that Practitioner makes no warranties  or guarantees regarding treatment outcomes. I acknowledge that I will receive a copy of this consent concurrently upon execution via email to the email address I last provided but may also request a printed copy by calling the office of [facility].    I understand that my insurance will be billed for this visit.   I have read or had this consent read to me. . I understand the contents of this consent, which adequately explains the benefits and risks of the Services being provided via telemedicine.  . I have been provided ample opportunity to ask questions regarding this consent and the Services and have had my questions answered to my satisfaction. . I give my informed consent for the services to be  provided through the use of telemedicine in my medical care  By participating in this telemedicine visit I agree to the above.

## 2018-11-27 ENCOUNTER — Telehealth (INDEPENDENT_AMBULATORY_CARE_PROVIDER_SITE_OTHER): Payer: Medicare Other | Admitting: Cardiovascular Disease

## 2018-11-27 VITALS — BP 127/76 | HR 55 | Ht 72.0 in | Wt 186.0 lb

## 2018-11-27 DIAGNOSIS — Z9861 Coronary angioplasty status: Secondary | ICD-10-CM

## 2018-11-27 DIAGNOSIS — I2510 Atherosclerotic heart disease of native coronary artery without angina pectoris: Secondary | ICD-10-CM

## 2018-11-27 DIAGNOSIS — I10 Essential (primary) hypertension: Secondary | ICD-10-CM

## 2018-11-27 DIAGNOSIS — E7800 Pure hypercholesterolemia, unspecified: Secondary | ICD-10-CM

## 2018-11-27 NOTE — Patient Instructions (Signed)
Medication Instructions:  STOP the Brilinta STOP the Famotidine  If you need a refill on your cardiac medications before your next appointment, please call your pharmacy.   Lab work: Your provider would like for you to return within the next few weeks to have the following labs drawn: Fasting lipid and CMET. You do not need an appointment for the lab. Once in our office lobby there is a podium where you can sign in and ring the doorbell to alert [place] that you are here. The lab is open from 8:00 am to 4:30 pm; closed for lunch from 12:45pm-1:45pm.  If you have labs (blood work) drawn today and your tests are completely normal, you will receive your results only by: Marland Kitchen MyChart Message (if you have MyChart) OR . A paper copy in the mail If you have any lab test that is abnormal or we need to change your treatment, we will call you to review the results.  Testing/Procedures: None ordered  Follow-Up: At [facility], you and your health needs are our priority.  As part of our continuing mission to provide you with exceptional heart care, we have created designated Provider Care Teams.  These Care Teams include your primary Cardiologist (physician) and Advanced Practice Providers (APPs -  Physician Assistants and Nurse Practitioners) who all work together to provide you with the care you need, when you need it. You will need a follow up appointment in 12 months.  Please call our office 2 months in advance to schedule this appointment.  You may see Sanda Klein, MD or one of the following Advanced Practice Providers on your designated Care Team: [place], [place] . Fabian Sharp, PA-C

## 2018-11-27 NOTE — Progress Notes (Signed)
Virtual Visit via Video Note   This visit type was conducted due to national recommendations for restrictions regarding the COVID-19 Pandemic (e.g. social distancing) in an effort to limit this patient's exposure and mitigate transmission in our community.  Due to his co-morbid illnesses, this patient is at least at moderate risk for complications without adequate follow up.  This format is felt to be most appropriate for this patient at this time.  All issues noted in this document were discussed and addressed.  A limited physical exam was performed with this format.  Please refer to the patient's chart for his consent to telehealth for [facility].   Date:  11/27/2018   ID:  Fredirick Latheharles Guinn, DOB 10-04-1950, MRN [id number]  Patient Location: Home Provider Location: Home  PCP:  Yolanda MangesWilson, Alex M, DO  Cardiologist:  Thurmon FairMihai Karynn Deblasi, MD  Electrophysiologist:  None   Evaluation Performed:  Follow-Up Visit  Chief Complaint:  CAD  History of Present Illness:    Fredirick LatheCharles Neilan is a 68 y.o. male with a history of hypertension hyperlipidemia presenting with a very small non-STEMI on November 13, 2017 followed by placement of a drug-eluting stent for a high-grade stenosis in the mid right coronary artery.  Also had a moderate 40% stenosis in the left circumflex coronary artery but has preserved left ventricular systolic function.  He has done well since that time, completed cardiac rehab and is an active person without complaints of angina or dyspnea with activity.  He has had some trouble with random episodes of dyspnea while taking Brilinta, relieved with caffeine.  He also has easy bruising.  Denies edema, syncope, intermittent claudication or focal neurological complaints.  He has lost some weight and is now had an ideal BMI of 25.  The patient does not have symptoms concerning for COVID-19 infection (fever, chills, cough, or new shortness of breath).    Past Medical History:  Diagnosis Date  .  Arthritis   . Hyperlipidemia   . Hypertension    Past Surgical History:  Procedure Laterality Date  . APPENDECTOMY    . CORONARY STENT INTERVENTION N/A 11/13/2017   Procedure: CORONARY STENT INTERVENTION;  Surgeon: Tonny Bollmanooper, Michael, MD;  [facility];  Service: Cardiovascular;  Laterality: N/A;  . LEFT HEART CATH AND CORONARY ANGIOGRAPHY N/A 11/13/2017   Procedure: LEFT HEART CATH AND CORONARY ANGIOGRAPHY;  Surgeon: Tonny Bollmanooper, Michael, MD;  [facility];  Service: Cardiovascular;  Laterality: N/A;     Current Meds  Medication Sig  . amLODipine (NORVASC) 5 MG tablet TAKE 1 TABLET BY MOUTH EVERYDAY AT BEDTIME  . aspirin EC 81 MG tablet Take 81 mg by mouth daily.  Marland Kitchen. atorvastatin (LIPITOR) 80 MG tablet Take 1 tablet (80 mg total) by mouth daily at 6 PM.  . BRILINTA 90 MG TABS tablet TAKE 1 TABLET BY MOUTH TWICE DAILY  . famotidine (PEPCID) 20 MG tablet Take 20 mg by mouth 2 (two) times daily.  . Misc Natural Products (OSTEO BI-FLEX JOINT SHIELD) TABS Take 1 tablet by mouth daily.  . Multiple Vitamins-Minerals (ONE-A-DAY MENS 50+ ADVANTAGE) TABS Take 1 tablet by mouth daily.  . nitroGLYCERIN (NITROSTAT) 0.4 MG SL tablet Place 1 tablet (0.4 mg total) under the tongue every 5 (five) minutes x 3 doses as needed for chest pain.  . Omega-3 1000 MG CAPS Take 1,000 mg by mouth daily.  . ramipril (ALTACE) 10 MG capsule TAKE 1 CAPSULE BY MOUTH EVERY DAY     Allergies:  Patient has no known allergies.   Social History   Tobacco Use  . Smoking status: Never Smoker  . Smokeless tobacco: Never Used  Substance Use Topics  . Alcohol use: Yes    Comment: Socially   . Drug use: No     Family Hx: The patient's family history includes Hypertension in his father.  ROS:   Please see the history of present illness.     All other systems reviewed and are negative.   Prior CV studies:   The following studies were reviewed today:  Coronary angiography 10/10/2017   Labs/Other Tests and Data Reviewed:    EKG:  An ECG dated 11/22/2017 was personally reviewed today and demonstrated:  Sinus bradycardia, diffuse ST segment elevation with J-point elevation consistent with early repolarization  Recent Labs: No results found for requested labs within last 8760 hours.   Recent Lipid Panel Lab Results  Component Value Date/Time   CHOL 143 11/14/2017 03:22 AM   TRIG 82 11/14/2017 03:22 AM   HDL 47 11/14/2017 03:22 AM   CHOLHDL 3.0 11/14/2017 03:22 AM   LDLCALC 80 11/14/2017 03:22 AM    Wt Readings from Last 3 Encounters:  11/27/18 186 lb (84.4 kg)  06/13/18 204 lb 6.4 oz (92.7 kg)  05/01/18 198 lb 12.8 oz (90.2 kg)     Objective:    Vital Signs:  BP 127/76 Comment: Second reading.  Pulse (!) 55   Ht 6' (1.829 m)   Wt 186 lb (84.4 kg)   BMI 25.23 kg/m    VITAL SIGNS:  reviewed GEN:  no acute distress EYES:  sclerae anicteric, EOMI - Extraocular Movements Intact RESPIRATORY:  normal respiratory effort, symmetric expansion CARDIOVASCULAR:  no peripheral edema SKIN:  no rash, lesions or ulcers. MUSCULOSKELETAL:  no obvious deformities. NEURO:  alert and oriented x 3, no obvious focal deficit PSYCH:  normal affect  ASSESSMENT & PLAN:    1. CAD: Now 1 year status post tiny non-STEMI.  Asymptomatic.  Stop Brilinta and continue lifelong aspirin 81 mg daily.  Continue statin.  Not a candidate for beta-blockers due to underlying sinus bradycardia.  2. HTN: Well-controlled, continue ramipril and amlodipine.  3. HLP: On atorvastatin.  Repeat lipid profile.  COVID-19 Education: The signs and symptoms of COVID-19 were discussed with the patient and how to seek care for testing (follow up with PCP or arrange E-visit).  The importance of social distancing was discussed today.  Time:   Today, I have spent 17 minutes with the patient with telehealth technology discussing the above problems.     Medication Adjustments/Labs and Tests Ordered: Current  medicines are reviewed at length with the patient today.  Concerns regarding medicines are outlined above.   Tests Ordered: No orders of the defined types were placed in this encounter.   Medication Changes: No orders of the defined types were placed in this encounter.   Follow Up:  Virtual Visit or In Person 12 months  Signed, Sanda Klein, MD  11/27/2018 11:33 AM    [place]

## 2019-03-03 DIAGNOSIS — Z0000 Encounter for general adult medical examination without abnormal findings: Secondary | ICD-10-CM | POA: Diagnosis not present

## 2019-03-12 DIAGNOSIS — Z23 Encounter for immunization: Secondary | ICD-10-CM | POA: Diagnosis not present

## 2019-03-12 DIAGNOSIS — E785 Hyperlipidemia, unspecified: Secondary | ICD-10-CM | POA: Diagnosis not present

## 2019-03-12 DIAGNOSIS — Z5181 Encounter for therapeutic drug level monitoring: Secondary | ICD-10-CM | POA: Diagnosis not present

## 2019-03-12 DIAGNOSIS — I2510 Atherosclerotic heart disease of native coronary artery without angina pectoris: Secondary | ICD-10-CM | POA: Diagnosis not present

## 2019-03-12 DIAGNOSIS — R350 Frequency of micturition: Secondary | ICD-10-CM | POA: Diagnosis not present

## 2019-04-15 ENCOUNTER — Encounter: Payer: Self-pay | Admitting: *Deleted

## 2019-04-15 ENCOUNTER — Other Ambulatory Visit: Payer: Self-pay | Admitting: *Deleted

## 2019-04-15 DIAGNOSIS — E7800 Pure hypercholesterolemia, unspecified: Secondary | ICD-10-CM

## 2019-04-15 DIAGNOSIS — I10 Essential (primary) hypertension: Secondary | ICD-10-CM

## 2019-04-15 DIAGNOSIS — I2510 Atherosclerotic heart disease of native coronary artery without angina pectoris: Secondary | ICD-10-CM

## 2019-05-11 DIAGNOSIS — I10 Essential (primary) hypertension: Secondary | ICD-10-CM | POA: Diagnosis not present

## 2019-05-11 DIAGNOSIS — E7800 Pure hypercholesterolemia, unspecified: Secondary | ICD-10-CM | POA: Diagnosis not present

## 2019-05-11 DIAGNOSIS — I2510 Atherosclerotic heart disease of native coronary artery without angina pectoris: Secondary | ICD-10-CM | POA: Diagnosis not present

## 2019-05-11 LAB — COMPREHENSIVE METABOLIC PANEL
ALT: 36 IU/L (ref 0–44)
AST: 27 IU/L (ref 0–40)
Albumin/Globulin Ratio: 1.9 (ref 1.2–2.2)
Albumin: 4.8 g/dL (ref 3.8–4.8)
Alkaline Phosphatase: 83 IU/L (ref 39–117)
BUN/Creatinine Ratio: 14 (ref 10–24)
BUN: 16 mg/dL (ref 8–27)
Bilirubin Total: 0.7 mg/dL (ref 0.0–1.2)
CO2: 21 mmol/L (ref 20–29)
Calcium: 9.9 mg/dL (ref 8.6–10.2)
Chloride: 105 mmol/L (ref 96–106)
Creatinine, Ser: 1.14 mg/dL (ref 0.76–1.27)
GFR calc Af Amer: 75 mL/min/{1.73_m2} (ref >59)
GFR calc non Af Amer: 65 mL/min/{1.73_m2} (ref >59)
Globulin, Total: 2.5 g/dL (ref 1.5–4.5)
Glucose: 87 mg/dL (ref 65–99)
Potassium: 4.4 mmol/L (ref 3.5–5.2)
Sodium: 139 mmol/L (ref 134–144)
Total Protein: 7.3 g/dL (ref 6.0–8.5)

## 2019-05-11 LAB — LIPID PANEL
Chol/HDL Ratio: 2.6 ratio (ref 0.0–5.0)
Cholesterol, Total: 163 mg/dL (ref 100–199)
HDL: 63 mg/dL (ref >39)
LDL Chol Calc (NIH): 86 mg/dL (ref 0–99)
Triglycerides: 72 mg/dL (ref 0–149)
VLDL Cholesterol Cal: 14 mg/dL (ref 5–40)

## 2019-05-13 ENCOUNTER — Telehealth: Payer: Self-pay | Admitting: Cardiovascular Disease

## 2019-05-13 ENCOUNTER — Encounter: Payer: Self-pay | Admitting: Cardiovascular Disease

## 2019-05-13 ENCOUNTER — Other Ambulatory Visit: Payer: Self-pay | Admitting: *Deleted

## 2019-05-13 DIAGNOSIS — E7800 Pure hypercholesterolemia, unspecified: Secondary | ICD-10-CM

## 2019-05-13 MED ORDER — EZETIMIBE 10 MG PO TABS: 10.0000 mg | ORAL_TABLET | Freq: Every day | ORAL | 3 refills | Status: DC

## 2019-05-13 NOTE — Telephone Encounter (Signed)
Left a message for the patient to call back.  

## 2019-05-13 NOTE — Telephone Encounter (Signed)
Error

## 2019-05-13 NOTE — Telephone Encounter (Signed)
Patient's wife called stating she spoke to [facility], and forgot to give her a recent medication change. She would like to speak to nurse.

## 2019-05-14 ENCOUNTER — Other Ambulatory Visit: Payer: Self-pay | Admitting: Cardiovascular Disease

## 2019-05-14 NOTE — Telephone Encounter (Signed)
Wife of the patient returning your call

## 2019-05-14 NOTE — Telephone Encounter (Signed)
Spoke with the patient's wife. She was calling to inform Dr. Sallyanne Kuster that the patient's PCP had stopped the atorvastatin a few months ago (she was unsure of the exact time frame) and put the patient on Rosuvastatin 40 mg once daily.   He had been on the Rosuvastatin 40 mg when he had his recent lipid lab completed.  Zetia 10 mg once daily was recently started by Dr. Sallyanne Kuster due to his LDL not being at goal.

## 2019-05-14 NOTE — Telephone Encounter (Signed)
Rx request sent to pharmacy.  

## 2019-05-14 NOTE — Telephone Encounter (Signed)
The patient's wife has been made aware. The chart has been updated.

## 2019-05-14 NOTE — Telephone Encounter (Signed)
Yes stay on rosuvastatin. It is even stronger than atorvastatin, but just not enough. Will need zetia also.

## 2019-06-19 DIAGNOSIS — K5900 Constipation, unspecified: Secondary | ICD-10-CM | POA: Diagnosis not present

## 2019-06-19 DIAGNOSIS — Z0000 Encounter for general adult medical examination without abnormal findings: Secondary | ICD-10-CM | POA: Diagnosis not present

## 2019-06-19 DIAGNOSIS — E785 Hyperlipidemia, unspecified: Secondary | ICD-10-CM | POA: Diagnosis not present

## 2019-06-19 DIAGNOSIS — I2510 Atherosclerotic heart disease of native coronary artery without angina pectoris: Secondary | ICD-10-CM | POA: Diagnosis not present

## 2019-06-19 DIAGNOSIS — D696 Thrombocytopenia, unspecified: Secondary | ICD-10-CM | POA: Diagnosis not present

## 2019-06-19 DIAGNOSIS — I10 Essential (primary) hypertension: Secondary | ICD-10-CM | POA: Diagnosis not present

## 2019-06-19 DIAGNOSIS — Z5181 Encounter for therapeutic drug level monitoring: Secondary | ICD-10-CM | POA: Diagnosis not present

## 2019-08-09 ENCOUNTER — Other Ambulatory Visit: Payer: Self-pay | Admitting: Cardiovascular Disease

## 2019-11-13 ENCOUNTER — Other Ambulatory Visit: Payer: Self-pay | Admitting: Cardiovascular Disease

## 2019-12-04 DIAGNOSIS — D649 Anemia, unspecified: Secondary | ICD-10-CM | POA: Diagnosis not present

## 2019-12-04 DIAGNOSIS — M549 Dorsalgia, unspecified: Secondary | ICD-10-CM | POA: Diagnosis not present

## 2019-12-04 DIAGNOSIS — E611 Iron deficiency: Secondary | ICD-10-CM | POA: Diagnosis not present

## 2019-12-04 DIAGNOSIS — D696 Thrombocytopenia, unspecified: Secondary | ICD-10-CM | POA: Diagnosis not present

## 2019-12-04 DIAGNOSIS — I2510 Atherosclerotic heart disease of native coronary artery without angina pectoris: Secondary | ICD-10-CM | POA: Diagnosis not present

## 2019-12-04 DIAGNOSIS — Z5181 Encounter for therapeutic drug level monitoring: Secondary | ICD-10-CM | POA: Diagnosis not present

## 2019-12-04 DIAGNOSIS — E785 Hyperlipidemia, unspecified: Secondary | ICD-10-CM | POA: Diagnosis not present

## 2019-12-04 DIAGNOSIS — I10 Essential (primary) hypertension: Secondary | ICD-10-CM | POA: Diagnosis not present

## 2019-12-11 DIAGNOSIS — M48061 Spinal stenosis, lumbar region without neurogenic claudication: Secondary | ICD-10-CM | POA: Diagnosis not present

## 2019-12-11 DIAGNOSIS — M47816 Spondylosis without myelopathy or radiculopathy, lumbar region: Secondary | ICD-10-CM | POA: Diagnosis not present

## 2019-12-11 DIAGNOSIS — R109 Unspecified abdominal pain: Secondary | ICD-10-CM | POA: Diagnosis not present

## 2019-12-11 DIAGNOSIS — I700 Atherosclerosis of aorta: Secondary | ICD-10-CM | POA: Diagnosis not present

## 2019-12-11 DIAGNOSIS — M545 Low back pain: Secondary | ICD-10-CM | POA: Diagnosis not present

## 2019-12-18 ENCOUNTER — Other Ambulatory Visit: Payer: Self-pay | Admitting: *Deleted

## 2019-12-18 ENCOUNTER — Encounter: Payer: Self-pay | Admitting: *Deleted

## 2019-12-18 DIAGNOSIS — E7800 Pure hypercholesterolemia, unspecified: Secondary | ICD-10-CM

## 2020-01-19 DIAGNOSIS — I2584 Coronary atherosclerosis due to calcified coronary lesion: Secondary | ICD-10-CM | POA: Diagnosis not present

## 2020-01-19 DIAGNOSIS — I10 Essential (primary) hypertension: Secondary | ICD-10-CM | POA: Diagnosis not present

## 2020-01-19 DIAGNOSIS — E785 Hyperlipidemia, unspecified: Secondary | ICD-10-CM | POA: Diagnosis not present

## 2020-01-19 DIAGNOSIS — D649 Anemia, unspecified: Secondary | ICD-10-CM | POA: Diagnosis not present

## 2020-02-10 ENCOUNTER — Other Ambulatory Visit: Payer: Self-pay

## 2020-02-10 ENCOUNTER — Encounter: Payer: Self-pay | Admitting: Cardiovascular Disease

## 2020-02-10 ENCOUNTER — Ambulatory Visit (INDEPENDENT_AMBULATORY_CARE_PROVIDER_SITE_OTHER): Payer: Medicare Other | Admitting: Cardiovascular Disease

## 2020-02-10 VITALS — BP 173/86 | HR 52 | Ht 72.0 in | Wt 193.0 lb

## 2020-02-10 DIAGNOSIS — Z9861 Coronary angioplasty status: Secondary | ICD-10-CM

## 2020-02-10 DIAGNOSIS — I10 Essential (primary) hypertension: Secondary | ICD-10-CM | POA: Diagnosis not present

## 2020-02-10 DIAGNOSIS — E7800 Pure hypercholesterolemia, unspecified: Secondary | ICD-10-CM

## 2020-02-10 DIAGNOSIS — I2510 Atherosclerotic heart disease of native coronary artery without angina pectoris: Secondary | ICD-10-CM

## 2020-02-10 LAB — LIPID PANEL
Chol/HDL Ratio: 2.3 ratio (ref 0.0–5.0)
Cholesterol, Total: 142 mg/dL (ref 100–199)
HDL: 62 mg/dL (ref >39)
LDL Chol Calc (NIH): 64 mg/dL (ref 0–99)
Triglycerides: 82 mg/dL (ref 0–149)
VLDL Cholesterol Cal: 16 mg/dL (ref 5–40)

## 2020-02-10 NOTE — Patient Instructions (Signed)
Medication Instructions:  No changes *If you need a refill on your cardiac medications before your next appointment, please call your pharmacy*   Lab Work: Your provider would like for you to have the following labs today: fasting lipid  If you have labs (blood work) drawn today and your tests are completely normal, you will receive your results only by: Marland Kitchen MyChart Message (if you have MyChart) OR . A paper copy in the mail If you have any lab test that is abnormal or we need to change your treatment, we will call you to review the results.   Testing/Procedures: None ordered   Follow-Up: At [facility], you and your health needs are our priority.  As part of our continuing mission to provide you with exceptional heart care, we have created designated Provider Care Teams.  These Care Teams include your primary Cardiologist (physician) and Advanced Practice Providers (APPs -  Physician Assistants and Nurse Practitioners) who all work together to provide you with the care you need, when you need it.  We recommend signing up for the patient portal called "MyChart".  Sign up information is provided on this After Visit Summary.  MyChart is used to connect with patients for Virtual Visits (Telemedicine).  Patients are able to view lab/test results, encounter notes, upcoming appointments, etc.  Non-urgent messages can be sent to your provider as well.   To learn more about what you can do with MyChart, go to [email or web address].    Your next appointment:   12 month(s)  The format for your next appointment:   In Person  Provider:   You may see Thurmon Fair, MD or one of the following Advanced Practice Providers on your designated Care Team:    Azalee Course, PA-C  Micah Flesher, PA-C or   Judy Pimple, [place]

## 2020-02-10 NOTE — Progress Notes (Signed)
Cardiology Office Note     Date:  02/10/2020   ID:  Kevin Wade, DOB 05-09-1951, MRN [id number]   PCP:  Yolanda Manges, DO  Cardiologist:  Thurmon Fair, MD  Electrophysiologist:  None   Evaluation Performed:  Follow-Up Visit  Chief Complaint:  CAD  History of Present Illness:    Kevin Wade is a 69 y.o. male with a history of hypertension hyperlipidemia presenting with a very small non-STEMI on November 13, 2017 followed by placement of a drug-eluting stent for a high-grade stenosis in the mid right coronary artery.  Also had a moderate 40% stenosis in the left circumflex coronary artery but has preserved left ventricular systolic function.   He has made a lot of very favorable changes to his lifestyle, eating a healthy diet and exercising regularly.  He has lost weight and has a BMI almost in optimal range.  The patient specifically denies any chest pain at rest exertion, dyspnea at rest or with exertion, orthopnea, paroxysmal nocturnal dyspnea, syncope, palpitations, focal neurological deficits, intermittent claudication, lower extremity edema, unexplained weight gain, cough, hemoptysis or wheezing.   Past Medical History:  Diagnosis Date  . Arthritis   . Hyperlipidemia   . Hypertension    Past Surgical History:  Procedure Laterality Date  . APPENDECTOMY    . CORONARY STENT INTERVENTION N/A 11/13/2017   Procedure: CORONARY STENT INTERVENTION;  Surgeon: Tonny Bollman, MD;  Location: [facility] INVASIVE CV LAB;  Service: Cardiovascular;  Laterality: N/A;  . LEFT HEART CATH AND CORONARY ANGIOGRAPHY N/A 11/13/2017   Procedure: LEFT HEART CATH AND CORONARY ANGIOGRAPHY;  Surgeon: Tonny Bollman, MD;  Location: [facility] INVASIVE CV LAB;  Service: Cardiovascular;  Laterality: N/A;     Current Meds  Medication Sig  . amLODipine (NORVASC) 5 MG tablet TAKE 1 TABLET BY MOUTH EVERYDAY AT BEDTIME  . aspirin EC 81 MG tablet Take 81 mg by mouth daily.  Marland Kitchen ezetimibe (ZETIA) 10 MG tablet TAKE ONE  TABLET BY MOUTH DAILY  . Misc Natural Products (OSTEO BI-FLEX JOINT SHIELD) TABS Take 1 tablet by mouth daily.  . Multiple Vitamins-Minerals (ONE-A-DAY MENS 50+ ADVANTAGE) TABS Take 1 tablet by mouth daily.  . nitroGLYCERIN (NITROSTAT) 0.4 MG SL tablet Place 1 tablet (0.4 mg total) under the tongue every 5 (five) minutes x 3 doses as needed for chest pain.  . Omega-3 1000 MG CAPS Take 1,000 mg by mouth daily.  . ramipril (ALTACE) 10 MG capsule TAKE 1 CAPSULE BY MOUTH EVERY DAY  . rosuvastatin (CRESTOR) 40 MG tablet Take 40 mg by mouth daily.     Allergies:   Patient has no known allergies.   Social History   Tobacco Use  . Smoking status: Never Smoker  . Smokeless tobacco: Never Used  Substance Use Topics  . Alcohol use: Yes    Comment: Socially   . Drug use: No     Family Hx: The patient's family history includes Hypertension in his father.  ROS:   Please see the history of present illness.   All other systems are reviewed and are negative.  Prior CV studies:   The following studies were reviewed today:  Labs from 12/04/2019 from PCP, unfortunately no lipid profile was performed  Labs/Other Tests and Data Reviewed:    EKG: Ordered today shows sinus bradycardia and early repolarization, no signs of previous infarction, no acute ischemic changes.  QT 460 ms  Recent Labs: 05/11/2019: ALT 36; BUN 16; Creatinine, Ser 1.14; Potassium 4.4; Sodium 139  Recent Lipid Panel Lab Results  Component Value Date/Time   CHOL 163 05/11/2019 11:20 AM   TRIG 72 05/11/2019 11:20 AM   HDL 63 05/11/2019 11:20 AM   CHOLHDL 2.6 05/11/2019 11:20 AM   CHOLHDL 3.0 11/14/2017 03:22 AM   LDLCALC 86 05/11/2019 11:20 AM    Wt Readings from Last 3 Encounters:  02/10/20 193 lb (87.5 kg)  11/27/18 186 lb (84.4 kg)  06/13/18 204 lb 6.4 oz (92.7 kg)     Objective:    Vital Signs:  BP (!) 173/86   Pulse (!) 52   Ht 6' (1.829 m)   Wt 193 lb (87.5 kg)   SpO2 100%   BMI 26.18 kg/m      General: Alert, oriented x3, no distress, appears fit Head: no evidence of trauma, PERRL, EOMI, no exophtalmos or lid lag, no myxedema, no xanthelasma; normal ears, nose and oropharynx Neck: normal jugular venous pulsations and no hepatojugular reflux; brisk carotid pulses without delay and no carotid bruits Chest: clear to auscultation, no signs of consolidation by percussion or palpation, normal fremitus, symmetrical and full respiratory excursions Cardiovascular: normal position and quality of the apical impulse, regular rhythm, normal first and second heart sounds, no murmurs, rubs or gallops Abdomen: no tenderness or distention, no masses by palpation, no abnormal pulsatility or arterial bruits, normal bowel sounds, no hepatosplenomegaly Extremities: no clubbing, cyanosis or edema; 2+ radial, ulnar and brachial pulses bilaterally; 2+ right femoral, posterior tibial and dorsalis pedis pulses; 2+ left femoral, posterior tibial and dorsalis pedis pulses; no subclavian or femoral bruits Neurological: grossly nonfocal Psych: Normal mood and affect   ASSESSMENT & PLAN:    1. CAD: Now >1 years status post tiny non-STEMI.  Completely asymptomatic.  On aspirin and statin, not on beta-blockers due to bradycardia 2. HTN: Well-controlled, continue same medications. 3. HLP: Ezetimibe added to his statin.  Target LDL less than 70 (was 86 before the ezetimibe), recheck labs today.  Patient Instructions  Medication Instructions:  No changes *If you need a refill on your cardiac medications before your next appointment, please call your pharmacy*   Lab Work: Your provider would like for you to have the following labs today: fasting lipid  If you have labs (blood work) drawn today and your tests are completely normal, you will receive your results only by: Marland Kitchen MyChart Message (if you have MyChart) OR . A paper copy in the mail If you have any lab test that is abnormal or we need to change your  treatment, we will call you to review the results.   Testing/Procedures: None ordered   Follow-Up: At [facility], you and your health needs are our priority.  As part of our continuing mission to provide you with exceptional heart care, we have created designated Provider Care Teams.  These Care Teams include your primary Cardiologist (physician) and Advanced Practice Providers (APPs -  Physician Assistants and Nurse Practitioners) who all work together to provide you with the care you need, when you need it.  We recommend signing up for the patient portal called "MyChart".  Sign up information is provided on this After Visit Summary.  MyChart is used to connect with patients for Virtual Visits (Telemedicine).  Patients are able to view lab/test results, encounter notes, upcoming appointments, etc.  Non-urgent messages can be sent to your provider as well.   To learn more about what you can do with MyChart, go to [email or web address].    Your next appointment:  12 month(s)  The format for your next appointment:   In Person  Provider:   You may see Thurmon Fair, MD or one of the following Advanced Practice Providers on your designated Care Team:    Azalee Course, PA-C  Micah Flesher, [place] or   Judy Pimple, PA-C     Signed, Thurmon Fair, MD  02/10/2020 10:02 AM    [facility]

## 2020-02-17 ENCOUNTER — Other Ambulatory Visit: Payer: Self-pay | Admitting: Cardiovascular Disease

## 2020-02-17 DIAGNOSIS — Z23 Encounter for immunization: Secondary | ICD-10-CM | POA: Diagnosis not present

## 2020-03-12 ENCOUNTER — Ambulatory Visit: Payer: Medicare Other | Attending: Internal Medicine

## 2020-03-12 DIAGNOSIS — Z23 Encounter for immunization: Secondary | ICD-10-CM

## 2020-03-12 NOTE — Progress Notes (Signed)
° °  Covid-19 Vaccination Clinic  Name:  Rayquon Uselman    MRN: [id number] DOB: Dec 31, 1950  03/12/2020  Mr. Fogg was observed post Covid-19 immunization for 15 minutes without incident. He was provided with Vaccine Information Sheet and instruction to access the V-Safe system.   Mr. Belling was instructed to call 911 with any severe reactions post vaccine:  Difficulty breathing   Swelling of face and throat   A fast heartbeat   A bad rash all over body   Dizziness and weakness

## 2020-04-21 ENCOUNTER — Encounter (HOSPITAL_COMMUNITY): Payer: Self-pay | Admitting: *Deleted

## 2020-04-21 ENCOUNTER — Observation Stay (HOSPITAL_BASED_OUTPATIENT_CLINIC_OR_DEPARTMENT_OTHER): Payer: Medicare Other

## 2020-04-21 ENCOUNTER — Emergency Department (HOSPITAL_COMMUNITY): Payer: Medicare Other

## 2020-04-21 ENCOUNTER — Other Ambulatory Visit: Payer: Self-pay

## 2020-04-21 ENCOUNTER — Encounter (HOSPITAL_COMMUNITY)
Admission: EM | Disposition: A | Discharge status: Home / Self Care | Payer: Self-pay | Source: Home / Self Care | Attending: Emergency Medicine

## 2020-04-21 ENCOUNTER — Observation Stay (HOSPITAL_COMMUNITY)
Admission: EM | Admit: 2020-04-21 | Discharge: 2020-04-22 | Disposition: A | Discharge status: Home / Self Care | Payer: Medicare Other | Attending: Cardiovascular Disease | Admitting: Cardiovascular Disease

## 2020-04-21 DIAGNOSIS — I2510 Atherosclerotic heart disease of native coronary artery without angina pectoris: Secondary | ICD-10-CM | POA: Diagnosis not present

## 2020-04-21 DIAGNOSIS — R079 Chest pain, unspecified: Principal | ICD-10-CM | POA: Insufficient documentation

## 2020-04-21 DIAGNOSIS — Z7982 Long term (current) use of aspirin: Secondary | ICD-10-CM | POA: Insufficient documentation

## 2020-04-21 DIAGNOSIS — Z20822 Contact with and (suspected) exposure to covid-19: Secondary | ICD-10-CM | POA: Insufficient documentation

## 2020-04-21 DIAGNOSIS — Z79899 Other long term (current) drug therapy: Secondary | ICD-10-CM | POA: Diagnosis not present

## 2020-04-21 DIAGNOSIS — R001 Bradycardia, unspecified: Secondary | ICD-10-CM | POA: Diagnosis present

## 2020-04-21 DIAGNOSIS — I25119 Atherosclerotic heart disease of native coronary artery with unspecified angina pectoris: Secondary | ICD-10-CM | POA: Diagnosis not present

## 2020-04-21 DIAGNOSIS — I10 Essential (primary) hypertension: Secondary | ICD-10-CM | POA: Insufficient documentation

## 2020-04-21 DIAGNOSIS — E7800 Pure hypercholesterolemia, unspecified: Secondary | ICD-10-CM | POA: Diagnosis present

## 2020-04-21 DIAGNOSIS — I200 Unstable angina: Secondary | ICD-10-CM | POA: Diagnosis not present

## 2020-04-21 HISTORY — PX: LEFT HEART CATH AND CORONARY ANGIOGRAPHY: CATH118249

## 2020-04-21 LAB — CBC
HCT: 44.1 % (ref 39.0–52.0)
HCT: 43.3 % (ref 39.0–52.0)
Hemoglobin: 14.7 g/dL (ref 13.0–17.0)
Hemoglobin: 13.6 g/dL (ref 13.0–17.0)
MCH: 30.9 pg (ref 26.0–34.0)
MCH: 29.8 pg (ref 26.0–34.0)
MCHC: 33.3 g/dL (ref 30.0–36.0)
MCHC: 31.4 g/dL (ref 30.0–36.0)
MCV: 92.8 fL (ref 80.0–100.0)
MCV: 94.7 fL (ref 80.0–100.0)
Platelets: 210 10*3/uL (ref 150–400)
Platelets: 155 10*3/uL (ref 150–400)
RBC: 4.75 MIL/uL (ref 4.22–5.81)
RBC: 4.57 MIL/uL (ref 4.22–5.81)
RDW: 13.4 % (ref 11.5–15.5)
RDW: 13.3 % (ref 11.5–15.5)
WBC: 6.7 10*3/uL (ref 4.0–10.5)
WBC: 5.5 10*3/uL (ref 4.0–10.5)
nRBC: 0 % (ref 0.0–0.2)
nRBC: 0 % (ref 0.0–0.2)

## 2020-04-21 LAB — ECHOCARDIOGRAM COMPLETE
Area-P 1/2: 2.71 cm2
Height: 72 in
S' Lateral: 2.7 cm
Single Plane A4C EF: 41.2 %
Weight: 2959.46 oz

## 2020-04-21 LAB — BASIC METABOLIC PANEL
Anion gap: 10 (ref 5–15)
BUN: 18 mg/dL (ref 8–23)
CO2: 24 mmol/L (ref 22–32)
Calcium: 9.8 mg/dL (ref 8.9–10.3)
Chloride: 104 mmol/L (ref 98–111)
Creatinine, Ser: 1.25 mg/dL — ABNORMAL HIGH (ref 0.61–1.24)
GFR, Estimated: >60 mL/min (ref >60)
Glucose, Bld: 93 mg/dL (ref 70–99)
Potassium: 4.1 mmol/L (ref 3.5–5.1)
Sodium: 138 mmol/L (ref 135–145)

## 2020-04-21 LAB — HIV ANTIBODY (ROUTINE TESTING W REFLEX): HIV Screen 4th Generation wRfx: NONREACTIVE

## 2020-04-21 LAB — RESP PANEL BY RT-PCR (FLU A&B, COVID) ARPGX2
Influenza A by PCR: NEGATIVE
Influenza B by PCR: NEGATIVE
SARS Coronavirus 2 by RT PCR: NEGATIVE

## 2020-04-21 LAB — CREATININE, SERUM
Creatinine, Ser: 1.11 mg/dL (ref 0.61–1.24)
GFR, Estimated: >60 mL/min (ref >60)

## 2020-04-21 LAB — TROPONIN I (HIGH SENSITIVITY)
Troponin I (High Sensitivity): 47 ng/L — ABNORMAL HIGH (ref <18)
Troponin I (High Sensitivity): 45 ng/L — ABNORMAL HIGH (ref <18)

## 2020-04-21 SURGERY — LEFT HEART CATH AND CORONARY ANGIOGRAPHY
Anesthesia: LOCAL

## 2020-04-21 MED ORDER — HEPARIN SODIUM (PORCINE) 5000 UNIT/ML IJ SOLN
5000.0000 [IU] | Freq: Three times a day (TID) | INTRAMUSCULAR | Status: DC
  Administered 2020-04-21: 5000 [IU] via SUBCUTANEOUS
  Filled 2020-04-21: qty 1

## 2020-04-21 MED ORDER — SODIUM CHLORIDE 0.9% FLUSH: 3.0000 mL | INTRAVENOUS | Status: DC | PRN

## 2020-04-21 MED ORDER — SODIUM CHLORIDE 0.9 % WEIGHT BASED INFUSION: 1.0000 mL/kg/h | INTRAVENOUS | Status: DC

## 2020-04-21 MED ORDER — LABETALOL HCL 5 MG/ML IV SOLN
10.0000 mg | Freq: Once | INTRAVENOUS | Status: AC
  Administered 2020-04-21: 10 mg via INTRAVENOUS
  Filled 2020-04-21: qty 4

## 2020-04-21 MED ORDER — MIDAZOLAM HCL 2 MG/2ML IJ SOLN
INTRAMUSCULAR | Status: AC
  Filled 2020-04-21: qty 2

## 2020-04-21 MED ORDER — NITROGLYCERIN 0.4 MG SL SUBL
0.4000 mg | SUBLINGUAL_TABLET | SUBLINGUAL | Status: DC | PRN
  Filled 2020-04-21: qty 1

## 2020-04-21 MED ORDER — AMLODIPINE BESYLATE 5 MG PO TABS
5.0000 mg | ORAL_TABLET | Freq: Every day | ORAL | Status: DC
  Administered 2020-04-21: 5 mg via ORAL
  Filled 2020-04-21: qty 1

## 2020-04-21 MED ORDER — ASPIRIN 81 MG PO CHEW
324.0000 mg | CHEWABLE_TABLET | ORAL | Status: AC
  Filled 2020-04-21: qty 4

## 2020-04-21 MED ORDER — SODIUM CHLORIDE 0.9% FLUSH
3.0000 mL | Freq: Two times a day (BID) | INTRAVENOUS | Status: DC
  Administered 2020-04-21 – 2020-04-22 (×2): 3 mL via INTRAVENOUS

## 2020-04-21 MED ORDER — SODIUM CHLORIDE 0.9 % IV SOLN: INTRAVENOUS | Status: DC

## 2020-04-21 MED ORDER — HEPARIN (PORCINE) IN NACL 1000-0.9 UT/500ML-% IV SOLN
INTRAVENOUS | Status: DC | PRN
  Administered 2020-04-21 (×3): 500 mL

## 2020-04-21 MED ORDER — VERAPAMIL HCL 2.5 MG/ML IV SOLN
INTRAVENOUS | Status: DC | PRN
  Administered 2020-04-21: 10 mL via INTRA_ARTERIAL

## 2020-04-21 MED ORDER — LIDOCAINE HCL (PF) 1 % IJ SOLN
INTRAMUSCULAR | Status: DC | PRN
  Administered 2020-04-21: 5 mL

## 2020-04-21 MED ORDER — ONDANSETRON HCL 4 MG/2ML IJ SOLN: 4.0000 mg | Freq: Four times a day (QID) | INTRAMUSCULAR | Status: DC | PRN

## 2020-04-21 MED ORDER — ACETAMINOPHEN 325 MG PO TABS: 650.0000 mg | ORAL_TABLET | ORAL | Status: DC | PRN

## 2020-04-21 MED ORDER — SODIUM CHLORIDE 0.9 % IV SOLN: 250.0000 mL | INTRAVENOUS | Status: DC | PRN

## 2020-04-21 MED ORDER — ENOXAPARIN SODIUM 40 MG/0.4ML ~~LOC~~ SOLN
40.0000 mg | SUBCUTANEOUS | Status: DC
  Administered 2020-04-22: 40 mg via SUBCUTANEOUS
  Filled 2020-04-21: qty 0.4

## 2020-04-21 MED ORDER — DIAZEPAM 5 MG PO TABS: 5.0000 mg | ORAL_TABLET | ORAL | Status: DC | PRN

## 2020-04-21 MED ORDER — EZETIMIBE 10 MG PO TABS
10.0000 mg | ORAL_TABLET | Freq: Every day | ORAL | Status: DC
  Administered 2020-04-21 – 2020-04-22 (×2): 10 mg via ORAL
  Filled 2020-04-21 (×2): qty 1

## 2020-04-21 MED ORDER — SODIUM CHLORIDE 0.9 % WEIGHT BASED INFUSION
3.0000 mL/kg/h | INTRAVENOUS | Status: DC
  Administered 2020-04-21 (×2): 3 mL/kg/h via INTRAVENOUS

## 2020-04-21 MED ORDER — LABETALOL HCL 5 MG/ML IV SOLN: 10.0000 mg | INTRAVENOUS | Status: AC | PRN

## 2020-04-21 MED ORDER — HYDRALAZINE HCL 20 MG/ML IJ SOLN: 10.0000 mg | INTRAMUSCULAR | Status: AC | PRN

## 2020-04-21 MED ORDER — LIDOCAINE HCL (PF) 1 % IJ SOLN
INTRAMUSCULAR | Status: AC
  Filled 2020-04-21: qty 30

## 2020-04-21 MED ORDER — ASPIRIN 81 MG PO CHEW
324.0000 mg | CHEWABLE_TABLET | Freq: Once | ORAL | Status: AC
  Administered 2020-04-21: 324 mg via ORAL
  Filled 2020-04-21: qty 4

## 2020-04-21 MED ORDER — ASPIRIN 81 MG PO CHEW
81.0000 mg | CHEWABLE_TABLET | Freq: Every day | ORAL | Status: DC
  Administered 2020-04-22: 81 mg via ORAL
  Filled 2020-04-21 (×2): qty 1

## 2020-04-21 MED ORDER — ASPIRIN EC 81 MG PO TBEC: 81.0000 mg | DELAYED_RELEASE_TABLET | Freq: Every day | ORAL | Status: DC

## 2020-04-21 MED ORDER — ASPIRIN 300 MG RE SUPP: 300.0000 mg | RECTAL | Status: AC

## 2020-04-21 MED ORDER — ROSUVASTATIN CALCIUM 20 MG PO TABS
40.0000 mg | ORAL_TABLET | Freq: Every day | ORAL | Status: DC
  Administered 2020-04-21 – 2020-04-22 (×2): 40 mg via ORAL
  Filled 2020-04-21 (×2): qty 2

## 2020-04-21 MED ORDER — HEPARIN SODIUM (PORCINE) 1000 UNIT/ML IJ SOLN
INTRAMUSCULAR | Status: DC | PRN
  Administered 2020-04-21: 4500 [IU] via INTRAVENOUS

## 2020-04-21 MED ORDER — FENTANYL CITRATE (PF) 100 MCG/2ML IJ SOLN
INTRAMUSCULAR | Status: AC
  Filled 2020-04-21: qty 2

## 2020-04-21 MED ORDER — VERAPAMIL HCL 2.5 MG/ML IV SOLN
INTRAVENOUS | Status: AC
  Filled 2020-04-21: qty 2

## 2020-04-21 MED ORDER — FENTANYL CITRATE (PF) 100 MCG/2ML IJ SOLN
INTRAMUSCULAR | Status: DC | PRN
  Administered 2020-04-21: 25 ug via INTRAVENOUS

## 2020-04-21 MED ORDER — MIDAZOLAM HCL 2 MG/2ML IJ SOLN
INTRAMUSCULAR | Status: DC | PRN
  Administered 2020-04-21: 2 mg via INTRAVENOUS

## 2020-04-21 MED ORDER — SODIUM CHLORIDE 0.9% FLUSH: 3.0000 mL | Freq: Two times a day (BID) | INTRAVENOUS | Status: DC

## 2020-04-21 MED ORDER — HEPARIN SODIUM (PORCINE) 1000 UNIT/ML IJ SOLN
INTRAMUSCULAR | Status: AC
  Filled 2020-04-21: qty 1

## 2020-04-21 MED ORDER — IOHEXOL 350 MG/ML SOLN
INTRAVENOUS | Status: DC | PRN
  Administered 2020-04-21: 55 mL

## 2020-04-21 MED ORDER — HEPARIN (PORCINE) IN NACL 1000-0.9 UT/500ML-% IV SOLN
INTRAVENOUS | Status: AC
  Filled 2020-04-21: qty 1000

## 2020-04-21 SURGICAL SUPPLY — 12 items
CATH INFINITI 5 FR JL3.5 (CATHETERS) ×2 IMPLANT
CATH INFINITI JR4 5F (CATHETERS) ×2 IMPLANT
CATH OPTITORQUE TIG 4.0 5F (CATHETERS) ×2 IMPLANT
DEVICE RAD COMP TR BAND LRG (VASCULAR PRODUCTS) ×2 IMPLANT
GLIDESHEATH SLEND SS 6F .021 (SHEATH) ×2 IMPLANT
GUIDEWIRE INQWIRE 1.5J.035X260 (WIRE) ×1 IMPLANT
INQWIRE 1.5J .035X260CM (WIRE) ×2
KIT HEART LEFT (KITS) ×2 IMPLANT
PACK CARDIAC CATHETERIZATION (CUSTOM PROCEDURE TRAY) ×2 IMPLANT
SHEATH PROBE COVER 6X72 (BAG) ×2 IMPLANT
TRANSDUCER W/STOPCOCK (MISCELLANEOUS) ×2 IMPLANT
TUBING CIL FLEX 10 FLL-RA (TUBING) ×2 IMPLANT

## 2020-04-21 NOTE — ED Provider Notes (Signed)
[facility] EMERGENCY DEPARTMENT Provider Note   CSN: [id number] Arrival date & time: 04/21/20  7253     History Chief Complaint  Patient presents with  . Chest Pain    Kevin Wade is a 69 y.o. male.  HPI     69yo male with history of hypertension, hyperlipidemia, NSTEMI 2019 with DES for high grade stenosis mid right coronary artery, moderate stenosis left circumflex, who presents with concern for chest pain.    Yesterday had brief episode that resolved 230AM woke with chest pain, lasted 2 minutes and improved, then got up and took an aspirin 81mg  and it started again, woke up wife and came to the hospital Hx of stent, knows another artery with some disease. Was pin type symptom.  5/10 in severity. No radation of pan.  Similar to pain he had previously with NSTEMI, in that initially had pin type symptoms then it worsened  Feeling lightheaded over the last few days on occasion Nausea once NO lightheadedness or nausea with the chest pain however, no shortness of breath  CP not necessarily worse with exertion but does feel similar to how it did with prior angioplasty. Thinks he probably needs this done again.   Dr. Cardiology  Past Medical History:  Diagnosis Date  . Arthritis   . Hyperlipidemia   . Hypertension     Patient Active Problem List   Diagnosis Date Noted  . Coronary artery disease involving native coronary artery of native heart without angina pectoris 03/07/2018  . Dyspnea 12/10/2017  . CAD S/P percutaneous coronary angioplasty 11/22/2017  . Bradycardia, sinus 11/22/2017  . Bradycardia 11/13/2017  . Angina pectoris syndrome (HCC) 11/13/2017  . Non-ST elevation (NSTEMI) myocardial infarction (HCC) 11/12/2017  . Essential hypertension 11/12/2017  . Hypercholesterolemia 11/12/2017    Past Surgical History:  Procedure Laterality Date  . APPENDECTOMY    . CORONARY STENT INTERVENTION N/A 11/13/2017   Procedure: CORONARY  STENT INTERVENTION;  Surgeon: 01/14/2018, MD;  Location: [facility] INVASIVE CV LAB;  Service: Cardiovascular;  Laterality: N/A;  . LEFT HEART CATH AND CORONARY ANGIOGRAPHY N/A 11/13/2017   Procedure: LEFT HEART CATH AND CORONARY ANGIOGRAPHY;  Surgeon: 01/14/2018, MD;  Location: [facility] INVASIVE CV LAB;  Service: Cardiovascular;  Laterality: N/A;       Family History  Problem Relation Age of Onset  . Hypertension Father     Social History   Tobacco Use  . Smoking status: Never Smoker  . Smokeless tobacco: Never Used  Substance Use Topics  . Alcohol use: Yes    Comment: Socially   . Drug use: No    Home Medications Prior to Admission medications   Medication Sig Start Date End Date Taking? Authorizing Provider  Acetaminophen-Codeine 300-30 MG tablet Take 1 tablet by mouth every 6 (six) hours as needed for pain. 02/25/20  Yes [provider]  amLODipine (NORVASC) 5 MG tablet TAKE 1 TABLET BY MOUTH EVERYDAY AT BEDTIME Patient taking differently: Take 5 mg by mouth at bedtime. 09/22/18  Yes Croitoru, Mihai, MD  aspirin EC 81 MG tablet Take 81 mg by mouth daily.   Yes [provider]  ezetimibe (ZETIA) 10 MG tablet TAKE ONE TABLET BY MOUTH DAILY Patient taking differently: Take 10 mg by mouth daily. 08/10/19  Yes Croitoru, 08/12/19, MD  Misc Natural Products (OSTEO BI-FLEX JOINT SHIELD) TABS Take 1 tablet by mouth daily.   Yes [provider]  Multiple Vitamins-Minerals (ONE-A-DAY MENS 50+ ADVANTAGE) TABS Take 1 tablet  by mouth daily.   Yes [provider]  nitroGLYCERIN (NITROSTAT) 0.4 MG SL tablet Place 1 tablet (0.4 mg total) under the tongue every 5 (five) minutes x 3 doses as needed for chest pain. 11/14/17  Yes Arrien, York Ram, MD  Omega-3 1000 MG CAPS Take 1,000 mg by mouth daily.   Yes [provider]  ramipril (ALTACE) 10 MG capsule TAKE 1 CAPSULE BY MOUTH EVERY DAY Patient taking differently: Take 10 mg by mouth daily. 02/17/20  Yes  Croitoru, Mihai, MD  rosuvastatin (CRESTOR) 40 MG tablet Take 40 mg by mouth daily.   Yes [provider]    Allergies    Patient has no known allergies.  Review of Systems   Review of Systems  Constitutional: Negative for fever.  HENT: Positive for postnasal drip (mucus weather changes). Negative for sore throat.   Eyes: Negative for visual disturbance.  Respiratory: Negative for cough and shortness of breath.   Cardiovascular: Positive for chest pain. Negative for leg swelling.  Gastrointestinal: Positive for nausea. Negative for abdominal pain and vomiting.  Genitourinary: Negative for difficulty urinating.  Musculoskeletal: Negative for back pain and neck stiffness.  Skin: Negative for rash.  Neurological: Positive for light-headedness. Negative for syncope and headaches.    Physical Exam Updated Vital Signs BP (!) 158/87   Pulse (!) 47   Temp 98.5 F (36.9 C) (Oral)   Resp 15   Ht 6' (1.829 m)   Wt 87.5 kg   SpO2 99%   BMI 26.18 kg/m   Physical Exam Vitals and nursing note reviewed.  Constitutional:      General: He is not in acute distress.    Appearance: He is well-developed and well-nourished. He is not diaphoretic.  HENT:     Head: Normocephalic and atraumatic.  Eyes:     Extraocular Movements: EOM normal.     Conjunctiva/sclera: Conjunctivae normal.  Cardiovascular:     Rate and Rhythm: Normal rate and regular rhythm.     Pulses: Intact distal pulses.     Heart sounds: Normal heart sounds. No murmur heard. No friction rub. No gallop.   Pulmonary:     Effort: Pulmonary effort is normal. No respiratory distress.     Breath sounds: Normal breath sounds. No wheezing or rales.  Abdominal:     General: There is no distension.     Palpations: Abdomen is soft.     Tenderness: There is no abdominal tenderness. There is no guarding.  Musculoskeletal:        General: No edema.     Cervical back: Normal range of motion.  Skin:    General: Skin is  warm and dry.  Neurological:     Mental Status: He is alert and oriented to person, place, and time.     ED Results / Procedures / Treatments   Labs (all labs ordered are listed, but only abnormal results are displayed) Labs Reviewed  BASIC METABOLIC PANEL - Abnormal; Notable for the following components:      Result Value   Creatinine, Ser 1.25 (*)    All other components within normal limits  TROPONIN I (HIGH SENSITIVITY) - Abnormal; Notable for the following components:   Troponin I (High Sensitivity) 47 (*)    All other components within normal limits  TROPONIN I (HIGH SENSITIVITY) - Abnormal; Notable for the following components:   Troponin I (High Sensitivity) 45 (*)    All other components within normal limits  RESP PANEL BY RT-PCR (  FLU A&B, COVID) [id number]  CBC    EKG EKG Interpretation  Date/Time:  Thursday April 21 2020 02:50:47 EST Ventricular Rate:  59 PR Interval:  158 QRS Duration: 80 QT Interval:  378 QTC Calculation: 374 R Axis:   63 Text Interpretation: Sinus bradycardia Otherwise normal ECG No significant change since last tracing in 2019 Confirmed by Ward, Baxter Hire [id number]) on 04/21/2020 4:56:59 AM Also confirmed by Alvira Monday ([id number])  on 04/21/2020 5:27:50 AM   Radiology DG Chest 2 View  Result Date: 04/21/2020 CLINICAL DATA:  Chest pain EXAM: CHEST - 2 VIEW COMPARISON:  None. FINDINGS: The heart size and mediastinal contours are within normal limits. Aortic knob calcifications are seen. Both lungs are clear. The visualized skeletal structures are unremarkable. IMPRESSION: No active cardiopulmonary disease. Electronically Signed   By: Jonna Clark M.D.   On: 04/21/2020 03:43    Procedures Procedures (including critical care time)  Medications Ordered in ED Medications  nitroGLYCERIN (NITROSTAT) SL tablet 0.4 mg (has no administration in time range)  aspirin chewable tablet 324 mg (324 mg Oral Given 04/21/20 0619)  labetalol (NORMODYNE) injection  10 mg (10 mg Intravenous Given 04/21/20 3474)    ED Course  I have reviewed the triage vital signs and the nursing notes.  Pertinent labs & imaging results that were available during my care of the patient were reviewed by me and considered in my medical decision making (see chart for details).    MDM Rules/Calculators/A&P                          69yo male with history of hypertension, hyperlipidemia, NSTEMI 2019 with DES for high grade stenosis mid right coronary artery, moderate stenosis left circumflex, who presents with concern for chest pain.    Differential diagnosis for chest pain includes pulmonary embolus, dissection, pneumothorax, pneumonia, ACS, myocarditis, pericarditis.  EKG was done and evaluate by me and showed no acute ST changes and no signs of pericarditis-is similar to prior. Chest x-ray was done and evaluated by me and radiology and showed no sign of pneumonia or pneumothorax. Low suspicion for PE and aortic dissection by history and exam, normal UE and LE pulses.   Troponins in 40s, may be related to htn--however pt with known CAD and reports similar pain to prior anginal symptoms. Will consult to Cardiology.  Given labetalol and asa.     Final Clinical Impression(s) / ED Diagnoses Final diagnoses:  Chest pain, unspecified type    Rx / DC Orders ED Discharge Orders    None       Alvira Monday, MD 04/21/20 [phone]

## 2020-04-21 NOTE — ED Provider Notes (Signed)
  Provider Note MRN:  [id number]  Arrival date & time: 04/21/20    ED Course and Medical Decision Making  Assumed care from Dr. Dalene Seltzer at shift change.  Chest pain, high risk, troponins mildly elevated in the 40s, stable.  Will consult cardiology, if they do not wish to admit will admit to hospital service.  Admitted to cardiology service for further care.  Procedures  Final Clinical Impressions(s) / ED Diagnoses     ICD-10-CM   1. Chest pain, unspecified type  R07.9     ED Discharge Orders    None      Discharge Instructions   None     Elmer Sow. Pilar Plate, MD [facility] Emergency Medicine [facility] [email or web address]    Sabas Sous, MD 04/21/20 [phone]

## 2020-04-21 NOTE — H&P (Addendum)
Cardiology Admission History and Physical:   Patient ID: Kevin Wade MRN: [id number]; DOB: 05-Dec-1950   Admission date: 04/21/2020  Primary Care Provider: Alysia Penna, MD [facility] Cardiologist: Thurmon Fair, MD  [facility] Electrophysiologist:  None   Chief Complaint:  Chest pain  Patient Profile:   Kevin Wade is a 69 y.o. male with PMH of HTN, HLD, CAD with NSTEMI s/p PCI/DES to the [facility] who presented to the ED with chest pain.   History of Present Illness:   Kevin Wade is a 69 yo male with PMH noted above. He is followed by Dr. [name] as an outpatient. Presented back in July 2019 with small NSTEMI and had a DES placed for high-grade stenosis in the mRCA, along with moderate 40% stenosis in the Lcx. Noted to have preserved LF function. Treated with DAPT- ASA/Brilinta for one year.   He was last seen in the office on 01/2020 by Dr. [name] reported doing well at that time.  States he has been in his usual state of health since that visit up until 2 days ago.  Reports he experienced a pin-like sensation of pain over on the left side of his chest while at rest on Tuesday night.  Symptoms subsided after he took aspirin.  He then had recurrence of the sensation about 2 a.m. this morning while he was sleeping.  States he did not necessarily feel like this woke him from sleep.  Did not necessarily feel short of breath or nauseated at that time.  He contemplated taking sublingual nitroglycerin but decided against doing so.  He woke his wife from sleep explaining his symptoms and she drove him to the ER for further evaluation.  In talking with the patient he reports the symptoms are very similar to what he experienced with his previous PCI back in 2019.  States he had the same pin-like sensation across the left side of his chest and was admitted overnight to the hospital for observation.  The following morning had chest heaviness and was taken for cardiac cath with DES placed to  the RCA.  States he has not had the chest heaviness with these episodes.  Reports being compliant with his home blood pressure medications and statin.  Blood pressures have been slightly elevated into the 140s systolic at home prior to admission.  On arrival to the ED he was noted to be hypertensive with systolic blood pressure of 173.  His labs showed stable electrolytes, creatinine 1.25, high-sensitivity troponin 47>> 45, WBC 6.7, hemoglobin 14.7.  Chest x-ray showed cute findings.  EKG showed sinus bradycardia with no acute ischemia.  Patient he reports having 2 recurrent episodes of this pin-like sensation over on the left side of his chest since presenting to the ED.   Past Medical History:  Diagnosis Date  . Arthritis   . Hyperlipidemia   . Hypertension     Past Surgical History:  Procedure Laterality Date  . APPENDECTOMY    . CORONARY STENT INTERVENTION N/A 11/13/2017   Procedure: CORONARY STENT INTERVENTION;  Surgeon: Tonny Bollman, MD;  Location: [facility] INVASIVE CV LAB;  Service: Cardiovascular;  Laterality: N/A;  . LEFT HEART CATH AND CORONARY ANGIOGRAPHY N/A 11/13/2017   Procedure: LEFT HEART CATH AND CORONARY ANGIOGRAPHY;  Surgeon: Tonny Bollman, MD;  Location: [facility] INVASIVE CV LAB;  Service: Cardiovascular;  Laterality: N/A;     Medications Prior to Admission: Prior to Admission medications   Medication Sig Start Date End Date Taking? Authorizing Provider  Acetaminophen-Codeine 300-30 MG  tablet Take 1 tablet by mouth every 6 (six) hours as needed for pain. 02/25/20  Yes [provider]  amLODipine (NORVASC) 5 MG tablet TAKE 1 TABLET BY MOUTH EVERYDAY AT BEDTIME Patient taking differently: Take 5 mg by mouth at bedtime. 09/22/18  Yes Croitoru, Mihai, MD  aspirin EC 81 MG tablet Take 81 mg by mouth daily.   Yes [provider]  ezetimibe (ZETIA) 10 MG tablet TAKE ONE TABLET BY MOUTH DAILY Patient taking differently: Take 10 mg by mouth daily. 08/10/19  Yes Croitoru,  Rachelle HoraMihai, MD  Misc Natural Products (OSTEO BI-FLEX JOINT SHIELD) TABS Take 1 tablet by mouth daily.   Yes [provider]  Multiple Vitamins-Minerals (ONE-A-DAY MENS 50+ ADVANTAGE) TABS Take 1 tablet by mouth daily.   Yes [provider]  nitroGLYCERIN (NITROSTAT) 0.4 MG SL tablet Place 1 tablet (0.4 mg total) under the tongue every 5 (five) minutes x 3 doses as needed for chest pain. 11/14/17  Yes Arrien, York RamMauricio Daniel, MD  Omega-3 1000 MG CAPS Take 1,000 mg by mouth daily.   Yes [provider]  ramipril (ALTACE) 10 MG capsule TAKE 1 CAPSULE BY MOUTH EVERY DAY Patient taking differently: Take 10 mg by mouth daily. 02/17/20  Yes Croitoru, Mihai, MD  rosuvastatin (CRESTOR) 40 MG tablet Take 40 mg by mouth daily.   Yes [provider]     Allergies:   No Known Allergies  Social History:   Social History   Socioeconomic History  . Marital status: Married    Spouse name: Not on file  . Number of children: Not on file  . Years of education: Not on file  . Highest education level: Not on file  Occupational History  . Not on file  Tobacco Use  . Smoking status: Never Smoker  . Smokeless tobacco: Never Used  Substance and Sexual Activity  . Alcohol use: Yes    Comment: Socially   . Drug use: No  . Sexual activity: Not on file  Other Topics Concern  . Not on file  Social History Narrative  . Not on file   Social Determinants of Health   Financial Resource Strain: Not on file  Food Insecurity: Not on file  Transportation Needs: Not on file  Physical Activity: Not on file  Stress: Not on file  Social Connections: Not on file  Intimate Partner Violence: Not on file    Family History:   The patient's family history includes Hypertension in his father.    ROS:  Please see the history of present illness.  All other ROS reviewed and negative.     Physical Exam/Data:   Vitals:   04/21/20 0815 04/21/20 0915 04/21/20 1015 04/21/20 1115  BP: (!)  141/76 (!) 158/87 (!) 151/85 (!) 163/89  Pulse: (!) 48 (!) 47 (!) 47 (!) 49  Resp: 15 15 16 14   Temp:      TempSrc:      SpO2: 97% 99% 98% 99%  Weight:      Height:       No intake or output data in the 24 hours ending 04/21/20 1145 Last 3 Weights 04/21/2020 02/10/2020 11/27/2018  Weight (lbs) 193 lb 193 lb 186 lb  Weight (kg) 87.544 kg 87.544 kg 84.369 kg     Body mass index is 26.18 kg/m.  General:  Well nourished, well developed, in no acute distress HEENT: normal Lymph: no adenopathy Neck: no JVD Endocrine:  No thryomegaly Vascular: No carotid bruits; FA pulses  2+ bilaterally without bruits  Cardiac:  normal S1, S2; RRR; no murmur  Lungs:  clear to auscultation bilaterally, no wheezing, rhonchi or rales  Abd: soft, nontender, no hepatomegaly  Ext: no edema Musculoskeletal:  No deformities, BUE and BLE strength normal and equal Skin: warm and dry  Neuro:  CNs 2-12 intact, no focal abnormalities noted Psych:  Normal affect    EKG:  The ECG that was done 12/9 was personally reviewed and demonstrates SB 59bpm without ischemia  Relevant CV Studies:  Cath: 11/2017   Mid RCA lesion is 95% stenosed.  Ost Cx to Prox Cx lesion is 40% stenosed.  Prox Cx to Mid Cx lesion is 30% stenosed.  Prox RCA lesion is 30% stenosed.  A drug-eluting stent was successfully placed using a STENT SIERRA 3.50 X 28 MM.  Post intervention, there is a 0% residual stenosis.  The left ventricular systolic function is normal.  The left ventricular ejection fraction is 55-65% by visual estimate.   1.  Severe single-vessel coronary artery disease with severe stenosis of the mid RCA, treated successfully with PCI using a 3.5 x 28 mm Xience Sierra DES 2.  Mild nonobstructive disease of the LAD and left circumflex 3.  Mild segmental contraction abnormality left ventricle with preserved overall LV function, LVEF estimated at 55 to 65%  Recommend uninterrupted dual antiplatelet therapy with Aspirin  81mg  daily and Ticagrelor 90mg  twice daily for a minimum of 12 months (ACS - Class I recommendation). Diagnostic Dominance: Right    Intervention      Laboratory Data:  High Sensitivity Troponin:   Recent Labs  Lab 04/21/20 0319 04/21/20 0540  TROPONINIHS 47* 45*      Chemistry Recent Labs  Lab 04/21/20 0319  NA 138  K 4.1  CL 104  CO2 24  GLUCOSE 93  BUN 18  CREATININE 1.25*  CALCIUM 9.8  GFRNONAA >60  ANIONGAP 10    No results for input(s): PROT, ALBUMIN, AST, ALT, ALKPHOS, BILITOT in the last 168 hours. Hematology Recent Labs  Lab 04/21/20 0319  WBC 6.7  RBC 4.75  HGB 14.7  HCT 44.1  MCV 92.8  MCH 30.9  MCHC 33.3  RDW 13.4  PLT 210   BNPNo results for input(s): BNP, PROBNP in the last 168 hours.  DDimer No results for input(s): DDIMER in the last 168 hours.   Radiology/Studies:  DG Chest 2 View  Result Date: 04/21/2020 CLINICAL DATA:  Chest pain EXAM: CHEST - 2 VIEW COMPARISON:  None. FINDINGS: The heart size and mediastinal contours are within normal limits. Aortic knob calcifications are seen. Both lungs are clear. The visualized skeletal structures are unremarkable. IMPRESSION: No active cardiopulmonary disease. Electronically Signed   By: 14/09/21 M.D.   On: 04/21/2020 03:43     Assessment and Plan:   Kevin Wade is a 69 y.o. male with PMH of HTN, HLD, CAD with NSTEMI s/p PCI/DES to the [facility] who presented to the ED with chest pain.   1. Chest pain: Reports several episodes of pin-like sensation on the left side of his chest over the past couple days.  Initial symptoms improved with aspirin.  Has had more frequent episodes today first 1 being around 2 AM with 2 subsequent episodes while in the ED.  High-sensitivity troponin 47>>45, EKG without acute ischemia.  He is currently pain-free at the time of assessment.  Given that his symptoms are very similar to prior presentation in 2019 requiring stenting, would further evaluate with  cardiac  catheterization -- The patient understands that risks included but are not limited to stroke (1 in 1000), death (1 in 1000), kidney failure [usually temporary] (1 in 500), bleeding (1 in 200), allergic reaction [possibly serious] (1 in 200).  --continue ASA, statin, Zetia and novasc  2. Hx of CAD with DES to the mRCA: treated with ASA/Brilinta for one year without bleeding complications.   3. HTN: reports he has been compliant with home medications, but readings have been elevated at times.  -- may need to further adjust medications post cath  -- holding on ACE with plans for cath  [facility] Score (for undifferentiated chest pain):  HEAR Score: 4   Severity of Illness: The appropriate patient status for this patient is OBSERVATION. Observation status is judged to be reasonable and necessary in order to provide the required intensity of service to ensure the patient's safety. The patient's presenting symptoms, physical exam findings, and initial radiographic and laboratory data in the context of their medical condition is felt to place them at decreased risk for further clinical deterioration. Furthermore, it is anticipated that the patient will be medically stable for discharge from the hospital within 2 midnights of admission. The following factors support the patient status of observation.   " The patient's presenting symptoms include chest pain. " The physical exam findings include stable exam. " The initial radiographic and laboratory data are elevated troponin.     For questions or updates, please contact [facility] Please consult [email or web address] for contact info under     Signed, Laverda Page, NP  04/21/2020 11:45 AM   Attending Note:   The patient was seen and examined.  Agree with assessment and plan as noted above.  Changes made to the above note as needed.  Patient seen and independently examined with Laverda Page, NP .   We discussed all aspects of the encounter. I  agree with the assessment and plan as stated above.  1.   Unstable angina :  Pt presents with symptoms very similar to his presenting symptoms prior to stenting.   troponins are mildly elevated.  Will proceed with cath today  We discussed risks, benefits, options. He understands and agrees to proceed.   2.  HTN:  Cont current meds.      I have spent a total of 40 minutes with patient reviewing hospital  notes , telemetry, EKGs, labs and examining patient as well as establishing an assessment and plan that was discussed with the patient. > 50% of time was spent in direct patient care.    Vesta Mixer, Montez Hageman., MD, [facility] 04/21/2020, 12:59 PM [address],  [address] Office [phone] Pager [phone]

## 2020-04-21 NOTE — ED Triage Notes (Signed)
The pt has had chest pain and bp high tonight no chest pain at present

## 2020-04-21 NOTE — Interval H&P Note (Signed)
Cath Lab Visit (complete for each Cath Lab visit)  Clinical Evaluation Leading to the Procedure:   ACS: No.  Non-ACS:    Anginal Classification: CCS III  Anti-ischemic medical therapy: Minimal Therapy (1 class of medications)  Non-Invasive Test Results: No non-invasive testing performed  Prior CABG: No previous CABG      History and Physical Interval Note:  04/21/2020 2:10 PM  Kevin Wade  has presented today for surgery, with the diagnosis of Chest Pain.  The various methods of treatment have been discussed with the patient and family. After consideration of risks, benefits and other options for treatment, the patient has consented to  Procedure(s): LEFT HEART CATH AND CORONARY ANGIOGRAPHY (N/A) as a surgical intervention.  The patient's history has been reviewed, patient examined, no change in status, stable for surgery.  I have reviewed the patient's chart and labs.  Questions were answered to the patient's satisfaction.     Nicki Guadalajara

## 2020-04-21 NOTE — Progress Notes (Signed)
  Echocardiogram 2D Echocardiogram has been performed.  Delcie Roch 04/21/2020, 5:12 PM

## 2020-04-22 ENCOUNTER — Encounter (HOSPITAL_COMMUNITY): Payer: Self-pay | Admitting: Cardiovascular Disease

## 2020-04-22 DIAGNOSIS — Z79899 Other long term (current) drug therapy: Secondary | ICD-10-CM | POA: Diagnosis not present

## 2020-04-22 DIAGNOSIS — R079 Chest pain, unspecified: Secondary | ICD-10-CM | POA: Diagnosis not present

## 2020-04-22 DIAGNOSIS — I25119 Atherosclerotic heart disease of native coronary artery with unspecified angina pectoris: Secondary | ICD-10-CM | POA: Diagnosis not present

## 2020-04-22 DIAGNOSIS — I2510 Atherosclerotic heart disease of native coronary artery without angina pectoris: Secondary | ICD-10-CM | POA: Diagnosis not present

## 2020-04-22 DIAGNOSIS — Z20822 Contact with and (suspected) exposure to covid-19: Secondary | ICD-10-CM | POA: Diagnosis not present

## 2020-04-22 DIAGNOSIS — I10 Essential (primary) hypertension: Secondary | ICD-10-CM | POA: Diagnosis not present

## 2020-04-22 DIAGNOSIS — Z7982 Long term (current) use of aspirin: Secondary | ICD-10-CM | POA: Diagnosis not present

## 2020-04-22 LAB — CBC
HCT: 39.8 % (ref 39.0–52.0)
Hemoglobin: 13.3 g/dL (ref 13.0–17.0)
MCH: 30.2 pg (ref 26.0–34.0)
MCHC: 33.4 g/dL (ref 30.0–36.0)
MCV: 90.5 fL (ref 80.0–100.0)
Platelets: 156 10*3/uL (ref 150–400)
RBC: 4.4 MIL/uL (ref 4.22–5.81)
RDW: 13.4 % (ref 11.5–15.5)
WBC: 6.4 10*3/uL (ref 4.0–10.5)
nRBC: 0 % (ref 0.0–0.2)

## 2020-04-22 LAB — BASIC METABOLIC PANEL
Anion gap: 9 (ref 5–15)
BUN: 12 mg/dL (ref 8–23)
CO2: 23 mmol/L (ref 22–32)
Calcium: 8.8 mg/dL — ABNORMAL LOW (ref 8.9–10.3)
Chloride: 107 mmol/L (ref 98–111)
Creatinine, Ser: 1.19 mg/dL (ref 0.61–1.24)
GFR, Estimated: >60 mL/min (ref >60)
Glucose, Bld: 84 mg/dL (ref 70–99)
Potassium: 4 mmol/L (ref 3.5–5.1)
Sodium: 139 mmol/L (ref 135–145)

## 2020-04-22 MED ORDER — AMLODIPINE BESYLATE 5 MG PO TABS: 5.0000 mg | ORAL_TABLET | Freq: Every day | ORAL | Status: AC

## 2020-04-22 NOTE — Plan of Care (Signed)
  Problem: Education: Goal: Ability to demonstrate management of disease process will improve Outcome: Progressing Goal: Ability to verbalize understanding of medication therapies will improve Outcome: Progressing   Problem: Activity: Goal: Capacity to carry out activities will improve Outcome: Progressing   Problem: Cardiac: Goal: Ability to achieve and maintain adequate cardiopulmonary perfusion will improve Outcome: Progressing   

## 2020-04-22 NOTE — Progress Notes (Signed)
D/C instructions given and reivewed. Requested changed med be sent to [facility] on [address] in [place], MD notified via secure chat. Tele and IV removed, tolerated well.

## 2020-04-22 NOTE — Care Management Obs Status (Signed)
MEDICARE OBSERVATION STATUS NOTIFICATION   Patient Details  Name: Kevin Wade MRN: [id number] Date of Birth: 1951/03/12   Medicare Observation Status Notification Given:  Yes    Leone Haven, RN 04/22/2020, 11:23 AM

## 2020-04-22 NOTE — Discharge Summary (Addendum)
Discharge Summary    Patient ID: Kevin Wade MRN: [id number]; DOB: 01-03-1951  Admit date: 04/21/2020 Discharge date: 04/22/2020  Primary Care Provider: Alysia Penna, MD  Primary Cardiologist: Thurmon Fair, MD  Primary Electrophysiologist:  None   Discharge Diagnoses    Principal Problem:   Chest pain, patent stent, very mild CAD  Active Problems:   Essential hypertension   Hypercholesterolemia   Bradycardia   Coronary artery disease involving native coronary artery of native heart with angina pectoris [facility])    Diagnostic Studies/Procedures    04/21/20 Cardiac Cath    Mid LAD lesion is 20% stenosed.  Previously placed Mid RCA stent (unknown type) is widely patent.  Prox RCA lesion is 10% stenosed.  The left ventricular systolic function is normal.  LV end diastolic pressure is normal.  The left ventricular ejection fraction is 55-65% by visual estimate.   Normal LV function without wall motion abnormalities; EF 55 to 60%, LVEDP 11 mmHg  Patent stent in the mid right coronary artery with very mild nonobstructive concomitant CAD.  RECOMMENDATION:  Medical therapy.  Suspect nonischemic etiology to the patient's pinprick chest sensation.  Optimal blood pressure control and continuation of aggressive lipid management.  Diagnostic Dominance: Right    Intervention       Echo 04/21/20  IMPRESSIONS    1. Left ventricular ejection fraction, by estimation, is 70 to 75%. The  left ventricle has hyperdynamic function. The left ventricle has no  regional wall motion abnormalities. Left ventricular diastolic parameters  were normal.  2. Right ventricular systolic function is normal. The right ventricular  size is normal.  3. The mitral valve is normal in structure. Trivial mitral valve  regurgitation.  4. The aortic valve is tricuspid. Aortic valve regurgitation is not  visualized. Mild aortic valve sclerosis is present, with no evidence of   aortic valve stenosis.   FINDINGS  Left Ventricle: Left ventricular ejection fraction, by estimation, is 70  to 75%. The left ventricle has hyperdynamic function. The left ventricle  has no regional wall motion abnormalities. The left ventricular internal  cavity size was normal in size.  There is no left ventricular hypertrophy. Left ventricular diastolic  parameters were normal.   Right Ventricle: The right ventricular size is normal. Right vetricular  wall thickness was not assessed. Right ventricular systolic function is  normal.   Left Atrium: Left atrial size was normal in size.   Right Atrium: Right atrial size was normal in size.   Pericardium: There is no evidence of pericardial effusion.   Mitral Valve: The mitral valve is normal in structure. Trivial mitral  valve regurgitation.   Tricuspid Valve: The tricuspid valve is normal in structure. Tricuspid  valve regurgitation is trivial.   Aortic Valve: The aortic valve is tricuspid. Aortic valve regurgitation is  not visualized. Mild aortic valve sclerosis is present, with no evidence  of aortic valve stenosis.   Pulmonic Valve: The pulmonic valve was normal in structure. Pulmonic valve  regurgitation is trivial.   Aorta: The aortic root is normal in size and structure.   IAS/Shunts: The interatrial septum was not assessed.     _____________   History of Present Illness     Kevin Wade is a 69 y.o. male with PMH of HTN, HLD, CAD with NSTEMI s/p PCI/DES to the [facility] and residual LCX disease.  He presented to ER 12.9/21 with  Pin like sensation over lt side of his chest.  He took an ASA and symptoms resolved.  He was then awakened from sleep, he decided against NTg but woke his wife and came to ER  He felt the symptoms resembled symptoms from NSTEMI in 2019 .     In ER BP was elevated with systolic at 173, hs troponin 47 and 45  Cr 1.25  EKG SB at 59 and no acute ST changes from prior EKGs  CXR was NAD,  hgb  14.7    With his hx and elevated troponin and known CAD pt admitted then taken to cath lab.   Hospital Course     Consultants: none   Pt underwent cath as above and found to have patent stent.  Mild CAD otherwise.  Reassuring.  Echo with elevated EF 70-75%, valves stable.     Today pt is doing well without chest pain.  HR at times in the 40s His lipids are well controlled with recent  LDL 64.  Continue statin.  BP today is stable. No longer elevated.  Pt has been seen and evaluated by Dr. Elease Hashimoto and found stable for discharge.     He will follow up in next 3-4 weeks.  He will call if problems or questions prior to that time.   Did the patient have an acute coronary syndrome (MI, NSTEMI, STEMI, etc) this admission?:  No                               Did the patient have a percutaneous coronary intervention (stent / angioplasty)?:  No.       _____________  Discharge Vitals Blood pressure 122/72, pulse (!) 42, temperature 98 F (36.7 C), temperature source Oral, resp. rate 16, height 6' (1.829 m), weight 84.3 kg, SpO2 98 %.  Filed Weights   04/21/20 0313 04/21/20 1525 04/22/20 0304  Weight: 87.5 kg 83.9 kg 84.3 kg    Labs & Radiologic Studies    CBC Recent Labs    04/21/20 1334 04/22/20 0217  WBC 5.5 6.4  HGB 13.6 13.3  HCT 43.3 39.8  MCV 94.7 90.5  PLT 155 156   Basic Metabolic Panel Recent Labs    50/53/97 0319 04/21/20 1334 04/22/20 0217  NA 138  --  139  K 4.1  --  4.0  CL 104  --  107  CO2 24  --  23  GLUCOSE 93  --  84  BUN 18  --  12  CREATININE 1.25* 1.11 1.19  CALCIUM 9.8  --  8.8*   Liver Function Tests No results for input(s): AST, ALT, ALKPHOS, BILITOT, PROT, ALBUMIN in the last 72 hours. No results for input(s): LIPASE, AMYLASE in the last 72 hours. High Sensitivity Troponin:   Recent Labs  Lab 04/21/20 0319 04/21/20 0540  TROPONINIHS 47* 45*    BNP Invalid input(s): POCBNP D-Dimer No results for input(s): DDIMER in the last 72  hours. Hemoglobin A1C No results for input(s): HGBA1C in the last 72 hours. Fasting Lipid Panel No results for input(s): CHOL, HDL, LDLCALC, TRIG, CHOLHDL, LDLDIRECT in the last 72 hours. Thyroid Function Tests No results for input(s): TSH, T4TOTAL, T3FREE, THYROIDAB in the last 72 hours.  Invalid input(s): FREET3 _____________  DG Chest 2 View  Result Date: 04/21/2020 CLINICAL DATA:  Chest pain EXAM: CHEST - 2 VIEW COMPARISON:  None. FINDINGS: The heart size and mediastinal contours are within normal limits. Aortic knob calcifications are seen. Both lungs are clear. The visualized skeletal structures are unremarkable.  IMPRESSION: No active cardiopulmonary disease. Electronically Signed   By: Jonna ClarkBindu  Avutu M.D.   On: 04/21/2020 03:43   CARDIAC CATHETERIZATION  Result Date: 04/21/2020  Mid LAD lesion is 20% stenosed.  Previously placed Mid RCA stent (unknown type) is widely patent.  Prox RCA lesion is 10% stenosed.  The left ventricular systolic function is normal.  LV end diastolic pressure is normal.  The left ventricular ejection fraction is 55-65% by visual estimate.  Normal LV function without wall motion abnormalities; EF 55 to 60%, LVEDP 11 mmHg Patent stent in the mid right coronary artery with very mild nonobstructive concomitant CAD. RECOMMENDATION: Medical therapy.  Suspect nonischemic etiology to the patient's pinprick chest sensation.  Optimal blood pressure control and continuation of aggressive lipid management.   ECHOCARDIOGRAM COMPLETE  Result Date: 04/21/2020    ECHOCARDIOGRAM REPORT   Patient Name:   Kevin Wade Date of Exam: 04/21/2020 Medical Rec #:  [id number]      Height:       72.0 in Accession #:    [id number]     Weight:       185.0 lb Date of Birth:  April 23, 1951      BSA:          2.061 m Patient Age:    69 years       BP:           136/78 mmHg Patient Gender: M              HR:           42 bpm. Exam Location:  Inpatient Procedure: 2D Echo Indications:    chest  pain 786.50  History:        Patient has no prior history of Echocardiogram examinations.                 CAD, Signs/Symptoms:Shortness of Breath; Risk                 Factors:Dyslipidemia and Hypertension.  Sonographer:    Delcie RochLauren Pennington Referring Phys: [phone] LINDSAY B ROBERTS IMPRESSIONS  1. Left ventricular ejection fraction, by estimation, is 70 to 75%. The left ventricle has hyperdynamic function. The left ventricle has no regional wall motion abnormalities. Left ventricular diastolic parameters were normal.  2. Right ventricular systolic function is normal. The right ventricular size is normal.  3. The mitral valve is normal in structure. Trivial mitral valve regurgitation.  4. The aortic valve is tricuspid. Aortic valve regurgitation is not visualized. Mild aortic valve sclerosis is present, with no evidence of aortic valve stenosis. FINDINGS  Left Ventricle: Left ventricular ejection fraction, by estimation, is 70 to 75%. The left ventricle has hyperdynamic function. The left ventricle has no regional wall motion abnormalities. The left ventricular internal cavity size was normal in size. There is no left ventricular hypertrophy. Left ventricular diastolic parameters were normal. Right Ventricle: The right ventricular size is normal. Right vetricular wall thickness was not assessed. Right ventricular systolic function is normal. Left Atrium: Left atrial size was normal in size. Right Atrium: Right atrial size was normal in size. Pericardium: There is no evidence of pericardial effusion. Mitral Valve: The mitral valve is normal in structure. Trivial mitral valve regurgitation. Tricuspid Valve: The tricuspid valve is normal in structure. Tricuspid valve regurgitation is trivial. Aortic Valve: The aortic valve is tricuspid. Aortic valve regurgitation is not visualized. Mild aortic valve sclerosis is present, with no evidence of aortic valve stenosis. Pulmonic Valve: The pulmonic valve was  normal in  structure. Pulmonic valve regurgitation is trivial. Aorta: The aortic root is normal in size and structure. IAS/Shunts: The interatrial septum was not assessed.  LEFT VENTRICLE PLAX 2D LVIDd:         4.90 cm     Diastology LVIDs:         2.70 cm     LV e' medial:    7.83 cm/s LV PW:         0.80 cm     LV E/e' medial:  9.0 LV IVS:        1.00 cm     LV e' lateral:   12.80 cm/s LVOT diam:     2.10 cm     LV E/e' lateral: 5.5 LVOT Area:     3.46 cm  LV Volumes (MOD) LV vol d, MOD A4C: 64.6 ml LV vol s, MOD A4C: 38.0 ml LV SV MOD A4C:     64.6 ml RIGHT VENTRICLE             IVC RV S prime:     11.10 cm/s  IVC diam: 1.40 cm TAPSE (M-mode): 1.6 cm LEFT ATRIUM             Index       RIGHT ATRIUM           Index LA diam:        3.70 cm 1.80 cm/m  RA Area:     13.40 cm LA Vol (A2C):   45.6 ml 22.13 ml/m RA Volume:   29.40 ml  14.27 ml/m LA Vol (A4C):   47.2 ml 22.90 ml/m LA Biplane Vol: 49.7 ml 24.11 ml/m   AORTA Ao Root diam: 3.20 cm Ao Asc diam:  3.00 cm MITRAL VALVE MV Area (PHT): 2.71 cm    SHUNTS MV Decel Time: 280 msec    Systemic Diam: 2.10 cm MV E velocity: 70.70 cm/s MV A velocity: 63.00 cm/s MV E/A ratio:  1.12 Dietrich Pates MD Electronically signed by Dietrich Pates MD Signature Date/Time: 04/21/2020/6:56:07 PM    Final    Disposition   Pt is being discharged home today in good condition.  Follow-up Plans & Appointments   Call [facility] at [phone] if any bleeding, swelling or drainage at cath site.  May shower, no tub baths for 48 hours for groin sticks. No lifting over 5 pounds for 3 days.  No Driving for 3 days  Heart Healthy Diet   Call if questions or problems     Follow-up Information    Croitoru, Mihai, MD Follow up on 05/16/2020.   Specialty: Cardiology Why: at 8:45 AM Please NOTE Virtual appointment by phone or computer.   It will be with Azalee Course, PA    Contact information: [address] [place] [address] [phone]                 Discharge Medications   Allergies as of 04/22/2020   No Known Allergies     Medication List    TAKE these medications   Acetaminophen-Codeine 300-30 MG tablet Take 1 tablet by mouth every 6 (six) hours as needed for pain.   amLODipine 5 MG tablet Commonly known as: NORVASC Take 1 tablet (5 mg total) by mouth at bedtime. What changed: See the new instructions.   aspirin EC 81 MG tablet Take 81 mg by mouth daily.   ezetimibe 10 MG tablet Commonly known as: ZETIA TAKE ONE TABLET  BY MOUTH DAILY   nitroGLYCERIN 0.4 MG SL tablet Commonly known as: NITROSTAT Place 1 tablet (0.4 mg total) under the tongue every 5 (five) minutes x 3 doses as needed for chest pain.   Omega-3 1000 MG Caps Take 1,000 mg by mouth daily.   One-A-Day Mens 50+ Advantage Tabs Take 1 tablet by mouth daily.   Osteo Bi-Flex Joint Shield Tabs Take 1 tablet by mouth daily.   ramipril 10 MG capsule Commonly known as: ALTACE TAKE 1 CAPSULE BY MOUTH EVERY DAY What changed: how much to take   rosuvastatin 40 MG tablet Commonly known as: CRESTOR Take 40 mg by mouth daily.          Outstanding Labs/Studies   none  Duration of Discharge Encounter   Greater than 30 minutes including physician time.  Signed, Nada Boozer, NP 04/22/2020, 10:29 AM   Attending Note:   The patient was seen and examined.  Agree with assessment and plan as noted above.  Changes made to the above note as needed.  Patient seen and independently examined with Nada Boozer, NP .   We discussed all aspects of the encounter. I agree with the assessment and plan as stated above.  1.  Coronary artery disease: The patient presented with symptoms that were very similar to his previous angina equivalent.  Heart catheterization revealed a patent stent.  He has mild coronary artery disease elsewhere.  2.  Hyperlipidemia:   His last cholesterol levels from September reveal a total cholesterol of 142.  The HDL is 62.   LDL 64.  Will be discharged today.  He will follow-up with Dr. Royann Shivers or his APP in the next several weeks.   I have spent a total of 40 minutes with patient reviewing hospital  notes , telemetry, EKGs, labs and examining patient as well as establishing an assessment and plan that was discussed with the patient. > 50% of time was spent in direct patient care.    Vesta Mixer, Montez Hageman., MD, [facility] 04/28/2020, 10:52 AM [address],  [address] Office [phone] Pager [phone]

## 2020-04-22 NOTE — Discharge Instructions (Signed)
Call [facility] at [phone] if any bleeding, swelling or drainage at cath site.  May shower, no tub baths for 48 hours for groin sticks. No lifting over 5 pounds for 3 days.  No Driving for 3 days  Heart Healthy Diet   Call if questions or problems

## 2020-04-22 NOTE — Progress Notes (Signed)
Progress Note  Patient Name: Jasn Xia Date of Encounter: 04/22/2020  [facility] Cardiologist: Thurmon Fair, MD    Subjective   69 year old gentleman with a history of coronary artery disease.  He has a history of a non-ST segment elevation myocardial infarction in July, 2019.  He had drug-eluting stent placed in his mid RCA.  He was noted to have moderate disease in the left circumflex artery.  He presented to the emergency room yesterday with symptoms that were identical to his previous episodes of angina.  Cath yesterday revealed a patent stent. He had mild CAD elsewhere  He feels well and is ready to go   Inpatient Medications    Scheduled Meds: . amLODipine  5 mg Oral QHS  . aspirin  324 mg Oral NOW   Or  . aspirin  300 mg Rectal NOW  . aspirin  81 mg Oral Daily  . enoxaparin (LOVENOX) injection  40 mg Subcutaneous Q24H  . ezetimibe  10 mg Oral Daily  . rosuvastatin  40 mg Oral Daily  . sodium chloride flush  3 mL Intravenous Q12H  . sodium chloride flush  3 mL Intravenous Q12H   Continuous Infusions: . sodium chloride 100 mL/hr at 04/21/20 1629  . sodium chloride     PRN Meds: sodium chloride, acetaminophen, diazepam, nitroGLYCERIN, ondansetron (ZOFRAN) IV, sodium chloride flush   Vital Signs    Vitals:   04/21/20 1956 04/21/20 2338 04/22/20 0304 04/22/20 0733  BP: 122/66 137/77 128/79 122/72  Pulse: (!) 44 (!) 46 (!) 42 (!) 42  Resp: 15 15 14 16   Temp: 98 F (36.7 C) 98.4 F (36.9 C) 98.2 F (36.8 C) 98 F (36.7 C)  TempSrc: Oral Oral Oral Oral  SpO2: 98% 98% 98% 98%  Weight:   84.3 kg   Height:        Intake/Output Summary (Last 24 hours) at 04/22/2020 0947 Last data filed at 04/22/2020 0306 Gross per 24 hour  Intake 3 ml  Output 650 ml  Net -647 ml   Last 3 Weights 04/22/2020 04/21/2020 04/21/2020  Weight (lbs) 185 lb 14.4 oz 184 lb 15.5 oz 193 lb  Weight (kg) 84.324 kg 83.9 kg 87.544 kg      Telemetry     NSR - Personally  Reviewed  ECG     NSR  - Personally Reviewed  Physical Exam   GEN: No acute distress.   Neck: No JVD Cardiac: RRR, no murmurs, rubs, or gallops.  Respiratory: Clear to auscultation bilaterally. GI: Soft, nontender, non-distended  MS: No edema; No deformity. Neuro:  Nonfocal  Psych: Normal affect   Labs    High Sensitivity Troponin:   Recent Labs  Lab 04/21/20 0319 04/21/20 0540  TROPONINIHS 47* 45*      Chemistry Recent Labs  Lab 04/21/20 0319 04/21/20 1334 04/22/20 0217  NA 138  --  139  K 4.1  --  4.0  CL 104  --  107  CO2 24  --  23  GLUCOSE 93  --  84  BUN 18  --  12  CREATININE 1.25* 1.11 1.19  CALCIUM 9.8  --  8.8*  GFRNONAA >60 >60 >60  ANIONGAP 10  --  9     Hematology Recent Labs  Lab 04/21/20 0319 04/21/20 1334 04/22/20 0217  WBC 6.7 5.5 6.4  RBC 4.75 4.57 4.40  HGB 14.7 13.6 13.3  HCT 44.1 43.3 39.8  MCV 92.8 94.7 90.5  MCH 30.9 29.8 30.2  MCHC 33.3 31.4 33.4  RDW 13.4 13.3 13.4  PLT 210 155 156    BNPNo results for input(s): BNP, PROBNP in the last 168 hours.   DDimer No results for input(s): DDIMER in the last 168 hours.   Radiology    DG Chest 2 View  Result Date: 04/21/2020 CLINICAL DATA:  Chest pain EXAM: CHEST - 2 VIEW COMPARISON:  None. FINDINGS: The heart size and mediastinal contours are within normal limits. Aortic knob calcifications are seen. Both lungs are clear. The visualized skeletal structures are unremarkable. IMPRESSION: No active cardiopulmonary disease. Electronically Signed   By: Jonna Clark M.D.   On: 04/21/2020 03:43   CARDIAC CATHETERIZATION  Result Date: 04/21/2020  Mid LAD lesion is 20% stenosed.  Previously placed Mid RCA stent (unknown type) is widely patent.  Prox RCA lesion is 10% stenosed.  The left ventricular systolic function is normal.  LV end diastolic pressure is normal.  The left ventricular ejection fraction is 55-65% by visual estimate.  Normal LV function without wall motion  abnormalities; EF 55 to 60%, LVEDP 11 mmHg Patent stent in the mid right coronary artery with very mild nonobstructive concomitant CAD. RECOMMENDATION: Medical therapy.  Suspect nonischemic etiology to the patient's pinprick chest sensation.  Optimal blood pressure control and continuation of aggressive lipid management.   ECHOCARDIOGRAM COMPLETE  Result Date: 04/21/2020    ECHOCARDIOGRAM REPORT   Patient Name:   TAYVON CULLEY Date of Exam: 04/21/2020 Medical Rec #:  [id number]      Height:       72.0 in Accession #:    [id number]     Weight:       185.0 lb Date of Birth:  [birth year]      BSA:          2.061 m Patient Age:    69 years       BP:           136/78 mmHg Patient Gender: M              HR:           42 bpm. Exam Location:  Inpatient Procedure: 2D Echo Indications:    chest pain 786.50  History:        Patient has no prior history of Echocardiogram examinations.                 CAD, Signs/Symptoms:Shortness of Breath; Risk                 Factors:Dyslipidemia and Hypertension.  Sonographer:    Delcie Roch Referring Phys: [phone] LINDSAY B ROBERTS IMPRESSIONS  1. Left ventricular ejection fraction, by estimation, is 70 to 75%. The left ventricle has hyperdynamic function. The left ventricle has no regional wall motion abnormalities. Left ventricular diastolic parameters were normal.  2. Right ventricular systolic function is normal. The right ventricular size is normal.  3. The mitral valve is normal in structure. Trivial mitral valve regurgitation.  4. The aortic valve is tricuspid. Aortic valve regurgitation is not visualized. Mild aortic valve sclerosis is present, with no evidence of aortic valve stenosis. FINDINGS  Left Ventricle: Left ventricular ejection fraction, by estimation, is 70 to 75%. The left ventricle has hyperdynamic function. The left ventricle has no regional wall motion abnormalities. The left ventricular internal cavity size was normal in size. There is no left ventricular  hypertrophy. Left ventricular diastolic parameters were normal. Right Ventricle: The right ventricular size is normal. Right vetricular wall  thickness was not assessed. Right ventricular systolic function is normal. Left Atrium: Left atrial size was normal in size. Right Atrium: Right atrial size was normal in size. Pericardium: There is no evidence of pericardial effusion. Mitral Valve: The mitral valve is normal in structure. Trivial mitral valve regurgitation. Tricuspid Valve: The tricuspid valve is normal in structure. Tricuspid valve regurgitation is trivial. Aortic Valve: The aortic valve is tricuspid. Aortic valve regurgitation is not visualized. Mild aortic valve sclerosis is present, with no evidence of aortic valve stenosis. Pulmonic Valve: The pulmonic valve was normal in structure. Pulmonic valve regurgitation is trivial. Aorta: The aortic root is normal in size and structure. IAS/Shunts: The interatrial septum was not assessed.  LEFT VENTRICLE PLAX 2D LVIDd:         4.90 cm     Diastology LVIDs:         2.70 cm     LV e' medial:    7.83 cm/s LV PW:         0.80 cm     LV E/e' medial:  9.0 LV IVS:        1.00 cm     LV e' lateral:   12.80 cm/s LVOT diam:     2.10 cm     LV E/e' lateral: 5.5 LVOT Area:     3.46 cm  LV Volumes (MOD) LV vol d, MOD A4C: 64.6 ml LV vol s, MOD A4C: 38.0 ml LV SV MOD A4C:     64.6 ml RIGHT VENTRICLE             IVC RV S prime:     11.10 cm/s  IVC diam: 1.40 cm TAPSE (M-mode): 1.6 cm LEFT ATRIUM             Index       RIGHT ATRIUM           Index LA diam:        3.70 cm 1.80 cm/m  RA Area:     13.40 cm LA Vol (A2C):   45.6 ml 22.13 ml/m RA Volume:   29.40 ml  14.27 ml/m LA Vol (A4C):   47.2 ml 22.90 ml/m LA Biplane Vol: 49.7 ml 24.11 ml/m   AORTA Ao Root diam: 3.20 cm Ao Asc diam:  3.00 cm MITRAL VALVE MV Area (PHT): 2.71 cm    SHUNTS MV Decel Time: 280 msec    Systemic Diam: 2.10 cm MV E velocity: 70.70 cm/s MV A velocity: 63.00 cm/s MV E/A ratio:  1.12 Dietrich PatesPaula Ross MD  Electronically signed by Dietrich PatesPaula Ross MD Signature Date/Time: 04/21/2020/6:56:07 PM    Final     Cardiac Studies      Patient Profile     69 y.o. male with CAD . Admitted with CP similar to his prevoius angina   Assessment & Plan    1.  Coronary artery disease: The patient presented with symptoms that were very similar to his previous angina equivalent.  Heart catheterization revealed a patent stent.  He has mild coronary artery disease elsewhere.  2.  Hyperlipidemia:   His last cholesterol levels from September reveal a total cholesterol of 142.  The HDL is 62.  LDL 64.  Will be discharged today.  He will follow-up with Dr. Royann Shiversroitoru or his APP in the next several weeks.  For questions or updates, please contact [facility] Please consult [email or web address] for contact info under        Signed, Kristeen MissPhilip Lynore Coscia, MD  04/22/2020,  9:47 AM

## 2020-05-05 ENCOUNTER — Other Ambulatory Visit: Payer: Self-pay | Admitting: Cardiovascular Disease

## 2020-05-13 ENCOUNTER — Other Ambulatory Visit: Payer: Self-pay | Admitting: Cardiovascular Disease

## 2020-05-16 ENCOUNTER — Telehealth: Payer: Self-pay

## 2020-05-16 ENCOUNTER — Encounter: Payer: Self-pay | Admitting: Physician Assistant

## 2020-05-16 ENCOUNTER — Telehealth (INDEPENDENT_AMBULATORY_CARE_PROVIDER_SITE_OTHER): Payer: Medicare Other | Admitting: Physician Assistant

## 2020-05-16 DIAGNOSIS — I10 Essential (primary) hypertension: Secondary | ICD-10-CM | POA: Diagnosis not present

## 2020-05-16 DIAGNOSIS — I2510 Atherosclerotic heart disease of native coronary artery without angina pectoris: Secondary | ICD-10-CM | POA: Diagnosis not present

## 2020-05-16 DIAGNOSIS — R001 Bradycardia, unspecified: Secondary | ICD-10-CM | POA: Diagnosis not present

## 2020-05-16 DIAGNOSIS — E785 Hyperlipidemia, unspecified: Secondary | ICD-10-CM

## 2020-05-16 NOTE — Patient Instructions (Signed)
Medication Instructions:  Your physician recommends that you continue on your current medications as directed. Please refer to the Current Medication list given to you today.  *If you need a refill on your cardiac medications before your next appointment, please call your pharmacy*  Lab Work: NONE ordered at this time of appointment   If you have labs (blood work) drawn today and your tests are completely normal, you will receive your results only by: Marland Kitchen MyChart Message (if you have MyChart) OR . A paper copy in the mail If you have any lab test that is abnormal or we need to change your treatment, we will call you to review the results.  Testing/Procedures: NONE ordered at this time of appointment   Follow-Up: At [facility], you and your health needs are our priority.  As part of our continuing mission to provide you with exceptional heart care, we have created designated Provider Care Teams.  These Care Teams include your primary Cardiologist (physician) and Advanced Practice Providers (APPs -  Physician Assistants and Nurse Practitioners) who all work together to provide you with the care you need, when you need it.   Your next appointment:   8 month(s)  The format for your next appointment:   In Person  Provider:   Thurmon Fair, MD  Other Instructions

## 2020-05-16 NOTE — Progress Notes (Signed)
Virtual Visit via Telephone Note   This visit type was conducted due to national recommendations for restrictions regarding the COVID-19 Pandemic (e.g. social distancing) in an effort to limit this patient's exposure and mitigate transmission in our community.  Due to his co-morbid illnesses, this patient is at least at moderate risk for complications without adequate follow up.  This format is felt to be most appropriate for this patient at this time.  The patient did not have access to video technology/had technical difficulties with video requiring transitioning to audio format only (telephone).  All issues noted in this document were discussed and addressed.  No physical exam could be performed with this format.  Please refer to the patient's chart for his  consent to telehealth for [facility].    Date:  05/16/2020   ID:  Kevin Wade, DOB 1951/03/24, MRN [id number] The patient was identified using 2 identifiers.  Patient Location: Home Provider Location: Office/Clinic  PCP:  Lynn Ito, MD  Cardiologist:  Thurmon Fair, MD  Electrophysiologist:  None   Evaluation Performed:  Follow-Up Visit  Chief Complaint:  Follow up  History of Present Illness:    Kevin Wade is a 70 y.o. male with HTN, HLD and CAD.  Patient had NSTEMI in July 2019 and received DES to mid RCA.  He was last seen by Dr. Royann Shivers in September 2021 at which time he was doing well.  More recently, he presented back to the hospital on 04/21/2020 was chest discomfort.  Cardiac catheterization performed on 04/21/2020 showed a 20% mid LAD, 10% proximal RCA, EF 55 to 65%.  Medical therapy was recommended.  Echocardiogram obtained on the same day showed EF 70 to 75%, no significant valve issue.  During the hospitalization, his heart rate occasionally dipping into the 40s.  He is not on any AV nodal blocking agent.  Patient presents today for follow-up.  He denies any further chest pain.  He has no shortness of breath,  lower extremity edema or orthopnea.  He will occasionally notice some pounding sensation, however this appears to be quite transient.  Blood pressure is usually in the 120s to 130s at home.  Heart rate in the 50s.  Despite recent bradycardia in the hospital, he has no dizziness, blurred vision or feeling of passing out.  He is not on any AV nodal blocking agent.  Most recent lipid panel obtained in September 2021 showed very well-controlled cholesterol.  He can follow-up with Dr. Royann Shivers as previously scheduled in September 2022.  The patient does not have symptoms concerning for COVID-19 infection (fever, chills, cough, or new shortness of breath).    Past Medical History:  Diagnosis Date  . Arthritis   . Hyperlipidemia   . Hypertension    Past Surgical History:  Procedure Laterality Date  . APPENDECTOMY    . CORONARY STENT INTERVENTION N/A 11/13/2017   Procedure: CORONARY STENT INTERVENTION;  Surgeon: Tonny Bollman, MD;  Location: [facility] INVASIVE CV LAB;  Service: Cardiovascular;  Laterality: N/A;  . LEFT HEART CATH AND CORONARY ANGIOGRAPHY N/A 11/13/2017   Procedure: LEFT HEART CATH AND CORONARY ANGIOGRAPHY;  Surgeon: Tonny Bollman, MD;  Location: [facility] INVASIVE CV LAB;  Service: Cardiovascular;  Laterality: N/A;  . LEFT HEART CATH AND CORONARY ANGIOGRAPHY N/A 04/21/2020   Procedure: LEFT HEART CATH AND CORONARY ANGIOGRAPHY;  Surgeon: Lennette Bihari, MD;  Location: [facility] INVASIVE CV LAB;  Service: Cardiovascular;  Laterality: N/A;     Current Meds  Medication Sig  . amLODipine (  NORVASC) 5 MG tablet Take 1 tablet (5 mg total) by mouth at bedtime.  Marland Kitchen aspirin EC 81 MG tablet Take 81 mg by mouth daily.  Marland Kitchen ezetimibe (ZETIA) 10 MG tablet TAKE ONE TABLET BY MOUTH DAILY  . Misc Natural Products (OSTEO BI-FLEX JOINT SHIELD) TABS Take 1 tablet by mouth daily.  . Multiple Vitamins-Minerals (ONE-A-DAY MENS 50+ ADVANTAGE) TABS Take 1 tablet by mouth daily.  . nitroGLYCERIN (NITROSTAT) 0.4 MG SL tablet  Place 1 tablet (0.4 mg total) under the tongue every 5 (five) minutes x 3 doses as needed for chest pain.  . ramipril (ALTACE) 10 MG capsule TAKE 1 CAPSULE BY MOUTH EVERY DAY (Patient taking differently: Take 10 mg by mouth daily.)  . rosuvastatin (CRESTOR) 40 MG tablet Take 40 mg by mouth daily.     Allergies:   Patient has no known allergies.   Social History   Tobacco Use  . Smoking status: Never Smoker  . Smokeless tobacco: Never Used  Substance Use Topics  . Alcohol use: Yes    Comment: Socially   . Drug use: No     Family Hx: The patient's family history includes Hypertension in his father.  ROS:   Please see the history of present illness.     All other systems reviewed and are negative.   Prior CV studies:   The following studies were reviewed today:   Cath 04/21/2020  Mid LAD lesion is 20% stenosed.  Previously placed Mid RCA stent (unknown type) is widely patent.  Prox RCA lesion is 10% stenosed.  The left ventricular systolic function is normal.  LV end diastolic pressure is normal.  The left ventricular ejection fraction is 55-65% by visual estimate.   Normal LV function without wall motion abnormalities; EF 55 to 60%, LVEDP 11 mmHg  Patent stent in the mid right coronary artery with very mild nonobstructive concomitant CAD.  RECOMMENDATION:  Medical therapy.  Suspect nonischemic etiology to the patient's pinprick chest sensation.  Optimal blood pressure control and continuation of aggressive lipid management.    Echo 04/21/2020 1. Left ventricular ejection fraction, by estimation, is 70 to 75%. The  left ventricle has hyperdynamic function. The left ventricle has no  regional wall motion abnormalities. Left ventricular diastolic parameters  were normal.  2. Right ventricular systolic function is normal. The right ventricular  size is normal.  3. The mitral valve is normal in structure. Trivial mitral valve  regurgitation.  4. The aortic  valve is tricuspid. Aortic valve regurgitation is not  visualized. Mild aortic valve sclerosis is present, with no evidence of  aortic valve stenosis.   Labs/Other Tests and Data Reviewed:    EKG:  An ECG dated 04/22/2020 was personally reviewed today and demonstrated:  Sinus bradycardia, no significant ST-T wave changes.  Recent Labs: 04/22/2020: BUN 12; Creatinine, Ser 1.19; Hemoglobin 13.3; Platelets 156; Potassium 4.0; Sodium 139   Recent Lipid Panel Lab Results  Component Value Date/Time   CHOL 142 02/10/2020 10:04 AM   TRIG 82 02/10/2020 10:04 AM   HDL 62 02/10/2020 10:04 AM   CHOLHDL 2.3 02/10/2020 10:04 AM   CHOLHDL 3.0 11/14/2017 03:22 AM   LDLCALC 64 02/10/2020 10:04 AM    Wt Readings from Last 3 Encounters:  04/22/20 185 lb 14.4 oz (84.3 kg)  02/10/20 193 lb (87.5 kg)  11/27/18 186 lb (84.4 kg)     Risk Assessment/Calculations:      Objective:    Vital Signs:  There were no vitals  taken for this visit.   VITAL SIGNS:  reviewed  ASSESSMENT & PLAN:    1. CAD: Recent cardiac catheterization showed minimal disease, patent stent in the mid RCA.  Continue aspirin and Crestor  2. Hypertension: Blood pressure was elevated during the last office visit with Dr. Royann Shivers, however blood pressure this morning was in the 120s to 130s.  We will continue on amlodipine and the ramipril  3. Hyperlipidemia: On Zetia and Crestor.  Cholesterol very well controlled based on most recent lab work  4. Asymptomatic bradycardia: Noted during recent hospitalization.  Not on any AV nodal blocking agent.  He denies any dizziness, blurred vision or feeling of passing out.  We will continue monitoring.  Patient is aware to contact cardiology service if he does develop any symptoms.   COVID-19 Education: The signs and symptoms of COVID-19 were discussed with the patient and how to seek care for testing (follow up with PCP or arrange E-visit). The importance of social distancing was  discussed today.  Time:   Today, I have spent 14 minutes with the patient with telehealth technology discussing the above problems.     Medication Adjustments/Labs and Tests Ordered: Current medicines are reviewed at length with the patient today.  Concerns regarding medicines are outlined above.   Tests Ordered: No orders of the defined types were placed in this encounter.   Medication Changes: No orders of the defined types were placed in this encounter.   Follow Up:  In Person in 8 month(s)  Signed, Azalee Course, [place]  05/16/2020 9:22 AM     [facility]

## 2020-05-16 NOTE — Telephone Encounter (Signed)
  Patient Consent for Virtual Visit         Kevin Wade has provided verbal consent on 05/16/2020 for a virtual visit (video or telephone).   CONSENT FOR VIRTUAL VISIT FOR:  Kevin Wade  By participating in this virtual visit I agree to the following:  I hereby voluntarily request, consent and authorize [facility] and its employed or contracted physicians, physician assistants, nurse practitioners or other licensed health care professionals (the Practitioner), to provide me with telemedicine health care services (the "Services") as deemed necessary by the treating Practitioner. I acknowledge and consent to receive the Services by the Practitioner via telemedicine. I understand that the telemedicine visit will involve communicating with the Practitioner through live audiovisual communication technology and the disclosure of certain medical information by electronic transmission. I acknowledge that I have been given the opportunity to request an in-person assessment or other available alternative prior to the telemedicine visit and am voluntarily participating in the telemedicine visit.  I understand that I have the right to withhold or withdraw my consent to the use of telemedicine in the course of my care at any time, without affecting my right to future care or treatment, and that the Practitioner or I may terminate the telemedicine visit at any time. I understand that I have the right to inspect all information obtained and/or recorded in the course of the telemedicine visit and may receive copies of available information for a reasonable fee.  I understand that some of the potential risks of receiving the Services via telemedicine include:  Marland Kitchen Delay or interruption in medical evaluation due to technological equipment failure or disruption; . Information transmitted may not be sufficient (e.g. poor resolution of images) to allow for appropriate medical decision making by the Practitioner;  and/or  . In rare instances, security protocols could fail, causing a breach of personal health information.  Furthermore, I acknowledge that it is my responsibility to provide information about my medical history, conditions and care that is complete and accurate to the best of my ability. I acknowledge that Practitioner's advice, recommendations, and/or decision may be based on factors not within their control, such as incomplete or inaccurate data provided by me or distortions of diagnostic images or specimens that may result from electronic transmissions. I understand that the practice of medicine is not an exact science and that Practitioner makes no warranties or guarantees regarding treatment outcomes. I acknowledge that a copy of this consent can be made available to me via my patient portal [facility] MyChart), or I can request a printed copy by calling the office of [facility].    I understand that my insurance will be billed for this visit.   I have read or had this consent read to me. . I understand the contents of this consent, which adequately explains the benefits and risks of the Services being provided via telemedicine.  . I have been provided ample opportunity to ask questions regarding this consent and the Services and have had my questions answered to my satisfaction. . I give my informed consent for the services to be provided through the use of telemedicine in my medical care

## 2020-06-07 DIAGNOSIS — M7061 Trochanteric bursitis, right hip: Secondary | ICD-10-CM | POA: Diagnosis not present

## 2020-06-07 DIAGNOSIS — M5136 Other intervertebral disc degeneration, lumbar region: Secondary | ICD-10-CM | POA: Diagnosis not present

## 2020-08-05 DIAGNOSIS — Z20822 Contact with and (suspected) exposure to covid-19: Secondary | ICD-10-CM | POA: Diagnosis not present

## 2020-08-07 ENCOUNTER — Other Ambulatory Visit: Payer: Self-pay | Admitting: Cardiovascular Disease

## 2020-09-01 DIAGNOSIS — M5459 Other low back pain: Secondary | ICD-10-CM | POA: Diagnosis not present

## 2020-09-01 DIAGNOSIS — M25512 Pain in left shoulder: Secondary | ICD-10-CM | POA: Diagnosis not present

## 2020-09-14 DIAGNOSIS — M545 Low back pain, unspecified: Secondary | ICD-10-CM | POA: Diagnosis not present

## 2020-09-28 DIAGNOSIS — M545 Low back pain, unspecified: Secondary | ICD-10-CM | POA: Diagnosis not present

## 2020-12-21 DIAGNOSIS — Z20822 Contact with and (suspected) exposure to covid-19: Secondary | ICD-10-CM | POA: Diagnosis not present

## 2021-01-25 DIAGNOSIS — Z125 Encounter for screening for malignant neoplasm of prostate: Secondary | ICD-10-CM | POA: Diagnosis not present

## 2021-01-25 DIAGNOSIS — E785 Hyperlipidemia, unspecified: Secondary | ICD-10-CM | POA: Diagnosis not present

## 2021-01-31 DIAGNOSIS — I2584 Coronary atherosclerosis due to calcified coronary lesion: Secondary | ICD-10-CM | POA: Diagnosis not present

## 2021-01-31 DIAGNOSIS — Z1212 Encounter for screening for malignant neoplasm of rectum: Secondary | ICD-10-CM | POA: Diagnosis not present

## 2021-01-31 DIAGNOSIS — Z23 Encounter for immunization: Secondary | ICD-10-CM | POA: Diagnosis not present

## 2021-01-31 DIAGNOSIS — R7989 Other specified abnormal findings of blood chemistry: Secondary | ICD-10-CM | POA: Diagnosis not present

## 2021-01-31 DIAGNOSIS — Z0000 Encounter for general adult medical examination without abnormal findings: Secondary | ICD-10-CM | POA: Diagnosis not present

## 2021-01-31 DIAGNOSIS — M545 Low back pain, unspecified: Secondary | ICD-10-CM | POA: Diagnosis not present

## 2021-01-31 DIAGNOSIS — Z1331 Encounter for screening for depression: Secondary | ICD-10-CM | POA: Diagnosis not present

## 2021-01-31 DIAGNOSIS — Z1339 Encounter for screening examination for other mental health and behavioral disorders: Secondary | ICD-10-CM | POA: Diagnosis not present

## 2021-01-31 DIAGNOSIS — I10 Essential (primary) hypertension: Secondary | ICD-10-CM | POA: Diagnosis not present

## 2021-01-31 DIAGNOSIS — E785 Hyperlipidemia, unspecified: Secondary | ICD-10-CM | POA: Diagnosis not present

## 2021-01-31 DIAGNOSIS — M7071 Other bursitis of hip, right hip: Secondary | ICD-10-CM | POA: Diagnosis not present

## 2021-02-03 ENCOUNTER — Other Ambulatory Visit: Payer: Self-pay

## 2021-02-03 ENCOUNTER — Other Ambulatory Visit (HOSPITAL_COMMUNITY): Payer: Self-pay | Admitting: Internal Medicine

## 2021-02-03 ENCOUNTER — Ambulatory Visit (HOSPITAL_COMMUNITY)
Admission: RE | Admit: 2021-02-03 | Discharge: 2021-02-03 | Disposition: A | Discharge status: Home / Self Care | Payer: Medicare Other | Source: Ambulatory Visit | Attending: Internal Medicine | Admitting: Internal Medicine

## 2021-02-03 DIAGNOSIS — Z136 Encounter for screening for cardiovascular disorders: Secondary | ICD-10-CM

## 2021-02-03 DIAGNOSIS — Z0000 Encounter for general adult medical examination without abnormal findings: Secondary | ICD-10-CM

## 2021-02-09 DIAGNOSIS — Z23 Encounter for immunization: Secondary | ICD-10-CM | POA: Diagnosis not present

## 2021-02-14 DIAGNOSIS — M7061 Trochanteric bursitis, right hip: Secondary | ICD-10-CM | POA: Diagnosis not present

## 2021-02-14 DIAGNOSIS — M545 Low back pain, unspecified: Secondary | ICD-10-CM | POA: Diagnosis not present

## 2021-02-16 ENCOUNTER — Other Ambulatory Visit: Payer: Self-pay | Admitting: Cardiovascular Disease

## 2021-05-01 DIAGNOSIS — R300 Dysuria: Secondary | ICD-10-CM | POA: Diagnosis not present

## 2021-05-01 DIAGNOSIS — Z1211 Encounter for screening for malignant neoplasm of colon: Secondary | ICD-10-CM | POA: Diagnosis not present

## 2021-05-01 DIAGNOSIS — R319 Hematuria, unspecified: Secondary | ICD-10-CM | POA: Diagnosis not present

## 2021-05-01 DIAGNOSIS — R350 Frequency of micturition: Secondary | ICD-10-CM | POA: Diagnosis not present

## 2021-05-01 DIAGNOSIS — M545 Low back pain, unspecified: Secondary | ICD-10-CM | POA: Diagnosis not present

## 2021-05-01 DIAGNOSIS — N390 Urinary tract infection, site not specified: Secondary | ICD-10-CM | POA: Diagnosis not present

## 2021-05-05 ENCOUNTER — Other Ambulatory Visit: Payer: Self-pay | Admitting: Cardiovascular Disease

## 2021-05-14 ENCOUNTER — Other Ambulatory Visit: Payer: Self-pay | Admitting: Cardiovascular Disease

## 2021-08-14 ENCOUNTER — Other Ambulatory Visit: Payer: Self-pay | Admitting: Cardiovascular Disease

## 2021-08-17 ENCOUNTER — Encounter: Payer: Medicare Other | Admitting: Gastroenterology

## 2021-09-01 ENCOUNTER — Ambulatory Visit: Payer: Medicare Other | Admitting: Gastroenterology

## 2021-10-24 ENCOUNTER — Encounter: Payer: Self-pay | Admitting: Gastroenterology

## 2021-10-24 ENCOUNTER — Ambulatory Visit (INDEPENDENT_AMBULATORY_CARE_PROVIDER_SITE_OTHER): Payer: Medicare Other | Admitting: Gastroenterology

## 2021-10-24 VITALS — BP 118/64 | HR 52 | Ht 72.0 in | Wt 182.2 lb

## 2021-10-24 DIAGNOSIS — Z1211 Encounter for screening for malignant neoplasm of colon: Secondary | ICD-10-CM

## 2021-10-24 DIAGNOSIS — R152 Fecal urgency: Secondary | ICD-10-CM

## 2021-10-24 DIAGNOSIS — Z86010 Personal history of colonic polyps: Secondary | ICD-10-CM

## 2021-10-24 DIAGNOSIS — K5909 Other constipation: Secondary | ICD-10-CM

## 2021-10-24 DIAGNOSIS — R109 Unspecified abdominal pain: Secondary | ICD-10-CM | POA: Diagnosis not present

## 2021-10-24 MED ORDER — NA SULFATE-K SULFATE-MG SULF 17.5-3.13-1.6 GM/177ML PO SOLN: 1.0000 | ORAL | 0 refills | Status: DC

## 2021-10-24 NOTE — Progress Notes (Deleted)
GASTROENTEROLOGY OUTPATIENT CLINIC VISIT   Primary Care Provider Alysia Penna, MD [address] [place] [address] [phone]  Referring Provider Alysia Penna, MD [address] [place],  [place] [address] [phone]  Patient Profile: Kevin Wade is a 71 y.o. male with a pmh significant for  The patient presents to the [facility] for an evaluation and management of problem(s) noted below:  Problem List No diagnosis found.  History of Present Illness    The patient does/does not take NSAIDs or BC/Goody Powder. Patient has/has not had an EGD. Patient has/has not had a Colonoscopy.  GI Review of Systems Positive as above Negative for  Pyrosis; Reflux; Regurgitation; Dysphagia; Odynophagia; Globus; Post-prandial cough; Nocturnal cough; Nasal regurgitation; Epigastric pain; Nausea; Vomiting; Hematemesis; Jaundice; Change in Appetite; Early satiety; Abdominal pain; Abdominal bloating; Eructation; Flatulence; Change in BM Frequency; Change in BM Consistency; Constipation; Diarrhea; Incontinence; Urgency; Tenesmus; Hematochezia; Melena  Review of Systems General: Denies fevers/chills/weight loss/night sweats HEENT: Denies oral lesions/sore throat/headaches/visual changes Cardiovascular: Denies chest pain/palpitations Pulmonary: Denies shortness of breath/cough Gastroenterological: See HPI Genitourinary: Denies darkened urine or hematuria Hematological: Denies easy bruising/bleeding Endocrine: Denies temperature intolerance Dermatological: Denies skin changes Psychological: Mood is stable Allergy & Immunology: Denies severe allergic reactions Musculoskeletal: Denies new arthralgias   Medications Current Outpatient Medications  Medication Sig Dispense Refill   amLODipine (NORVASC) 5 MG tablet Take 1 tablet (5 mg total) by mouth at bedtime.     aspirin EC 81 MG tablet Take 81 mg by mouth daily.     ezetimibe (ZETIA) 10 MG tablet TAKE  ONE TABLET BY MOUTH DAILY *NEED OFFICE VISIT* 30 tablet 0   Multiple Vitamins-Minerals (ONE-A-DAY MENS 50+ ADVANTAGE) TABS Take 1 tablet by mouth daily.     nitroGLYCERIN (NITROSTAT) 0.4 MG SL tablet Place 1 tablet (0.4 mg total) under the tongue every 5 (five) minutes x 3 doses as needed for chest pain. 20 tablet 0   ramipril (ALTACE) 10 MG capsule TAKE 1 CAPSULE BY MOUTH EVERY DAY 90 capsule 2   rosuvastatin (CRESTOR) 40 MG tablet Take 40 mg by mouth daily.     No current facility-administered medications for this visit.    Allergies No Known Allergies  Histories Past Medical History:  Diagnosis Date   Arthritis    Hyperlipidemia    Hypertension    Past Surgical History:  Procedure Laterality Date   APPENDECTOMY     CORONARY STENT INTERVENTION N/A 11/13/2017   Procedure: CORONARY STENT INTERVENTION;  Surgeon: Tonny Bollman, MD;  Location: [facility] INVASIVE CV LAB;  Service: Cardiovascular;  Laterality: N/A;   LEFT HEART CATH AND CORONARY ANGIOGRAPHY N/A 11/13/2017   Procedure: LEFT HEART CATH AND CORONARY ANGIOGRAPHY;  Surgeon: Tonny Bollman, MD;  Location: [facility] INVASIVE CV LAB;  Service: Cardiovascular;  Laterality: N/A;   LEFT HEART CATH AND CORONARY ANGIOGRAPHY N/A 04/21/2020   Procedure: LEFT HEART CATH AND CORONARY ANGIOGRAPHY;  Surgeon: Lennette Bihari, MD;  Location: [facility] INVASIVE CV LAB;  Service: Cardiovascular;  Laterality: N/A;   ROTATOR CUFF REPAIR Right    Dr. Ave Filter   Social History   Socioeconomic History   Marital status: Married    Spouse name: Not on file   Number of children: Not on file   Years of education: Not on file   Highest education level: Not on file  Occupational History   Not on file  Tobacco Use   Smoking status: Never   Smokeless tobacco: Never  Substance and Sexual Activity  Alcohol use: Yes    Comment: Socially    Drug use: No   Sexual activity: Not on file  Other Topics Concern   Not on file  Social History Narrative   Not on file    Social Determinants of Health   Financial Resource Strain: Not on file  Food Insecurity: Not on file  Transportation Needs: Not on file  Physical Activity: Not on file  Stress: Not on file  Social Connections: Not on file  Intimate Partner Violence: Not on file   Family History  Problem Relation Age of Onset   Hypertension Father    I have reviewed his medical, social, and family history in detail and updated the electronic medical record as necessary.    PHYSICAL EXAMINATION  There were no vitals taken for this visit. Wt Readings from Last 3 Encounters:  04/22/20 185 lb 14.4 oz (84.3 kg)  02/10/20 193 lb (87.5 kg)  11/27/18 186 lb (84.4 kg)   GEN: NAD, appears stated age, doesn't appear chronically ill PSYCH: Cooperative, without pressured speech EYE: Conjunctivae pink, sclerae anicteric ENT: MMM, without oral ulcers, no erythema or exudates noted NECK: Supple CV: RR without R/Gs  RESP: CTAB posteriorly, without wheezing GI: NABS, soft, NT/ND, without rebound or guarding, no HSM appreciated GU: DRE shows MSK/EXT: _ edema, no palmar erythema SKIN: No jaundice, no spider angiomata, no concerning rashes NEURO:  Alert & Oriented x 3, no focal deficits, no evidence of asterixis   REVIEW OF DATA  I reviewed the following data at the time of this encounter:  GI Procedures and Studies  ***  Laboratory Studies  ***  Imaging Studies  ***   ASSESSMENT  [name] is a 71 y.o. male with a pmh significant for The patient is seen today for evaluation and management of:  No diagnosis found.  ***   PLAN  There are no diagnoses linked to this encounter.   No orders of the defined types were placed in this encounter.   New Prescriptions   No medications on file   Modified Medications   No medications on file    Planned Follow Up No follow-ups on file.   Total Time in Face-to-Face and in Coordination of Care for patient including independent/personal  interpretation/review of prior testing, medical history, examination, medication adjustment, communicating results with the patient directly, and documentation within the EHR is ***.   Corliss Parish, MD [facility] Advanced [facility]oscopy Office # [phone]

## 2021-10-24 NOTE — Patient Instructions (Addendum)
You have been scheduled for a colonoscopy. Please follow written instructions given to you at your visit today.  Please pick up your prep supplies at the pharmacy within the next 1-3 days. If you use inhalers (even only as needed), please bring them with you on the day of your procedure.  We have sent the following medications to your pharmacy for you to pick up at your convenience: Suprep   If you are age 72 or older, your body mass index should be between 23-30. Your Body mass index is 24.71 kg/m. If this is out of the aforementioned range listed, please consider follow up with your Primary Care Provider.  If you are age 44 or younger, your body mass index should be between 19-25. Your Body mass index is 24.71 kg/m. If this is out of the aformentioned range listed, please consider follow up with your Primary Care Provider.   ________________________________________________________  The [place] providers would like to encourage you to use [facility] to communicate with providers for non-urgent requests or questions.  Due to long hold times on the telephone, sending your provider a message by [facility] may be a faster and more efficient way to get a response.  Please allow 48 business hours for a response.  Please remember that this is for non-urgent requests.  _______________________________________________________  Thank you for choosing me and [facility].  Dr. Rush Landmark

## 2021-10-26 ENCOUNTER — Encounter: Payer: Self-pay | Admitting: Gastroenterology

## 2021-10-26 ENCOUNTER — Ambulatory Visit (AMBULATORY_SURGERY_CENTER): Payer: Medicare Other | Admitting: Gastroenterology

## 2021-10-26 VITALS — BP 113/63 | HR 39 | Temp 96.9°F | Resp 17 | Ht 72.0 in | Wt 182.0 lb

## 2021-10-26 DIAGNOSIS — Z09 Encounter for follow-up examination after completed treatment for conditions other than malignant neoplasm: Secondary | ICD-10-CM

## 2021-10-26 DIAGNOSIS — Z86010 Personal history of colonic polyps: Secondary | ICD-10-CM | POA: Diagnosis not present

## 2021-10-26 DIAGNOSIS — Z1211 Encounter for screening for malignant neoplasm of colon: Secondary | ICD-10-CM | POA: Insufficient documentation

## 2021-10-26 DIAGNOSIS — I2510 Atherosclerotic heart disease of native coronary artery without angina pectoris: Secondary | ICD-10-CM | POA: Diagnosis not present

## 2021-10-26 DIAGNOSIS — K5909 Other constipation: Secondary | ICD-10-CM | POA: Insufficient documentation

## 2021-10-26 DIAGNOSIS — R109 Unspecified abdominal pain: Secondary | ICD-10-CM | POA: Insufficient documentation

## 2021-10-26 DIAGNOSIS — R152 Fecal urgency: Secondary | ICD-10-CM | POA: Insufficient documentation

## 2021-10-26 MED ORDER — SODIUM CHLORIDE 0.9 % IV SOLN: 500.0000 mL | INTRAVENOUS | Status: DC

## 2021-10-26 NOTE — Op Note (Signed)
[place] Patient Name: Kevin Wade Procedure Date: 10/26/2021 1:34 PM MRN: [id number] Endoscopist: Justice Britain , MD Age: 71 Referring MD:  Date of Birth: 1950-06-29 Gender: Male Account #: [id number] Procedure:                Colonoscopy Indications:              Screening for colorectal malignant neoplasm, High                            risk colon cancer surveillance: Personal history of                            colonic polyps (unclear pathology as it was when he                            was in [place]), Urgency Medicines:                Monitored Anesthesia Care Procedure:                Pre-Anesthesia Assessment:                           - Prior to the procedure, a History and Physical                            was performed, and patient medications and                            allergies were reviewed. The patient's tolerance of                            previous anesthesia was also reviewed. The risks                            and benefits of the procedure and the sedation                            options and risks were discussed with the patient.                            All questions were answered, and informed consent                            was obtained. Prior Anticoagulants: The patient has                            taken no previous anticoagulant or antiplatelet                            agents except for aspirin. ASA Grade Assessment:                            III - A patient with severe systemic disease. After  reviewing the risks and benefits, the patient was                            deemed in satisfactory condition to undergo the                            procedure.                           After obtaining informed consent, the colonoscope                            was passed under direct vision. Throughout the                            procedure, the patient's blood pressure, pulse, and                             oxygen saturations were monitored continuously. The                            CF [id number] #[id number] was introduced through the anus                            and advanced to the 5 cm into the ileum. The                            colonoscopy was performed without difficulty. The                            patient tolerated the procedure. The quality of the                            bowel preparation was adequate. The terminal ileum,                            ileocecal valve, appendiceal orifice, and rectum                            were photographed. Scope In: 1:39:24 PM Scope Out: 1:52:55 PM Scope Withdrawal Time: 0 hours 10 minutes 58 seconds  Total Procedure Duration: 0 hours 13 minutes 31 seconds  Findings:                 The digital rectal exam findings include                            hemorrhoids. Pertinent negatives include no                            palpable rectal lesions.                           The terminal ileum and ileocecal valve appeared  normal.                           Normal mucosa was found in the entire colon.                           Non-bleeding non-thrombosed external and internal                            hemorrhoids were found during retroflexion, during                            perianal exam and during digital exam. The                            hemorrhoids were Grade II (internal hemorrhoids                            that prolapse but reduce spontaneously). Complications:            No immediate complications. Estimated Blood Loss:     Estimated blood loss: none. Impression:               - Hemorrhoids found on digital rectal exam.                           - The examined portion of the ileum was normal.                           - Normal mucosa in the entire examined colon.                           - Non-bleeding non-thrombosed external and internal                             hemorrhoids. Recommendation:           - The patient will be observed post-procedure,                            until all discharge criteria are met.                           - Discharge patient to home.                           - Patient has a contact number available for                            emergencies. The signs and symptoms of potential                            delayed complications were discussed with the                            patient. Return to normal activities tomorrow.  Written discharge instructions were provided to the                            patient.                           - High fiber diet.                           - Use FiberCon 1-2 tablets PO daily.                           - Continue present medications.                           - Repeat colonoscopy in 7/10 years for                            screening/surveillance purposes if health remains                            in good condition.                           - If abdominal discomforts persist/worsen or                            patient develops weight loss, will need to consider                            cross-sectional imaging.                           - The findings and recommendations were discussed                            with the patient.                           - The findings and recommendations were discussed                            with the patient's family. Justice Britain, MD 10/26/2021 2:03:19 PM

## 2021-10-26 NOTE — Progress Notes (Addendum)
GASTROENTEROLOGY OUTPATIENT CLINIC VISIT   Primary Care Provider Alysia Penna, MD [address] [place] [address] [phone]  Referring Provider Alysia Penna, MD [address] [place],  [place] [address] [phone]  Patient Profile: Kevin Wade is a 71 y.o. male with a pmh significant for CAD (status post PCI), asymptomatic bradycardia, hypertension, hyperlipidemia, arthritis, status post appendectomy, colon polyps.  The patient presents to the [facility] for an evaluation and management of problem(s) noted below:  Problem List 1. Colon cancer screening   2. History of colon polyps   3. Fecal urgency   4. Abdominal discomfort   5. Chronic constipation     History of Present Illness This is the patient's first visit to the outpatient  GI clinic.  The patient is accompanied by his wife.  Patient describes previous history of polyps on colonoscopy when he was in [place].  Patient has bowel movements on every other day basis to every day.  However there are periods of time where he will have a fecal urgency and then have some abdominal discomfort that develops in the mid abdomen.  Within a few minutes the discomfort will let up and this can occur if he has a bowel movement or if he does not have a bowel movement.  He does use Metamucil regularly.  He has never had an upper endoscopy.  Denies any issues of dysphagia or odynophagia.  GI Review of Systems Positive as above Negative for nausea, vomiting, melena, hematochezia  Review of Systems General: Denies fevers/chills/weight loss unintentionally Cardiovascular: Denies chest pain Pulmonary: Denies shortness of breath Gastroenterological: See HPI Genitourinary: Denies darkened urine Hematological: Denies easy bruising/bleeding Endocrine: Denies temperature intolerance Dermatological: Denies jaundice Psychological: Mood is stable   Medications Current Outpatient Medications   Medication Sig Dispense Refill   amLODipine (NORVASC) 5 MG tablet Take 1 tablet (5 mg total) by mouth at bedtime.     aspirin EC 81 MG tablet Take 81 mg by mouth daily.     ezetimibe (ZETIA) 10 MG tablet TAKE ONE TABLET BY MOUTH DAILY *NEED OFFICE VISIT* 30 tablet 0   Multiple Vitamins-Minerals (ONE-A-DAY MENS 50+ ADVANTAGE) TABS Take 1 tablet by mouth daily. (Patient not taking: Reported on 10/26/2021)     nitroGLYCERIN (NITROSTAT) 0.4 MG SL tablet Place 1 tablet (0.4 mg total) under the tongue every 5 (five) minutes x 3 doses as needed for chest pain. (Patient not taking: Reported on 10/26/2021) 20 tablet 0   ramipril (ALTACE) 10 MG capsule TAKE 1 CAPSULE BY MOUTH EVERY DAY 90 capsule 2   rosuvastatin (CRESTOR) 40 MG tablet Take 40 mg by mouth daily.     No current facility-administered medications for this visit.    Allergies No Known Allergies  Histories Past Medical History:  Diagnosis Date   Arthritis    Hyperlipidemia    Hypertension    Past Surgical History:  Procedure Laterality Date   APPENDECTOMY     CORONARY STENT INTERVENTION N/A 11/13/2017   Procedure: CORONARY STENT INTERVENTION;  Surgeon: Tonny Bollman, MD;  Location: [facility] INVASIVE CV LAB;  Service: Cardiovascular;  Laterality: N/A;   LEFT HEART CATH AND CORONARY ANGIOGRAPHY N/A 11/13/2017   Procedure: LEFT HEART CATH AND CORONARY ANGIOGRAPHY;  Surgeon: Tonny Bollman, MD;  Location: [facility] INVASIVE CV LAB;  Service: Cardiovascular;  Laterality: N/A;   LEFT HEART CATH AND CORONARY ANGIOGRAPHY N/A 04/21/2020   Procedure: LEFT HEART CATH AND CORONARY ANGIOGRAPHY;  Surgeon: Lennette Bihari, MD;  Location: [facility] INVASIVE CV  LAB;  Service: Cardiovascular;  Laterality: N/A;   ROTATOR CUFF REPAIR Right    Dr. Ave Filter   Social History   Socioeconomic History   Marital status: Married    Spouse name: Not on file   Number of children: Not on file   Years of education: Not on file   Highest education level: Not on file   Occupational History   Not on file  Tobacco Use   Smoking status: Never   Smokeless tobacco: Never  Substance and Sexual Activity   Alcohol use: Yes    Comment: Socially    Drug use: No   Sexual activity: Not on file  Other Topics Concern   Not on file  Social History Narrative   Not on file   Social Determinants of Health   Financial Resource Strain: Not on file  Food Insecurity: Not on file  Transportation Needs: Not on file  Physical Activity: Not on file  Stress: Not on file  Social Connections: Not on file  Intimate Partner Violence: Not on file   Family History  Problem Relation Age of Onset   Hypertension Father    Colon cancer Neg Hx    Rectal cancer Neg Hx    Stomach cancer Neg Hx    Esophageal cancer Neg Hx    Inflammatory bowel disease Neg Hx    Liver disease Neg Hx    Pancreatic cancer Neg Hx    I have reviewed his medical, social, and family history in detail and updated the electronic medical record as necessary.    PHYSICAL EXAMINATION  BP 118/64   Pulse (!) 52   Ht 6' (1.829 m)   Wt 182 lb 3.2 oz (82.6 kg)   BMI 24.71 kg/m  Wt Readings from Last 3 Encounters:  10/26/21 182 lb (82.6 kg)  10/24/21 182 lb 3.2 oz (82.6 kg)  04/22/20 185 lb 14.4 oz (84.3 kg)  GEN: NAD, appears stated age, doesn't appear chronically ill, accompanied by wife PSYCH: Cooperative, without pressured speech EYE: Conjunctivae pink, sclerae anicteric ENT: MMM CV: Nontachycardic RESP: No audible wheezing GI: NABS, soft, NT/ND, without rebound MSK/EXT: No lower extremity edema SKIN: No jaundice NEURO:  Alert & Oriented x 3, no focal deficits   REVIEW OF DATA  I reviewed the following data at the time of this encounter:  GI Procedures and Studies  Patient reports colonoscopy in [place] within the last 5 to 7 years with colon polyps  Laboratory Studies  Reviewed those in epic  Imaging Studies  No relevant studies to review   ASSESSMENT  Mr. Glace is a 71  y.o. male with a pmh significant for CAD (status post PCI), asymptomatic bradycardia, hypertension, hyperlipidemia, arthritis, status post appendectomy, colon polyps.  The patient is seen today for evaluation and management of:  1. Colon cancer screening   2. History of colon polyps   3. Fecal urgency   4. Abdominal discomfort   5. Chronic constipation    The patient is clinically and hemodynamically stable.  He has a history of colon polyps, although overt pathology is not known he was told he should have follow-up in a less than 10-year surveillance potentially 5 to 7 years.  He would be due at this point.  Patient also experiencing issues of fecal urgency that does not always improve with a bowel movement but cannot.  He has an abdominal discomfort that makes him uneasy at times.  He has not had cross-sectional imaging.  We will plan  to proceed with surveillance colonoscopy to ensure no mass or lesion is noted and get him up-to-date from a colon cancer screening standpoint.  If issues persist without a clear etiology and with transition of his fiber supplementation, then we will consider cross-sectional imaging of the abdomen and pelvis.  The risks and benefits of endoscopic evaluation were discussed with the patient; these include but are not limited to the risk of perforation, infection, bleeding, missed lesions, lack of diagnosis, severe illness requiring hospitalization, as well as anesthesia and sedation related illnesses.  The patient and/or family is agreeable to proceed.   All patient questions were answered to the best of my ability, and the patient agrees to the aforementioned plan of action with follow-up as indicated.   PLAN  Proceed with scheduling surveillance colonoscopy Metamucil okay to maintain on a daily basis versus transition to FiberCon daily Stay hydrated with at least 64 to 80 ounces of fluid per day If no evidence of abnormality in the colon and patient continues to have  intra-abdominal discomfort, will consider cross-sectional imaging in the future   Orders Placed This Encounter  Procedures   Ambulatory referral to Gastroenterology    New Prescriptions   No medications on file   Modified Medications   No medications on file    Planned Follow Up No follow-ups on file.   Total Time in Face-to-Face and in Coordination of Care for patient including independent/personal interpretation/review of prior testing, medical history, examination, medication adjustment, communicating results with the patient directly, and documentation within the EHR is 45 minutes.   Corliss Parish, MD  Gastroenterology Advanced Endoscopy Office # [phone]

## 2021-10-26 NOTE — Progress Notes (Incomplete Revision)
GASTROENTEROLOGY OUTPATIENT CLINIC VISIT   Primary Care Provider Alysia Penna, MD [address] [place] [address] [phone]  Referring Provider Alysia Penna, MD [address] [place],  [place] [address] [phone]  Patient Profile: Phares Zaccone is a 71 y.o. male with a pmh significant for CAD (status post PCI), asymptomatic bradycardia, hypertension, hyperlipidemia, arthritis, status post appendectomy, colon polyps.  The patient presents to the [facility] for an evaluation and management of problem(s) noted below:  Problem List 1. Colon cancer screening   2. History of colon polyps   3. Fecal urgency   4. Abdominal discomfort     History of Present Illness This is the patient's first visit to the outpatient [place] GI clinic.   The patient does/does not take NSAIDs or BC/Goody Powder. Patient has/has not had an EGD. Patient has/has not had a Colonoscopy.  GI Review of Systems Positive as above Negative for  Pyrosis; Reflux; Regurgitation; Dysphagia; Odynophagia; Globus; Post-prandial cough; Nocturnal cough; Nasal regurgitation; Epigastric pain; Nausea; Vomiting; Hematemesis; Jaundice; Change in Appetite; Early satiety; Abdominal pain; Abdominal bloating; Eructation; Flatulence; Change in BM Frequency; Change in BM Consistency; Constipation; Diarrhea; Incontinence; Urgency; Tenesmus; Hematochezia; Melena  Review of Systems General: Denies fevers/chills/weight loss/night sweats HEENT: Denies oral lesions/sore throat/headaches/visual changes Cardiovascular: Denies chest pain/palpitations Pulmonary: Denies shortness of breath/cough Gastroenterological: See HPI Genitourinary: Denies darkened urine or hematuria Hematological: Denies easy bruising/bleeding Endocrine: Denies temperature intolerance Dermatological: Denies skin changes Psychological: Mood is stable Allergy & Immunology: Denies severe allergic reactions Musculoskeletal:  Denies new arthralgias   Medications Current Outpatient Medications  Medication Sig Dispense Refill   amLODipine (NORVASC) 5 MG tablet Take 1 tablet (5 mg total) by mouth at bedtime.     aspirin EC 81 MG tablet Take 81 mg by mouth daily.     ezetimibe (ZETIA) 10 MG tablet TAKE ONE TABLET BY MOUTH DAILY *NEED OFFICE VISIT* 30 tablet 0   Multiple Vitamins-Minerals (ONE-A-DAY MENS 50+ ADVANTAGE) TABS Take 1 tablet by mouth daily. (Patient not taking: Reported on 10/26/2021)     nitroGLYCERIN (NITROSTAT) 0.4 MG SL tablet Place 1 tablet (0.4 mg total) under the tongue every 5 (five) minutes x 3 doses as needed for chest pain. (Patient not taking: Reported on 10/26/2021) 20 tablet 0   ramipril (ALTACE) 10 MG capsule TAKE 1 CAPSULE BY MOUTH EVERY DAY 90 capsule 2   rosuvastatin (CRESTOR) 40 MG tablet Take 40 mg by mouth daily.     No current facility-administered medications for this visit.    Allergies No Known Allergies  Histories Past Medical History:  Diagnosis Date   Arthritis    Hyperlipidemia    Hypertension    Past Surgical History:  Procedure Laterality Date   APPENDECTOMY     CORONARY STENT INTERVENTION N/A 11/13/2017   Procedure: CORONARY STENT INTERVENTION;  Surgeon: Tonny Bollman, MD;  Location: [facility] INVASIVE CV LAB;  Service: Cardiovascular;  Laterality: N/A;   LEFT HEART CATH AND CORONARY ANGIOGRAPHY N/A 11/13/2017   Procedure: LEFT HEART CATH AND CORONARY ANGIOGRAPHY;  Surgeon: Tonny Bollman, MD;  Location: [facility] INVASIVE CV LAB;  Service: Cardiovascular;  Laterality: N/A;   LEFT HEART CATH AND CORONARY ANGIOGRAPHY N/A 04/21/2020   Procedure: LEFT HEART CATH AND CORONARY ANGIOGRAPHY;  Surgeon: Lennette Bihari, MD;  Location: [facility] INVASIVE CV LAB;  Service: Cardiovascular;  Laterality: N/A;   ROTATOR CUFF REPAIR Right    Dr. Ave Filter   Social History   Socioeconomic History   Marital status: Married  Spouse name: Not on file   Number of children: Not on file   Years  of education: Not on file   Highest education level: Not on file  Occupational History   Not on file  Tobacco Use   Smoking status: Never   Smokeless tobacco: Never  Substance and Sexual Activity   Alcohol use: Yes    Comment: Socially    Drug use: No   Sexual activity: Not on file  Other Topics Concern   Not on file  Social History Narrative   Not on file   Social Determinants of Health   Financial Resource Strain: Not on file  Food Insecurity: Not on file  Transportation Needs: Not on file  Physical Activity: Not on file  Stress: Not on file  Social Connections: Not on file  Intimate Partner Violence: Not on file   Family History  Problem Relation Age of Onset   Hypertension Father    Colon cancer Neg Hx    Rectal cancer Neg Hx    Stomach cancer Neg Hx    I have reviewed his medical, social, and family history in detail and updated the electronic medical record as necessary.    PHYSICAL EXAMINATION  BP 118/64   Pulse (!) 52   Ht 6' (1.829 m)   Wt 182 lb 3.2 oz (82.6 kg)   BMI 24.71 kg/m  Wt Readings from Last 3 Encounters:  10/26/21 182 lb (82.6 kg)  10/24/21 182 lb 3.2 oz (82.6 kg)  04/22/20 185 lb 14.4 oz (84.3 kg)   GEN: NAD, appears stated age, doesn't appear chronically ill PSYCH: Cooperative, without pressured speech EYE: Conjunctivae pink, sclerae anicteric ENT: MMM, without oral ulcers, no erythema or exudates noted NECK: Supple CV: RR without R/Gs  RESP: CTAB posteriorly, without wheezing GI: NABS, soft, NT/ND, without rebound or guarding, no HSM appreciated GU: DRE shows MSK/EXT: _ edema, no palmar erythema SKIN: No jaundice, no spider angiomata, no concerning rashes NEURO:  Alert & Oriented x 3, no focal deficits, no evidence of asterixis   REVIEW OF DATA  I reviewed the following data at the time of this encounter:  GI Procedures and Studies  ***  Laboratory Studies  ***  Imaging Studies  ***   ASSESSMENT  Mr. Vandevelde is a 71  y.o. male with a pmh significant for The patient is seen today for evaluation and management of:  1. Colon cancer screening   2. History of colon polyps   3. Fecal urgency   4. Abdominal discomfort     ***   PLAN  1. Colon cancer screening *** - Ambulatory referral to Gastroenterology  2. History of colon polyps *** - Ambulatory referral to Gastroenterology    Orders Placed This Encounter  Procedures   Ambulatory referral to Gastroenterology    New Prescriptions   No medications on file   Modified Medications   No medications on file    Planned Follow Up No follow-ups on file.   Total Time in Face-to-Face and in Coordination of Care for patient including independent/personal interpretation/review of prior testing, medical history, examination, medication adjustment, communicating results with the patient directly, and documentation within the EHR is ***.   Corliss Parish, MD [facility] [facility] Advanced Endoscopy Office # [phone]

## 2021-10-26 NOTE — Patient Instructions (Signed)
Handouts on high fiber diet and hemorrhoids given to you today  Use fibercon 1-2 tablets a day to keep bowl movements regular. Drink plenty of water.    YOU HAD AN ENDOSCOPIC PROCEDURE TODAY AT THE [facility]:   Refer to the procedure report that was given to you for any specific questions about what was found during the examination.  If the procedure report does not answer your questions, please call your gastroenterologist to clarify.  If you requested that your care partner not be given the details of your procedure findings, then the procedure report has been included in a sealed envelope for you to review at your convenience later.  YOU SHOULD EXPECT: Some feelings of bloating in the abdomen. Passage of more gas than usual.  Walking can help get rid of the air that was put into your GI tract during the procedure and reduce the bloating. If you had a lower endoscopy (such as a colonoscopy or flexible sigmoidoscopy) you may notice spotting of blood in your stool or on the toilet paper. If you underwent a bowel prep for your procedure, you may not have a normal bowel movement for a few days.  Please Note:  You might notice some irritation and congestion in your nose or some drainage.  This is from the oxygen used during your procedure.  There is no need for concern and it should clear up in a day or so.  SYMPTOMS TO REPORT IMMEDIATELY:  Following lower endoscopy (colonoscopy or flexible sigmoidoscopy):  Excessive amounts of blood in the stool  Significant tenderness or worsening of abdominal pains  Swelling of the abdomen that is new, acute  Fever of 100F or higher  For urgent or emergent issues, a gastroenterologist can be reached at any hour by calling [phone]. Do not use MyChart messaging for urgent concerns.    DIET:  We do recommend a small meal at first, but then you may proceed to your regular diet.  Drink plenty of fluids but you should avoid alcoholic beverages  for 24 hours.  ACTIVITY:  You should plan to take it easy for the rest of today and you should NOT DRIVE or use heavy machinery until tomorrow (because of the sedation medicines used during the test).    FOLLOW UP: Our staff will call the number listed on your records 24-72 hours following your procedure to check on you and address any questions or concerns that you may have regarding the information given to you following your procedure. If we do not reach you, we will leave a message.  We will attempt to reach you two times.  During this call, we will ask if you have developed any symptoms of COVID 19. If you develop any symptoms (ie: fever, flu-like symptoms, shortness of breath, cough etc.) before then, please call [phone].  If you test positive for Covid 19 in the 2 weeks post procedure, please call and report this information to [place].    SIGNATURES/CONFIDENTIALITY: You and/or your care partner have signed paperwork which will be entered into your electronic medical record.  These signatures attest to the fact that that the information above on your After Visit Summary has been reviewed and is understood.  Full responsibility of the confidentiality of this discharge information lies with you and/or your care-partner.

## 2021-10-26 NOTE — Progress Notes (Signed)
GASTROENTEROLOGY PROCEDURE H&P NOTE   Primary Care Physician: Velna Hatchet, MD  HPI: Kevin Wade is a 71 y.o. male who presents for colonoscopy for screening.  Past Medical History:  Diagnosis Date   Arthritis    Hyperlipidemia    Hypertension    Past Surgical History:  Procedure Laterality Date   APPENDECTOMY     CORONARY STENT INTERVENTION N/A 11/13/2017   Procedure: CORONARY STENT INTERVENTION;  Surgeon: Sherren Mocha, MD;  Location: [place] [facility];  Service: Cardiovascular;  Laterality: N/A;   LEFT HEART CATH AND CORONARY ANGIOGRAPHY N/A 11/13/2017   Procedure: LEFT HEART CATH AND CORONARY ANGIOGRAPHY;  Surgeon: Sherren Mocha, MD;  Location: [place] [facility];  Service: Cardiovascular;  Laterality: N/A;   LEFT HEART CATH AND CORONARY ANGIOGRAPHY N/A 04/21/2020   Procedure: LEFT HEART CATH AND CORONARY ANGIOGRAPHY;  Surgeon: Troy Sine, MD;  Location: [place] [facility];  Service: Cardiovascular;  Laterality: N/A;   ROTATOR CUFF REPAIR Right    Dr. Tamera Punt   Current Outpatient Medications  Medication Sig Dispense Refill   amLODipine (NORVASC) 5 MG tablet Take 1 tablet (5 mg total) by mouth at bedtime.     aspirin EC 81 MG tablet Take 81 mg by mouth daily.     ezetimibe (ZETIA) 10 MG tablet TAKE ONE TABLET BY MOUTH DAILY *NEED OFFICE VISIT* 30 tablet 0   Multiple Vitamins-Minerals (ONE-A-DAY MENS 50+ ADVANTAGE) TABS Take 1 tablet by mouth daily.     Na Sulfate-K Sulfate-Mg Sulf (SUPREP BOWEL PREP KIT) 17.5-3.13-1.6 GM/177ML SOLN Take 1 kit by mouth as directed. For colonoscopy prep 354 mL 0   nitroGLYCERIN (NITROSTAT) 0.4 MG SL tablet Place 1 tablet (0.4 mg total) under the tongue every 5 (five) minutes x 3 doses as needed for chest pain. 20 tablet 0   ramipril (ALTACE) 10 MG capsule TAKE 1 CAPSULE BY MOUTH EVERY DAY 90 capsule 2   rosuvastatin (CRESTOR) 40 MG tablet Take 40 mg by mouth daily.     Current Facility-Administered Medications  Medication  Dose Route Frequency Provider Last Rate Last Admin   0.9 %  sodium chloride infusion  500 mL Intravenous Continuous Mansouraty, Telford Nab., MD        Current Outpatient Medications:    amLODipine (NORVASC) 5 MG tablet, Take 1 tablet (5 mg total) by mouth at bedtime., Disp: , Rfl:    aspirin EC 81 MG tablet, Take 81 mg by mouth daily., Disp: , Rfl:    ezetimibe (ZETIA) 10 MG tablet, TAKE ONE TABLET BY MOUTH DAILY *NEED OFFICE VISIT*, Disp: 30 tablet, Rfl: 0   Multiple Vitamins-Minerals (ONE-A-DAY MENS 50+ ADVANTAGE) TABS, Take 1 tablet by mouth daily., Disp: , Rfl:    Na Sulfate-K Sulfate-Mg Sulf (SUPREP BOWEL PREP KIT) 17.5-3.13-1.6 GM/177ML SOLN, Take 1 kit by mouth as directed. For colonoscopy prep, Disp: 354 mL, Rfl: 0   nitroGLYCERIN (NITROSTAT) 0.4 MG SL tablet, Place 1 tablet (0.4 mg total) under the tongue every 5 (five) minutes x 3 doses as needed for chest pain., Disp: 20 tablet, Rfl: 0   ramipril (ALTACE) 10 MG capsule, TAKE 1 CAPSULE BY MOUTH EVERY DAY, Disp: 90 capsule, Rfl: 2   rosuvastatin (CRESTOR) 40 MG tablet, Take 40 mg by mouth daily., Disp: , Rfl:   Current Facility-Administered Medications:    0.9 %  sodium chloride infusion, 500 mL, Intravenous, Continuous, Mansouraty, Telford Nab., MD No Known Allergies Family History  Problem Relation Age of Onset   Hypertension  Father    Social History   Socioeconomic History   Marital status: Married    Spouse name: Not on file   Number of children: Not on file   Years of education: Not on file   Highest education level: Not on file  Occupational History   Not on file  Tobacco Use   Smoking status: Never   Smokeless tobacco: Never  Substance and Sexual Activity   Alcohol use: Yes    Comment: Socially    Drug use: No   Sexual activity: Not on file  Other Topics Concern   Not on file  Social History Narrative   Not on file   Social Determinants of Health   Financial Resource Strain: Not on file  Food Insecurity:  Not on file  Transportation Needs: Not on file  Physical Activity: Not on file  Stress: Not on file  Social Connections: Not on file  Intimate Partner Violence: Not on file    Physical Exam: Today's Vitals   10/26/21 1258  BP: 123/60  Pulse: 60  Temp: (!) 96.9 F (36.1 C)  SpO2: 97%  Weight: 182 lb (82.6 kg)  Height: 6' (1.829 m)   Body mass index is 24.68 kg/m. GEN: NAD EYE: Sclerae anicteric ENT: MMM CV: Non-tachycardic GI: Soft, NT/ND NEURO:  Alert & Oriented x 3  Lab Results: No results for input(s): "WBC", "HGB", "HCT", "PLT" in the last 72 hours. BMET No results for input(s): "NA", "K", "CL", "CO2", "GLUCOSE", "BUN", "CREATININE", "CALCIUM" in the last 72 hours. LFT No results for input(s): "PROT", "ALBUMIN", "AST", "ALT", "ALKPHOS", "BILITOT", "BILIDIR", "IBILI" in the last 72 hours. PT/INR No results for input(s): "LABPROT", "INR" in the last 72 hours.   Impression / Plan: This is a 71 y.o.male who presents for colonoscopy for screening.  The risks and benefits of endoscopic evaluation/treatment were discussed with the patient and/or family; these include but are not limited to the risk of perforation, infection, bleeding, missed lesions, lack of diagnosis, severe illness requiring hospitalization, as well as anesthesia and sedation related illnesses.  The patient's history has been reviewed, patient examined, no change in status, and deemed stable for procedure.  The patient and/or family is agreeable to proceed.    Justice Britain, MD [facility] Advanced Endoscopy Office # [phone]

## 2021-10-26 NOTE — Addendum Note (Signed)
Addended by: Corliss Parish on: 10/26/2021 05:32 PM   Modules accepted: Level of Service

## 2021-10-30 ENCOUNTER — Telehealth: Payer: Self-pay

## 2021-10-30 NOTE — Telephone Encounter (Signed)
  Follow up Call-     10/26/2021    1:00 PM  Call back number  Post procedure Call Back phone  # [phone]  Permission to leave phone message Yes     Patient questions:  Do you have a fever, pain , or abdominal swelling? No. Pain Score  0 *  Have you tolerated food without any problems? Yes.    Have you been able to return to your normal activities? Yes.    Do you have any questions about your discharge instructions: Diet   No. Medications  No. Follow up visit  No.  Do you have questions or concerns about your Care? No.  Actions: * If pain score is 4 or above: No action needed, pain <4.

## 2021-12-19 ENCOUNTER — Ambulatory Visit: Payer: Medicare Other | Admitting: Gastroenterology

## 2021-12-19 DIAGNOSIS — Z0189 Encounter for other specified special examinations: Secondary | ICD-10-CM | POA: Diagnosis not present

## 2021-12-19 DIAGNOSIS — M25551 Pain in right hip: Secondary | ICD-10-CM | POA: Diagnosis not present

## 2021-12-28 DIAGNOSIS — M25551 Pain in right hip: Secondary | ICD-10-CM | POA: Diagnosis not present

## 2021-12-30 ENCOUNTER — Other Ambulatory Visit: Payer: Self-pay | Admitting: Cardiovascular Disease

## 2022-01-02 DIAGNOSIS — M25551 Pain in right hip: Secondary | ICD-10-CM | POA: Diagnosis not present

## 2022-01-11 DIAGNOSIS — M545 Low back pain, unspecified: Secondary | ICD-10-CM | POA: Diagnosis not present

## 2022-01-16 IMAGING — CR DG CHEST 2V
2 series · 2 of 2 positions shown · non-contrast
Comparison: None.

CLINICAL DATA: Chest pain

EXAM:
CHEST - 2 VIEW

[chest pa]
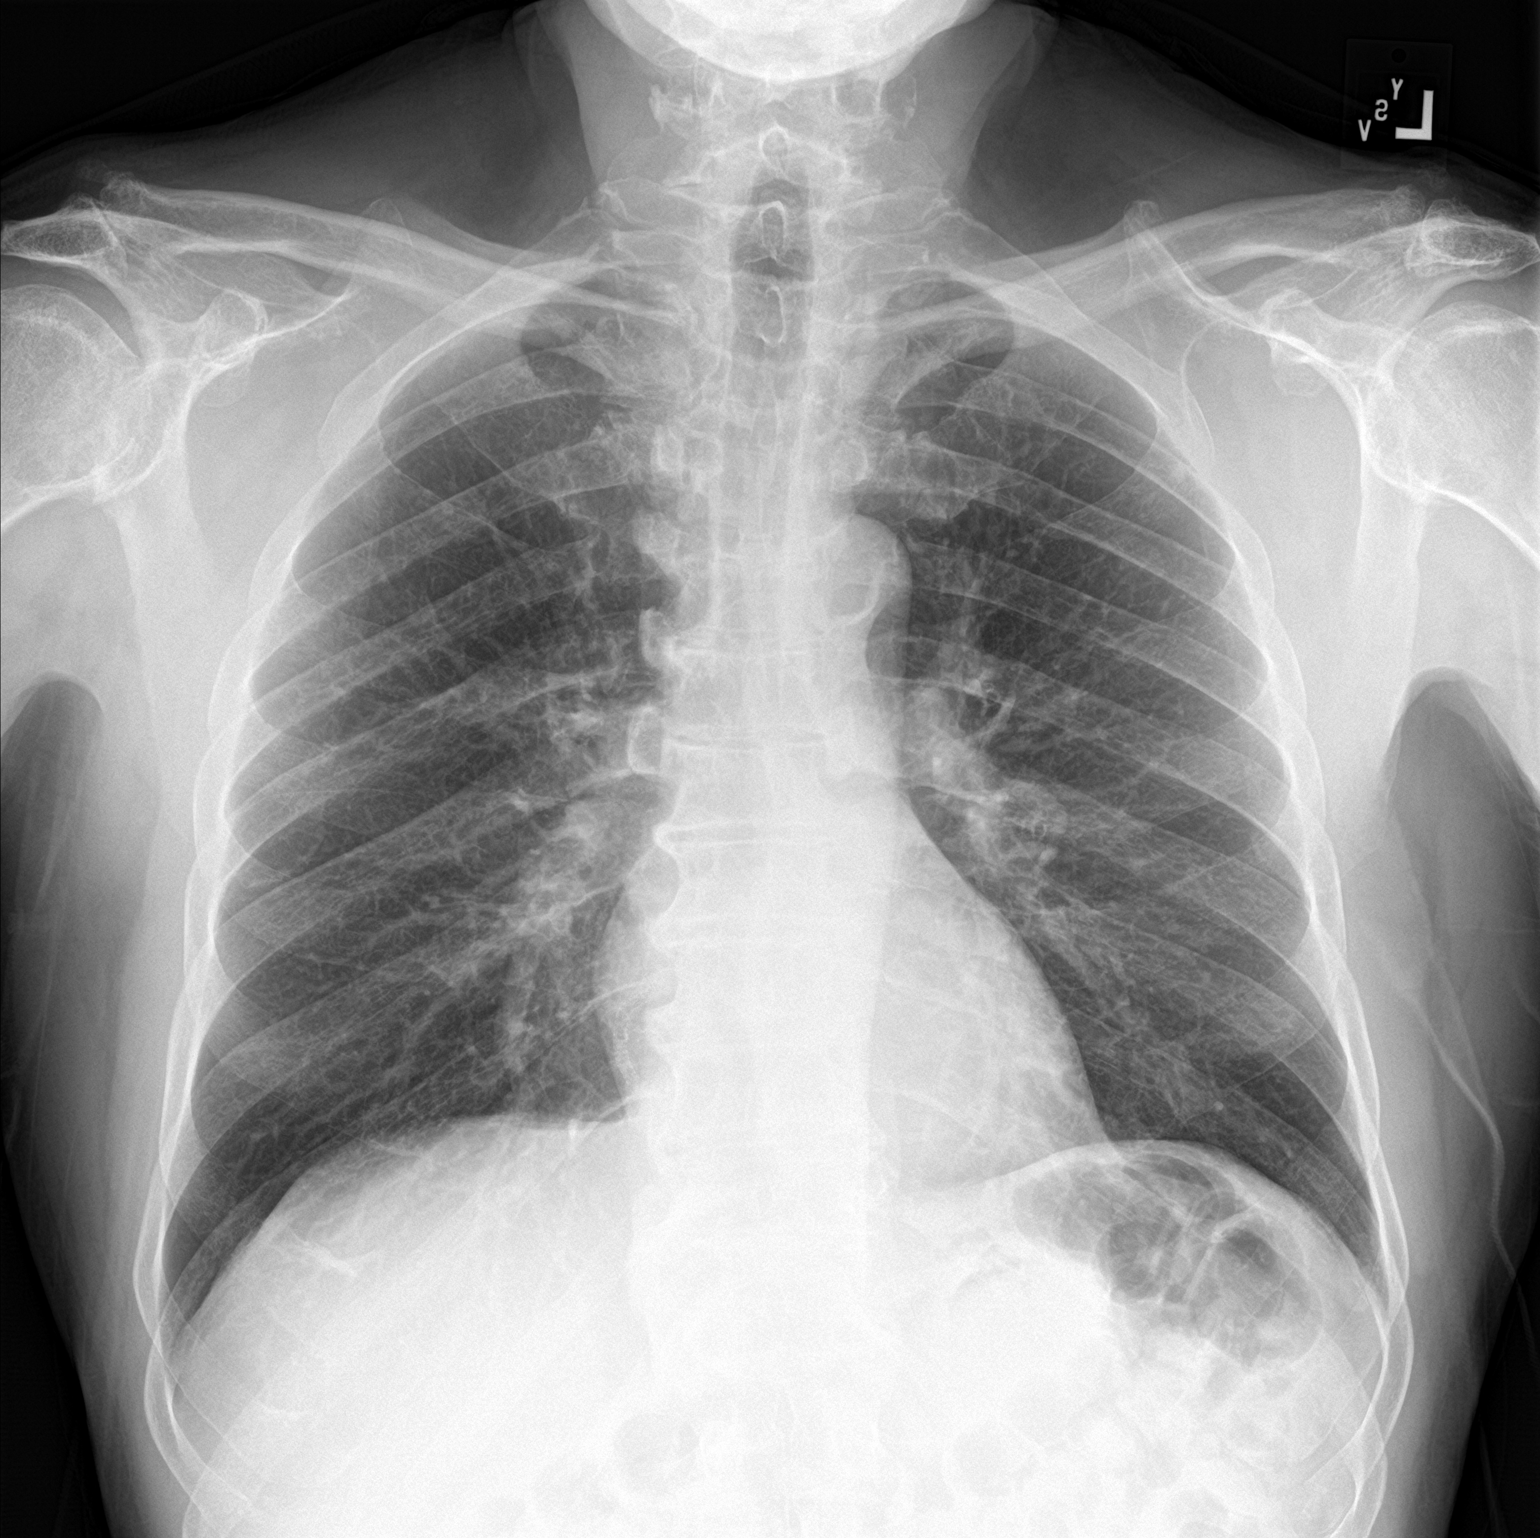

[chest lat]
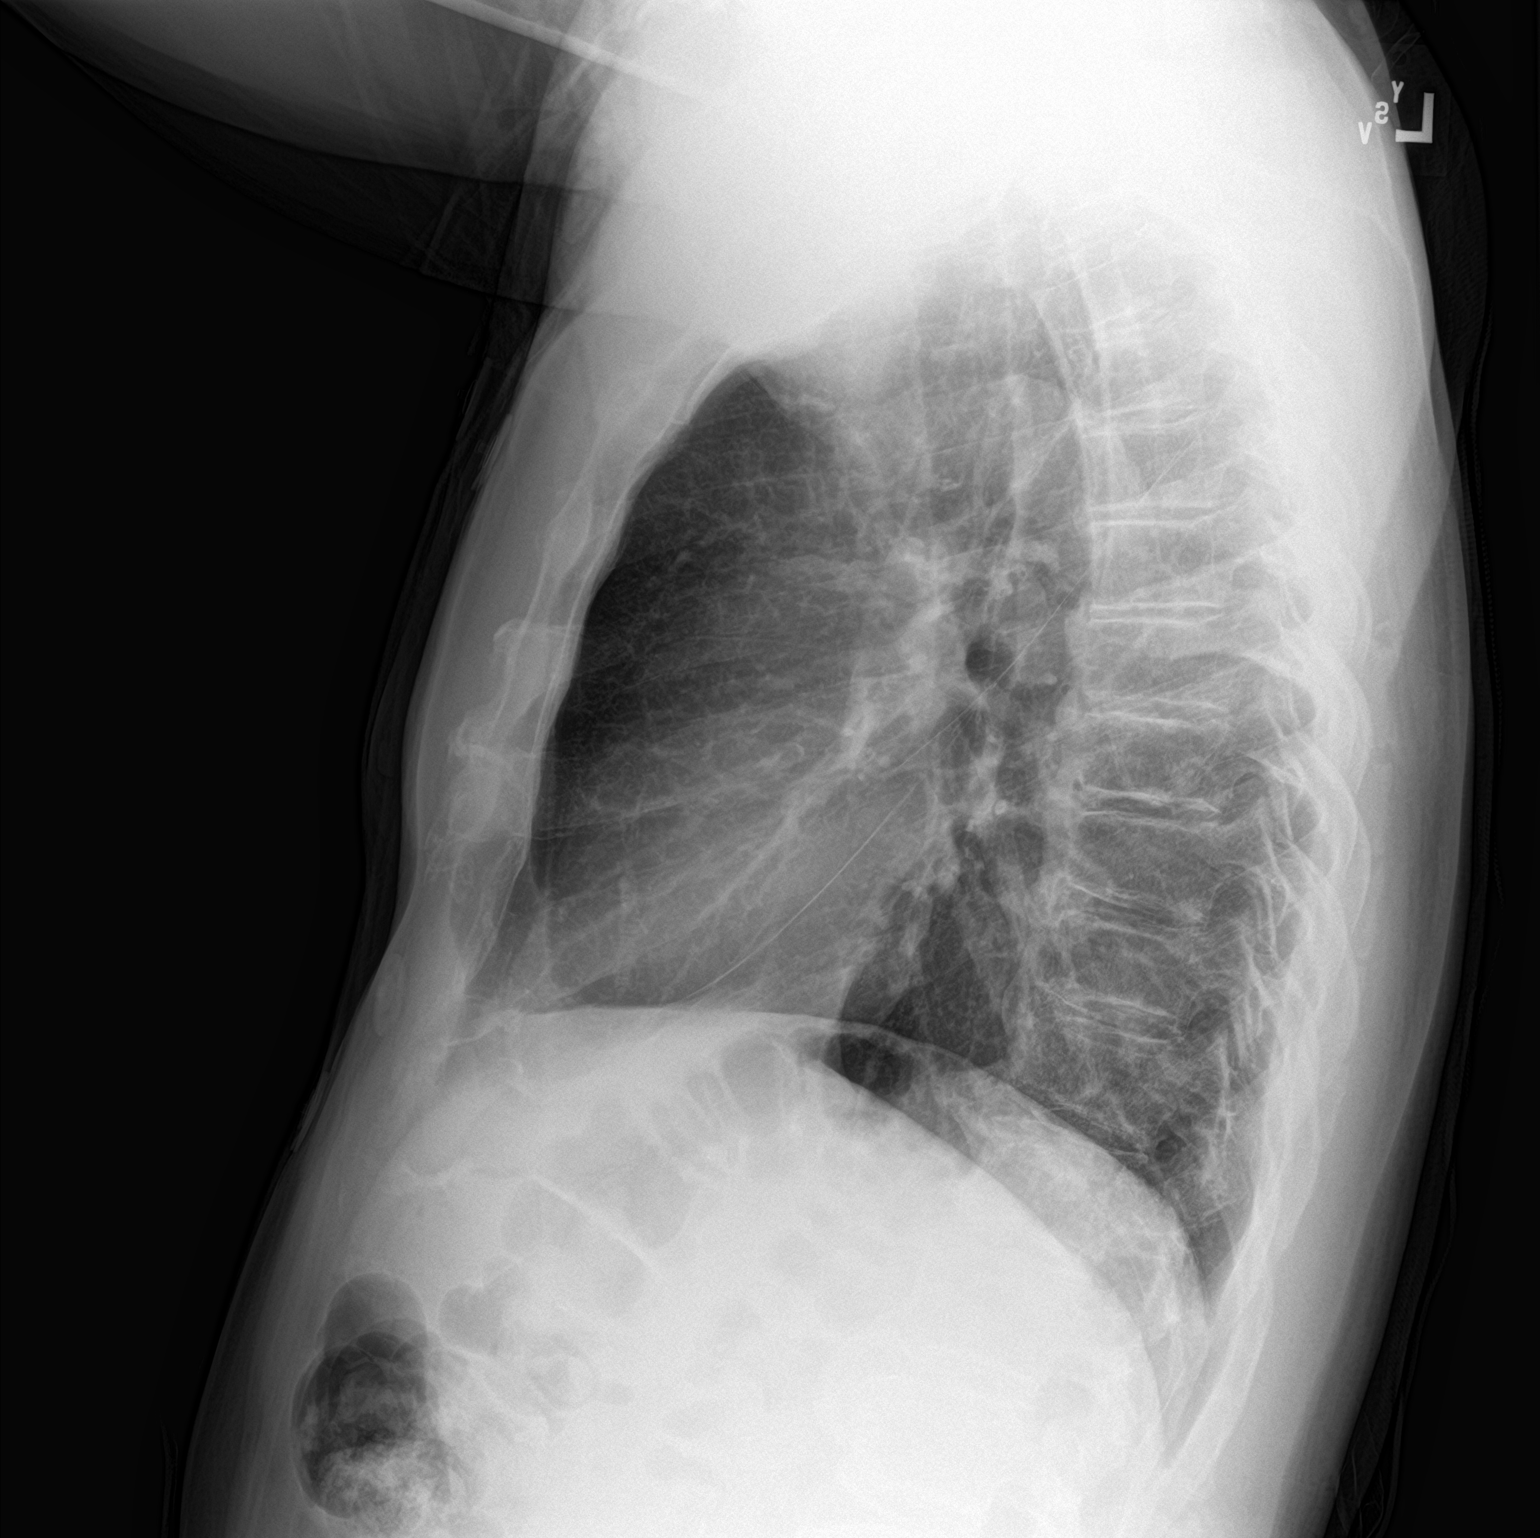

[2 of 2 positions shown; findings below may reference images not displayed]

FINDINGS: The heart size and mediastinal contours are within normal limits.
Aortic knob calcifications are seen. Both lungs are clear. The
visualized skeletal structures are unremarkable.
IMPRESSION: No active cardiopulmonary disease.

## 2022-01-22 DIAGNOSIS — M5416 Radiculopathy, lumbar region: Secondary | ICD-10-CM | POA: Diagnosis not present

## 2022-02-02 DIAGNOSIS — Z125 Encounter for screening for malignant neoplasm of prostate: Secondary | ICD-10-CM | POA: Diagnosis not present

## 2022-02-02 DIAGNOSIS — I10 Essential (primary) hypertension: Secondary | ICD-10-CM | POA: Diagnosis not present

## 2022-02-02 DIAGNOSIS — D649 Anemia, unspecified: Secondary | ICD-10-CM | POA: Diagnosis not present

## 2022-02-02 DIAGNOSIS — R7989 Other specified abnormal findings of blood chemistry: Secondary | ICD-10-CM | POA: Diagnosis not present

## 2022-02-02 DIAGNOSIS — E785 Hyperlipidemia, unspecified: Secondary | ICD-10-CM | POA: Diagnosis not present

## 2022-02-09 DIAGNOSIS — E785 Hyperlipidemia, unspecified: Secondary | ICD-10-CM | POA: Diagnosis not present

## 2022-02-09 DIAGNOSIS — R3589 Other polyuria: Secondary | ICD-10-CM | POA: Diagnosis not present

## 2022-02-09 DIAGNOSIS — N401 Enlarged prostate with lower urinary tract symptoms: Secondary | ICD-10-CM | POA: Diagnosis not present

## 2022-02-09 DIAGNOSIS — R7989 Other specified abnormal findings of blood chemistry: Secondary | ICD-10-CM | POA: Diagnosis not present

## 2022-02-09 DIAGNOSIS — I10 Essential (primary) hypertension: Secondary | ICD-10-CM | POA: Diagnosis not present

## 2022-02-09 DIAGNOSIS — Z0000 Encounter for general adult medical examination without abnormal findings: Secondary | ICD-10-CM | POA: Diagnosis not present

## 2022-02-09 DIAGNOSIS — M7071 Other bursitis of hip, right hip: Secondary | ICD-10-CM | POA: Diagnosis not present

## 2022-02-09 DIAGNOSIS — I2584 Coronary atherosclerosis due to calcified coronary lesion: Secondary | ICD-10-CM | POA: Diagnosis not present

## 2022-02-09 DIAGNOSIS — Z1339 Encounter for screening examination for other mental health and behavioral disorders: Secondary | ICD-10-CM | POA: Diagnosis not present

## 2022-02-09 DIAGNOSIS — Z1331 Encounter for screening for depression: Secondary | ICD-10-CM | POA: Diagnosis not present

## 2022-02-09 DIAGNOSIS — M545 Low back pain, unspecified: Secondary | ICD-10-CM | POA: Diagnosis not present

## 2022-02-19 DIAGNOSIS — M5416 Radiculopathy, lumbar region: Secondary | ICD-10-CM | POA: Diagnosis not present

## 2022-02-20 ENCOUNTER — Other Ambulatory Visit: Payer: Self-pay | Admitting: Cardiovascular Disease

## 2022-02-27 DIAGNOSIS — R051 Acute cough: Secondary | ICD-10-CM | POA: Diagnosis not present

## 2022-02-27 DIAGNOSIS — J069 Acute upper respiratory infection, unspecified: Secondary | ICD-10-CM | POA: Diagnosis not present

## 2022-02-27 DIAGNOSIS — R5383 Other fatigue: Secondary | ICD-10-CM | POA: Diagnosis not present

## 2022-02-27 DIAGNOSIS — R0981 Nasal congestion: Secondary | ICD-10-CM | POA: Diagnosis not present

## 2022-02-27 DIAGNOSIS — Z1152 Encounter for screening for COVID-19: Secondary | ICD-10-CM | POA: Diagnosis not present

## 2022-02-27 DIAGNOSIS — I10 Essential (primary) hypertension: Secondary | ICD-10-CM | POA: Diagnosis not present

## 2022-03-01 DIAGNOSIS — M5416 Radiculopathy, lumbar region: Secondary | ICD-10-CM | POA: Diagnosis not present

## 2022-03-07 ENCOUNTER — Other Ambulatory Visit: Payer: Self-pay | Admitting: Cardiovascular Disease

## 2022-03-23 DIAGNOSIS — M47816 Spondylosis without myelopathy or radiculopathy, lumbar region: Secondary | ICD-10-CM | POA: Diagnosis not present

## 2022-03-23 DIAGNOSIS — M5416 Radiculopathy, lumbar region: Secondary | ICD-10-CM | POA: Diagnosis not present

## 2022-03-30 ENCOUNTER — Other Ambulatory Visit: Payer: Self-pay | Admitting: Cardiovascular Disease

## 2022-04-30 ENCOUNTER — Emergency Department (HOSPITAL_COMMUNITY): Payer: Medicare Other

## 2022-04-30 ENCOUNTER — Other Ambulatory Visit: Payer: Self-pay

## 2022-04-30 ENCOUNTER — Emergency Department (HOSPITAL_COMMUNITY)
Admission: EM | Admit: 2022-04-30 | Discharge: 2022-05-01 | Disposition: A | Discharge status: Home / Self Care | Payer: Medicare Other | Attending: Emergency Medicine | Admitting: Emergency Medicine

## 2022-04-30 ENCOUNTER — Encounter (HOSPITAL_COMMUNITY): Payer: Self-pay

## 2022-04-30 DIAGNOSIS — R079 Chest pain, unspecified: Secondary | ICD-10-CM | POA: Diagnosis not present

## 2022-04-30 DIAGNOSIS — M79602 Pain in left arm: Secondary | ICD-10-CM | POA: Insufficient documentation

## 2022-04-30 DIAGNOSIS — R0602 Shortness of breath: Secondary | ICD-10-CM | POA: Diagnosis not present

## 2022-04-30 DIAGNOSIS — R002 Palpitations: Secondary | ICD-10-CM | POA: Diagnosis not present

## 2022-04-30 DIAGNOSIS — I10 Essential (primary) hypertension: Secondary | ICD-10-CM | POA: Diagnosis not present

## 2022-04-30 DIAGNOSIS — Z5321 Procedure and treatment not carried out due to patient leaving prior to being seen by health care provider: Secondary | ICD-10-CM | POA: Insufficient documentation

## 2022-04-30 LAB — BASIC METABOLIC PANEL
Anion gap: 10 (ref 5–15)
BUN: 11 mg/dL (ref 8–23)
CO2: 23 mmol/L (ref 22–32)
Calcium: 9.2 mg/dL (ref 8.9–10.3)
Chloride: 106 mmol/L (ref 98–111)
Creatinine, Ser: 1.2 mg/dL (ref 0.61–1.24)
GFR, Estimated: >60 mL/min (ref >60)
Glucose, Bld: 96 mg/dL (ref 70–99)
Potassium: 3.6 mmol/L (ref 3.5–5.1)
Sodium: 139 mmol/L (ref 135–145)

## 2022-04-30 LAB — CBC
HCT: 44.8 % (ref 39.0–52.0)
Hemoglobin: 14.5 g/dL (ref 13.0–17.0)
MCH: 30.1 pg (ref 26.0–34.0)
MCHC: 32.4 g/dL (ref 30.0–36.0)
MCV: 93.1 fL (ref 80.0–100.0)
Platelets: 196 10*3/uL (ref 150–400)
RBC: 4.81 MIL/uL (ref 4.22–5.81)
RDW: 13.2 % (ref 11.5–15.5)
WBC: 6.6 10*3/uL (ref 4.0–10.5)
nRBC: 0 % (ref 0.0–0.2)

## 2022-04-30 LAB — TROPONIN I (HIGH SENSITIVITY)
Troponin I (High Sensitivity): 5 ng/L (ref <18)
Troponin I (High Sensitivity): 6 ng/L (ref <18)

## 2022-04-30 NOTE — ED Triage Notes (Signed)
Pt arrived POV from home c/o left arm pain, SHOB and hypertension. Pt states he has felt like this in the past and it was a MI.

## 2022-04-30 NOTE — ED Notes (Signed)
eloped

## 2022-05-02 DIAGNOSIS — M7061 Trochanteric bursitis, right hip: Secondary | ICD-10-CM | POA: Diagnosis not present

## 2022-05-02 DIAGNOSIS — M47816 Spondylosis without myelopathy or radiculopathy, lumbar region: Secondary | ICD-10-CM | POA: Diagnosis not present

## 2022-05-03 ENCOUNTER — Encounter: Payer: Self-pay | Admitting: Cardiovascular Disease

## 2022-05-03 ENCOUNTER — Telehealth: Payer: Self-pay

## 2022-05-03 ENCOUNTER — Ambulatory Visit (INDEPENDENT_AMBULATORY_CARE_PROVIDER_SITE_OTHER): Payer: Medicare Other

## 2022-05-03 ENCOUNTER — Ambulatory Visit: Payer: Medicare Other | Attending: Cardiovascular Disease | Admitting: Cardiovascular Disease

## 2022-05-03 VITALS — BP 132/86 | HR 75 | Ht 70.0 in | Wt 186.0 lb

## 2022-05-03 DIAGNOSIS — E785 Hyperlipidemia, unspecified: Secondary | ICD-10-CM | POA: Insufficient documentation

## 2022-05-03 DIAGNOSIS — I2510 Atherosclerotic heart disease of native coronary artery without angina pectoris: Secondary | ICD-10-CM | POA: Insufficient documentation

## 2022-05-03 DIAGNOSIS — R002 Palpitations: Secondary | ICD-10-CM

## 2022-05-03 DIAGNOSIS — R072 Precordial pain: Secondary | ICD-10-CM | POA: Diagnosis not present

## 2022-05-03 DIAGNOSIS — I10 Essential (primary) hypertension: Secondary | ICD-10-CM | POA: Insufficient documentation

## 2022-05-03 LAB — LIPID PANEL
Chol/HDL Ratio: 3.3 ratio (ref 0.0–5.0)
Cholesterol, Total: 254 mg/dL — ABNORMAL HIGH (ref 100–199)
HDL: 77 mg/dL (ref >39)
LDL Chol Calc (NIH): 166 mg/dL — ABNORMAL HIGH (ref 0–99)
Triglycerides: 67 mg/dL (ref 0–149)
VLDL Cholesterol Cal: 11 mg/dL (ref 5–40)

## 2022-05-03 NOTE — Patient Instructions (Addendum)
Medication Instructions:    *If you need a refill on your cardiac medications before your next appointment, please call your pharmacy*   Lab Work:today Lipid  If you have labs (blood work) drawn today and your tests are completely normal, you will receive your results only by: MyChart Message (if you have MyChart) OR A paper copy in the mail If you have any lab test that is abnormal or we need to change your treatment, we will call you to review the results.   Testing/Procedures: Will be mailed to your home in 3 to 7 days Your physician has recommended that you wear a holter monitor 7 days Zio.  Holter monitors are medical devices that record the heart's electrical activity. Doctors most often use these monitors to diagnose arrhythmias. Arrhythmias are problems with the speed or rhythm of the heartbeat. The monitor is a small, portable device. You can wear one while you do your normal daily activities. This is usually used to diagnose what is causing  palpitations/syncope (passing out).  Will be schedule at [facility] [address] Your doctor has scheduled you for a Myocardial Perfusion scan  obtain information about the blood flow to your heart. The test consists of taking pictures of your heart in two phases: while resting and after a stress test.  The stress test may involve walking on a treadmill, or if you are unable to exercise adequately, you will be given a drug intended to have a similar effect on the heart to that of exercise.  The test will take approximately 3 to 4  hours to complete. How to prepare for your test: Do not eat or drink 2 hours prior to your test Do not consume products containing caffeine 12 hours prior to your test (examples: coffee (regular OR decaf), chocolate, sodas, tea) Your doctor may need you to hold certain medications prior to the test.  If so, these are listed below and should not be taken for 24 hours prior to the test.  If not listed below,  you may take your medications as normal.  You may resume taking held medications on your normal schedule once the test is complete.   Meds to hold: none Do bring a list of your current medications with you.  If you have held any meds in preparation for the test, please bring them, as you may be required to take them once the test is completed. Do wear comfortable clothes and walking shoes.  Do not wear dresses or overalls. Do NOT wear cologne, perfume, aftershave, or fragranced lotions the day of your test (deodorants okay). If these instructions are not followed your test will have to be rescheduled.   A nuclear cardiologist will review your test, prepare a report and send it to your physician.   If you have questions or concerns about your appointment, you can call the Nuclear Cardiology department at [phone] x [phone]. If you cannot keep your appointment, please provide 48 hours notification to avoid a possible $50.00 charge to your account.   Please arrive 15 minutes prior to your appointment time for registration and insurance purposes   Follow-Up: At [facility], you and your health needs are our priority.  As part of our continuing mission to provide you with exceptional heart care, we have created designated Provider Care Teams.  These Care Teams include your primary Cardiologist (physician) and Advanced Practice Providers (APPs -  Physician Assistants and Nurse Practitioners) who all work together to provide you  with the care you need, when you need it.     Your next appointment:   4 month(s)  The format for your next appointment:   In Person  Provider:   Thurmon Fair, MD    Other Instructions  KardiaMobile [email or web address]        FDA-cleared, clinical grade mobile EKG monitor: Lourena Simmonds is the most clinically-validated mobile EKG used by the world's leading cardiac care medical professionals With Basic service, know instantly if your  heart rhythm is normal or if atrial fibrillation is detected, and email the last single EKG recording to yourself or your doctor Premium service, available for purchase through the Kardia app for $9.99 per month or $99 per year, includes unlimited history and storage of your EKG recordings, a monthly EKG summary report to share with your doctor, along with the ability to track your blood pressure, activity and weight Includes one KardiaMobile phone clip FREE SHIPPING: Standard delivery 1-3 business days. Orders placed by 11:00am PST will ship that afternoon. Otherwise, will ship next business day. All orders ship via [employer] from [place], [place]    [employer] - sending an EKG Download app and set up profile. Run EKG - by placing 1-2 fingers on the silver plates After EKG is complete - Download PDF  - Skip password (if you apply a password the provider will need it to view the EKG) Click share button (square with upward arrow) in bottom left corner To send: choose MyChart (first time log into MyChart)  Pop up window about sending ECG Click continue Choose type of message Choose provider Type subject and message Click send (EKG should be attached)  - To send additional EKGs in one message click the paperclip image and bottom of page to attach.       ZIO XT- Long Term Monitor Instructions  Your physician has requested you wear a ZIO patch monitor for 7 days.  This is a single patch monitor. [employer] supplies one patch monitor per enrollment. Additional stickers are not available. Please do not apply patch if you will be having a Nuclear Stress Test,  Echocardiogram, Cardiac CT, MRI, or Chest Xray during the period you would be wearing the  monitor. The patch cannot be worn during these tests. You cannot remove and re-apply the  ZIO XT patch monitor.  Your ZIO patch monitor will be mailed 3 day USPS to your address on file. It may take 3-5 days  to receive your monitor after you  have been enrolled.  Once you have received your monitor, please review the enclosed instructions. Your monitor  has already been registered assigning a specific monitor serial # to you.  Billing and Patient Assistance Program Information  We have supplied [employer] with any of your insurance information on file for billing purposes. [employer] offers a sliding scale Patient Assistance Program for patients that do not have  insurance, or whose insurance does not completely cover the cost of the ZIO monitor.  You must apply for the Patient Assistance Program to qualify for this discounted rate.  To apply, please call [employer] at [phone], select option 4, select option 2, ask to apply for  Patient Assistance Program. Meredeth Ide will ask your household income, and how many people  are in your household. They will quote your out-of-pocket cost based on that information.  [employer] will also be able to set up a 65-month, interest-free payment plan if needed.  Applying the monitor   Shave hair from upper  left chest.  Hold abrader disc by orange tab. Rub abrader in 40 strokes over the upper left chest as  indicated in your monitor instructions.  Clean area with 4 enclosed alcohol pads. Let dry.  Apply patch as indicated in monitor instructions. Patch will be placed under collarbone on left  side of chest with arrow pointing upward.  Rub patch adhesive wings for 2 minutes. Remove white label marked "1". Remove the white  label marked "2". Rub patch adhesive wings for 2 additional minutes.  While looking in a mirror, press and release button in center of patch. A small green light will  flash 3-4 times. This will be your only indicator that the monitor has been turned on.  Do not shower for the first 24 hours. You may shower after the first 24 hours.  Press the button if you feel a symptom. You will hear a small click. Record Date, Time and  Symptom in the Patient Logbook.  When you are ready to  remove the patch, follow instructions on the last 2 pages of Patient  Logbook. Stick patch monitor onto the last page of Patient Logbook.  Place Patient Logbook in the blue and white box. Use locking tab on box and tape box closed  securely. The blue and white box has prepaid postage on it. Please place it in the mailbox as  soon as possible. Your physician should have your test results approximately 7 days after the  monitor has been mailed back to [facility].  Call [facility] Customer Care at [phone] if you have questions regarding  your ZIO XT patch monitor. Call them immediately if you see an orange light blinking on your  monitor.  If your monitor falls off in less than 4 days, contact our Monitor department at [phone].  If your monitor becomes loose or falls off after 4 days call [employer] at [phone] for  suggestions on securing your monitor

## 2022-05-03 NOTE — Progress Notes (Signed)
Cardiology Office Note     Date:  05/10/2022   ID:  Kevin Wade, DOB 1951/01/06, MRN [id number]   PCP:  Alysia Penna, MD  Cardiologist:  Thurmon Fair, MD  Electrophysiologist:  None   Evaluation Performed:  Follow-Up Visit  Chief Complaint:  CAD  History of Present Illness:    Kevin Wade is a 71 y.o. male with a history of hypertension hyperlipidemia presenting with a very small non-STEMI on November 13, 2017 followed by placement of a drug-eluting stent for a high-grade stenosis in the mid right coronary artery.  Also had a moderate 40% stenosis in the left circumflex coronary artery but has preserved left ventricular systolic function.   On Monday, December 18 he was seen in the emergency room for sudden onset of dyspnea at rest.  This woke him up in the middle of the night at about 4 AM.  It happened again around 6 AM.  He was associated with palpitations that he describes as a racing heartbeat.  It often start with "3 sharp sticks" in his precordial area.  He went to the emergency room and labs were drawn and EKG were performed, with normal results.  He left after waiting for 15 hours without being seen.  He still has occasional sensation of racing heartbeat and brief dyspnea, including during our office visit today.  On exam he is having isolated ectopic beats, but we did not catch any on his ECG.  He reports having some type of an arrhythmia when he was in [place] and was given a "medicine that slowed him down".  ECG today shows normal sinus rhythm with nonspecific repolarization changes with J-point elevation in V3-6, no ischemic abnormalities, QTc 426 ms.  He is a very active lifestyle.  Eats a healthy diet.  Does not have any exertional chest pain or shortness of breath.  Does not have syncope or dizziness.  Has not had any edema, orthopnea or PND.  Denies focal neurological complaints or claudication.   Past Medical History:  Diagnosis Date   Arthritis     Hyperlipidemia    Hypertension    Past Surgical History:  Procedure Laterality Date   APPENDECTOMY     CORONARY STENT INTERVENTION N/A 11/13/2017   Procedure: CORONARY STENT INTERVENTION;  Surgeon: Tonny Bollman, MD;  Location: [facility] INVASIVE CV LAB;  Service: Cardiovascular;  Laterality: N/A;   LEFT HEART CATH AND CORONARY ANGIOGRAPHY N/A 11/13/2017   Procedure: LEFT HEART CATH AND CORONARY ANGIOGRAPHY;  Surgeon: Tonny Bollman, MD;  Location: [facility] INVASIVE CV LAB;  Service: Cardiovascular;  Laterality: N/A;   LEFT HEART CATH AND CORONARY ANGIOGRAPHY N/A 04/21/2020   Procedure: LEFT HEART CATH AND CORONARY ANGIOGRAPHY;  Surgeon: Lennette Bihari, MD;  Location: [facility] INVASIVE CV LAB;  Service: Cardiovascular;  Laterality: N/A;   ROTATOR CUFF REPAIR Right    Dr. Ave Filter     Current Meds  Medication Sig   amLODipine (NORVASC) 5 MG tablet Take 1 tablet (5 mg total) by mouth at bedtime.   Ascorbic Acid (VITAMIN C ADULT GUMMIES PO) Take 1 capsule by mouth daily in the afternoon.   aspirin EC 81 MG tablet Take 81 mg by mouth daily.   cholecalciferol (VITAMIN D3) 10 MCG (400 UNIT) TABS tablet Take 400 Units by mouth daily.   ezetimibe (ZETIA) 10 MG tablet TAKE 1 TABLET BY MOUTH DAILY **SCHEDULE ANNUAL APPOINTMENT FOR FUTURE REFILLS**   meloxicam (MOBIC) 15 MG tablet Take 15 mg by mouth daily.   Multiple  Vitamins-Minerals (ONE-A-DAY MENS 50+ ADVANTAGE) TABS Take 1 tablet by mouth daily.   Multiple Vitamins-Minerals (ZINC PO) Take 1 capsule by mouth daily in the afternoon.   ramipril (ALTACE) 10 MG capsule TAKE 1 CAPSULE BY MOUTH EVERY DAY   rosuvastatin (CRESTOR) 40 MG tablet Take 40 mg by mouth daily.     Allergies:   Patient has no known allergies.   Social History   Tobacco Use   Smoking status: Never   Smokeless tobacco: Never  Substance Use Topics   Alcohol use: Yes    Comment: Socially    Drug use: No     Family Hx: The patient's family history includes Hypertension in his  father. There is no history of Colon cancer, Rectal cancer, Stomach cancer, Esophageal cancer, Inflammatory bowel disease, Liver disease, or Pancreatic cancer.  ROS:   Please see the history of present illness.   All other systems are reviewed and are negative.  Prior CV studies:   The following studies were reviewed today:  Labs from 12/04/2019 from PCP, unfortunately no lipid profile was performed  Labs/Other Tests and Data Reviewed:    EKG: Ordered today shows sinus bradycardia and early repolarization, no signs of previous infarction, no acute ischemic changes.  QT 460 ms  Recent Labs: 04/30/2022: BUN 11; Creatinine, Ser 1.20; Hemoglobin 14.5; Platelets 196; Potassium 3.6; Sodium 139   Recent Lipid Panel Lab Results  Component Value Date/Time   CHOL 254 (H) 05/03/2022 10:31 AM   TRIG 67 05/03/2022 10:31 AM   HDL 77 05/03/2022 10:31 AM   CHOLHDL 3.3 05/03/2022 10:31 AM   CHOLHDL 3.0 11/14/2017 03:22 AM   LDLCALC 166 (H) 05/03/2022 10:31 AM    Wt Readings from Last 3 Encounters:  05/08/22 186 lb (84.4 kg)  05/03/22 186 lb (84.4 kg)  04/30/22 186 lb (84.4 kg)     Objective:    Vital Signs:  BP 132/86 (BP Location: Left Arm, Patient Position: Sitting, Cuff Size: Normal)   Pulse 75   Ht  (1.778 m)   Wt 186 lb (84.4 kg)   SpO2 96%   BMI 26.69 kg/m     General: Alert, oriented x3, no distress, appears fit and younger than stated age Head: no evidence of trauma, PERRL, EOMI, no exophtalmos or lid lag, no myxedema, no xanthelasma; normal ears, nose and oropharynx Neck: normal jugular venous pulsations and no hepatojugular reflux; brisk carotid pulses without delay and no carotid bruits Chest: clear to auscultation, no signs of consolidation by percussion or palpation, normal fremitus, symmetrical and full respiratory excursions Cardiovascular: normal position and quality of the apical impulse, regular rhythm, normal first and second heart sounds, no murmurs, rubs or  gallops Abdomen: no tenderness or distention, no masses by palpation, no abnormal pulsatility or arterial bruits, normal bowel sounds, no hepatosplenomegaly Extremities: no clubbing, cyanosis or edema; 2+ radial, ulnar and brachial pulses bilaterally; 2+ right femoral, posterior tibial and dorsalis pedis pulses; 2+ left femoral, posterior tibial and dorsalis pedis pulses; no subclavian or femoral bruits Neurological: grossly nonfocal Psych: Normal mood and affect    ASSESSMENT & PLAN:    1. Coronary artery disease involving native coronary artery of native heart without angina pectoris   2. Palpitations   3. Precordial pain   4. Essential hypertension   5. Hyperlipidemia LDL goal <70      CAD: He does not have exertional angina pectoris, but has had some atypical precordial pain.  Will schedule him for a plain treadmill  stress test.  On aspirin and statin, not on beta-blockers due to bradycardia in the past, but currently with heart rate in the 70s. Palpitations: I think what he is describing as brief shortness of breath is actually the sensation of a post ectopic compensatory problems.  Will have him wear a monitor and he should log his symptoms. HTN: Well-controlled. HLP: Labs were checked today and showed markedly elevated LDL cholesterol at 166.  I am not sure why, but he stopped taking rosuvastatin and Zetia about 3 months ago.  It was not due to side effects.  He will restart them.  Patient Instructions  Medication Instructions:    *If you need a refill on your cardiac medications before your next appointment, please call your pharmacy*   Lab Work:today Lipid  If you have labs (blood work) drawn today and your tests are completely normal, you will receive your results only by: MyChart Message (if you have MyChart) OR A paper copy in the mail If you have any lab test that is abnormal or we need to change your treatment, we will call you to review the  results.   Testing/Procedures: Will be mailed to your home in 3 to 7 days Your physician has recommended that you wear a holter monitor 7 days Zio.  Holter monitors are medical devices that record the heart's electrical activity. Doctors most often use these monitors to diagnose arrhythmias. Arrhythmias are problems with the speed or rhythm of the heartbeat. The monitor is a small, portable device. You can wear one while you do your normal daily activities. This is usually used to diagnose what is causing  palpitations/syncope (passing out).  Will be schedule at [address] Your doctor has scheduled you for a Myocardial Perfusion scan  obtain information about the blood flow to your heart. The test consists of taking pictures of your heart in two phases: while resting and after a stress test.  The stress test may involve walking on a treadmill, or if you are unable to exercise adequately, you will be given a drug intended to have a similar effect on the heart to that of exercise.  The test will take approximately 3 to 4  hours to complete. How to prepare for your test: Do not eat or drink 2 hours prior to your test Do not consume products containing caffeine 12 hours prior to your test (examples: coffee (regular OR decaf), chocolate, sodas, tea) Your doctor may need you to hold certain medications prior to the test.  If so, these are listed below and should not be taken for 24 hours prior to the test.  If not listed below, you may take your medications as normal.  You may resume taking held medications on your normal schedule once the test is complete.   Meds to hold: none Do bring a list of your current medications with you.  If you have held any meds in preparation for the test, please bring them, as you may be required to take them once the test is completed. Do wear comfortable clothes and walking shoes.  Do not wear dresses or overalls. Do NOT wear cologne, perfume,  aftershave, or fragranced lotions the day of your test (deodorants okay). If these instructions are not followed your test will have to be rescheduled.   A nuclear cardiologist will review your test, prepare a report and send it to your physician.   If you have questions or concerns about your appointment, you  can call the Nuclear Cardiology department at [phone] x [phone]. If you cannot keep your appointment, please provide 48 hours notification to avoid a possible $50.00 charge to your account.   Please arrive 15 minutes prior to your appointment time for registration and insurance purposes   Follow-Up: At [facility], you and your health needs are our priority.  As part of our continuing mission to provide you with exceptional heart care, we have created designated Provider Care Teams.  These Care Teams include your primary Cardiologist (physician) and Advanced Practice Providers (APPs -  Physician Assistants and Nurse Practitioners) who all work together to provide you with the care you need, when you need it.     Your next appointment:   4 month(s)  The format for your next appointment:   In Person  Provider:   Thurmon Fair, MD    Other Instructions  [email or web address] [email or web address]        FDA-cleared, clinical grade mobile EKG monitor: Lourena Simmonds is the most clinically-validated mobile EKG used by the world's leading cardiac care medical professionals With Basic service, know instantly if your heart rhythm is normal or if atrial fibrillation is detected, and email the last single EKG recording to yourself or your doctor Premium service, available for purchase through the Kardia app for $9.99 per month or $99 per year, includes unlimited history and storage of your EKG recordings, a monthly EKG summary report to share with your doctor, along with the ability to track your blood pressure, activity and weight Includes one KardiaMobile phone clip FREE  SHIPPING: Standard delivery 1-3 business days. Orders placed by 11:00am PST will ship that afternoon. Otherwise, will ship next business day. All orders ship via [employer] from [place], [place]    [employer] - sending an EKG Download app and set up profile. Run EKG - by placing 1-2 fingers on the silver plates After EKG is complete - Download PDF  - Skip password (if you apply a password the provider will need it to view the EKG) Click share button (square with upward arrow) in bottom left corner To send: choose MyChart (first time log into MyChart)  Pop up window about sending ECG Click continue Choose type of message Choose provider Type subject and message Click send (EKG should be attached)  - To send additional EKGs in one message click the paperclip image and bottom of page to attach.       ZIO XT- Long Term Monitor Instructions  Your physician has requested you wear a ZIO patch monitor for 7 days.  This is a single patch monitor. [employer] supplies one patch monitor per enrollment. Additional stickers are not available. Please do not apply patch if you will be having a Nuclear Stress Test,  Echocardiogram, Cardiac CT, MRI, or Chest Xray during the period you would be wearing the  monitor. The patch cannot be worn during these tests. You cannot remove and re-apply the  ZIO XT patch monitor.  Your ZIO patch monitor will be mailed 3 day USPS to your address on file. It may take 3-5 days  to receive your monitor after you have been enrolled.  Once you have received your monitor, please review the enclosed instructions. Your monitor  has already been registered assigning a specific monitor serial # to you.  Billing and Patient Assistance Program Information  We have supplied [employer] with any of your insurance information on file for billing purposes. [employer] offers a sliding scale Patient Assistance Program for  patients that do not have  insurance, or whose insurance does  not completely cover the cost of the ZIO monitor.  You must apply for the Patient Assistance Program to qualify for this discounted rate.  To apply, please call [employer] at [phone], select option 4, select option 2, ask to apply for  Patient Assistance Program. Meredeth [employer] will ask your household income, and how many people  are in your household. They will quote your out-of-pocket cost based on that information.  [employer] will also be able to set up a 3470-month, interest-free payment plan if needed.  Applying the monitor   Shave hair from upper left chest.  Hold abrader disc by orange tab. Rub abrader in 40 strokes over the upper left chest as  indicated in your monitor instructions.  Clean area with 4 enclosed alcohol pads. Let dry.  Apply patch as indicated in monitor instructions. Patch will be placed under collarbone on left  side of chest with arrow pointing upward.  Rub patch adhesive wings for 2 minutes. Remove white label marked "1". Remove the white  label marked "2". Rub patch adhesive wings for 2 additional minutes.  While looking in a mirror, press and release button in center of patch. A small green light will  flash 3-4 times. This will be your only indicator that the monitor has been turned on.  Do not shower for the first 24 hours. You may shower after the first 24 hours.  Press the button if you feel a symptom. You will hear a small click. Record Date, Time and  Symptom in the Patient Logbook.  When you are ready to remove the patch, follow instructions on the last 2 pages of Patient  Logbook. Stick patch monitor onto the last page of Patient Logbook.  Place Patient Logbook in the blue and white box. Use locking tab on box and tape box closed  securely. The blue and white box has prepaid postage on it. Please place it in the mailbox as  soon as possible. Your physician should have your test results approximately 7 days after the  monitor has been [facility].   Call [facility] Customer Care at [phone] if you have questions regarding  your ZIO XT patch monitor. Call them immediately if you see an orange light blinking on your  monitor.  If your monitor falls off in less than 4 days, contact our Monitor department at [phone].  If your monitor becomes loose or falls off after 4 days call [employer] at [phone] for  suggestions on securing your monitor   Signed, Thurmon FairMihai Marcina Kinnison, MD  05/10/2022 10:51 AM    [facility]

## 2022-05-03 NOTE — Telephone Encounter (Signed)
Spoke with the patient, detailed instructions given. He stated that he would be here for his test. Asked to call back with any questions. S.Ghislaine Harcum EMTP 

## 2022-05-03 NOTE — Progress Notes (Unsigned)
Enrolled for [employer] to mail a ZIO XT long term holter monitor to the patients address on file.  

## 2022-05-04 ENCOUNTER — Other Ambulatory Visit: Payer: Self-pay

## 2022-05-04 DIAGNOSIS — I2510 Atherosclerotic heart disease of native coronary artery without angina pectoris: Secondary | ICD-10-CM

## 2022-05-04 DIAGNOSIS — E7800 Pure hypercholesterolemia, unspecified: Secondary | ICD-10-CM

## 2022-05-08 ENCOUNTER — Ambulatory Visit (HOSPITAL_COMMUNITY): Payer: Medicare Other | Attending: Cardiovascular Disease

## 2022-05-08 DIAGNOSIS — R072 Precordial pain: Secondary | ICD-10-CM | POA: Diagnosis not present

## 2022-05-08 LAB — MYOCARDIAL PERFUSION IMAGING
Estimated workload: 8.6
Exercise duration (min): 7 min
Exercise duration (sec): 46 s
LV dias vol: 82 mL (ref 62–150)
LV sys vol: 32 mL
MPHR: 148 {beats}/min
Nuc Stress EF: 61 %
Peak HR: 139 {beats}/min
Percent HR: 93 %
Rest HR: 48 {beats}/min
Rest Nuclear Isotope Dose: 10.8 mCi
SDS: 0
SRS: 0
SSS: 0
ST Depression (mm): 0 mm
Stress Nuclear Isotope Dose: 32.9 mCi
TID: 0.98

## 2022-05-08 MED ORDER — TECHNETIUM TC 99M TETROFOSMIN IV KIT
32.9000 | PACK | Freq: Once | INTRAVENOUS | Status: AC | PRN
  Administered 2022-05-08: 32.9 via INTRAVENOUS

## 2022-05-08 MED ORDER — TECHNETIUM TC 99M TETROFOSMIN IV KIT
10.8000 | PACK | Freq: Once | INTRAVENOUS | Status: AC | PRN
  Administered 2022-05-08: 10.8 via INTRAVENOUS

## 2022-05-09 ENCOUNTER — Telehealth: Payer: Self-pay

## 2022-05-09 NOTE — Telephone Encounter (Signed)
     Patient  visit on 12/19  at [facility]   Have you been able to follow up with your primary care physician? Yes   The patient was or was not able to obtain any needed medicine or equipment. Yes   Are there diet recommendations that you are having difficulty following?NA  Patient expresses understanding of discharge instructions and education provided has no other needs at this time.  Yes      Lenard Forth [facility] Guide, [facility], Care Management  [phone] [address] [place], [place], [place] [address] Phone: [phone] Email: [email or web address]

## 2022-05-10 ENCOUNTER — Encounter: Payer: Self-pay | Admitting: Cardiovascular Disease

## 2022-05-10 DIAGNOSIS — R002 Palpitations: Secondary | ICD-10-CM | POA: Diagnosis not present

## 2022-05-19 ENCOUNTER — Other Ambulatory Visit: Payer: Self-pay | Admitting: Cardiovascular Disease

## 2022-05-25 DIAGNOSIS — R002 Palpitations: Secondary | ICD-10-CM | POA: Diagnosis not present

## 2022-05-28 DIAGNOSIS — M533 Sacrococcygeal disorders, not elsewhere classified: Secondary | ICD-10-CM | POA: Diagnosis not present

## 2022-06-05 DIAGNOSIS — M533 Sacrococcygeal disorders, not elsewhere classified: Secondary | ICD-10-CM | POA: Diagnosis not present

## 2022-06-06 ENCOUNTER — Ambulatory Visit: Payer: Medicare Other | Admitting: General Practice

## 2022-06-11 DIAGNOSIS — J3489 Other specified disorders of nose and nasal sinuses: Secondary | ICD-10-CM | POA: Diagnosis not present

## 2022-06-11 DIAGNOSIS — R058 Other specified cough: Secondary | ICD-10-CM | POA: Diagnosis not present

## 2022-07-02 DIAGNOSIS — H35033 Hypertensive retinopathy, bilateral: Secondary | ICD-10-CM | POA: Diagnosis not present

## 2022-07-02 DIAGNOSIS — M5416 Radiculopathy, lumbar region: Secondary | ICD-10-CM | POA: Diagnosis not present

## 2022-07-02 DIAGNOSIS — H2513 Age-related nuclear cataract, bilateral: Secondary | ICD-10-CM | POA: Diagnosis not present

## 2022-07-10 DIAGNOSIS — M5416 Radiculopathy, lumbar region: Secondary | ICD-10-CM | POA: Diagnosis not present

## 2022-08-13 ENCOUNTER — Ambulatory Visit: Payer: Medicare PPO | Attending: Cardiovascular Disease | Admitting: Cardiovascular Disease

## 2022-08-13 ENCOUNTER — Encounter: Payer: Self-pay | Admitting: Cardiovascular Disease

## 2022-08-13 VITALS — BP 118/62 | HR 54 | Ht 71.0 in | Wt 182.4 lb

## 2022-08-13 DIAGNOSIS — E7800 Pure hypercholesterolemia, unspecified: Secondary | ICD-10-CM

## 2022-08-13 DIAGNOSIS — I10 Essential (primary) hypertension: Secondary | ICD-10-CM | POA: Diagnosis not present

## 2022-08-13 DIAGNOSIS — I2510 Atherosclerotic heart disease of native coronary artery without angina pectoris: Secondary | ICD-10-CM | POA: Diagnosis not present

## 2022-08-13 NOTE — Patient Instructions (Signed)
Medication Instructions:  No changes *If you need a refill on your cardiac medications before your next appointment, please call your pharmacy*   Lab Work: Lipid Panel today If you have labs (blood work) drawn today and your tests are completely normal, you will receive your results only by: [facility] (if you have MyChart) OR A paper copy in the mail If you have any lab test that is abnormal or we need to change your treatment, we will call you to review the results.  Follow-Up: At [facility], you and your health needs are our priority.  As part of our continuing mission to provide you with exceptional heart care, we have created designated Provider Care Teams.  These Care Teams include your primary Cardiologist (physician) and Advanced Practice Providers (APPs -  Physician Assistants and Nurse Practitioners) who all work together to provide you with the care you need, when you need it.  We recommend signing up for the patient portal called "MyChart".  Sign up information is provided on this After Visit Summary.  MyChart is used to connect with patients for Virtual Visits (Telemedicine).  Patients are able to view lab/test results, encounter notes, upcoming appointments, etc.  Non-urgent messages can be sent to your provider as well.   To learn more about what you can do with MyChart, go to [email or web address].    Your next appointment:   1 year(s)  Provider:   Sanda Klein, MD

## 2022-08-13 NOTE — Progress Notes (Signed)
Cardiology Office Note     Date:  08/13/2022   ID:  Kevin Wade, DOB 01/14/1951, MRN [id number]   PCP:  Velna Hatchet, MD  Cardiologist:  Sanda Klein, MD  Electrophysiologist:  None   Evaluation Performed:  Follow-Up Visit  Chief Complaint:  CAD  History of Present Illness:    Kevin Wade is a 72 y.o. male with a history of hypertension hyperlipidemia presenting with a very small non-STEMI on November 13, 2017 followed by placement of a drug-eluting stent for a high-grade stenosis in the mid right coronary artery.  Also had a moderate 40% stenosis in the left circumflex coronary artery but has preserved left ventricular systolic function.   He is doing quite well and has not had any new episodes of chest pain at rest or with activity.  He mowed his own lawn with a push mower without any challenges.  He has a home gym, but has not really been using it.  The patient specifically denies any chest pain at rest exertion, dyspnea at rest or with exertion, orthopnea, paroxysmal nocturnal dyspnea, syncope, palpitations, focal neurological deficits, intermittent claudication, lower extremity edema, unexplained weight gain, cough, hemoptysis or wheezing.  She has not been troubled by palpitations even though he is not taking a beta-blocker.  His resting heart rate is in the 50s.  He is eating a healthy diet and has a healthy weight.   Past Medical History:  Diagnosis Date   Arthritis    Hyperlipidemia    Hypertension    Past Surgical History:  Procedure Laterality Date   APPENDECTOMY     CORONARY STENT INTERVENTION N/A 11/13/2017   Procedure: CORONARY STENT INTERVENTION;  Surgeon: Sherren Mocha, MD;  Location: [place] [facility];  Service: Cardiovascular;  Laterality: N/A;   LEFT HEART CATH AND CORONARY ANGIOGRAPHY N/A 11/13/2017   Procedure: LEFT HEART CATH AND CORONARY ANGIOGRAPHY;  Surgeon: Sherren Mocha, MD;  Location: [place] [facility];  Service: Cardiovascular;   Laterality: N/A;   LEFT HEART CATH AND CORONARY ANGIOGRAPHY N/A 04/21/2020   Procedure: LEFT HEART CATH AND CORONARY ANGIOGRAPHY;  Surgeon: Troy Sine, MD;  Location: [place] [facility];  Service: Cardiovascular;  Laterality: N/A;   ROTATOR CUFF REPAIR Right    Dr. Tamera Punt     Current Meds  Medication Sig   amLODipine (NORVASC) 5 MG tablet Take 1 tablet (5 mg total) by mouth at bedtime.   Ascorbic Acid (VITAMIN C ADULT GUMMIES PO) Take 1 capsule by mouth daily in the afternoon.   aspirin EC 81 MG tablet Take 81 mg by mouth daily.   cholecalciferol (VITAMIN D3) 10 MCG (400 UNIT) TABS tablet Take 400 Units by mouth daily.   ezetimibe (ZETIA) 10 MG tablet Take 1 tablet (10 mg total) by mouth daily. Keep scheduled appointment for further refills   meloxicam (MOBIC) 15 MG tablet Take 15 mg by mouth daily.   Multiple Vitamins-Minerals (ONE-A-DAY MENS 50+ ADVANTAGE) TABS Take 1 tablet by mouth daily.   Multiple Vitamins-Minerals (ZINC PO) Take 1 capsule by mouth daily in the afternoon.   nitroGLYCERIN (NITROSTAT) 0.4 MG SL tablet Place 1 tablet (0.4 mg total) under the tongue every 5 (five) minutes x 3 doses as needed for chest pain.   ramipril (ALTACE) 10 MG capsule TAKE 1 CAPSULE BY MOUTH EVERY DAY   rosuvastatin (CRESTOR) 40 MG tablet Take 40 mg by mouth daily.   sildenafil (VIAGRA) 50 MG tablet Take 50 mg by mouth daily as needed.  tamsulosin (FLOMAX) 0.4 MG CAPS capsule Take 0.4 mg by mouth daily.     Allergies:   Patient has no known allergies.   Social History   Tobacco Use   Smoking status: Never   Smokeless tobacco: Never  Substance Use Topics   Alcohol use: Yes    Comment: Socially    Drug use: No     Family Hx: The patient's family history includes Hypertension in his father. There is no history of Colon cancer, Rectal cancer, Stomach cancer, Esophageal cancer, Inflammatory bowel disease, Liver disease, or Pancreatic cancer.  ROS:   Please see the history of  present illness.   All other systems are reviewed and are negative.  Prior CV studies:   The following studies were reviewed today:  Labs from 12/04/2019 from PCP, unfortunately no lipid profile was performed  Labs/Other Tests and Data Reviewed:    EKG: not ordered today.  The tracing from 05/10/2022 is personally viewed and shows sinus bradycardia and early repolarization, no signs of previous infarction, no acute ischemic changes.  QT 460 ms  Recent Labs: 04/30/2022: BUN 11; Creatinine, Ser 1.20; Hemoglobin 14.5; Platelets 196; Potassium 3.6; Sodium 139   Recent Lipid Panel Lab Results  Component Value Date/Time   CHOL 254 (H) 05/03/2022 10:31 AM   TRIG 67 05/03/2022 10:31 AM   HDL 77 05/03/2022 10:31 AM   CHOLHDL 3.3 05/03/2022 10:31 AM   CHOLHDL 3.0 11/14/2017 03:22 AM   LDLCALC 166 (H) 05/03/2022 10:31 AM    Wt Readings from Last 3 Encounters:  08/13/22 182 lb 6.4 oz (82.7 kg)  05/08/22 186 lb (84.4 kg)  05/03/22 186 lb (84.4 kg)     Objective:    Vital Signs:  BP (!) 142/74   Pulse (!) 54   Ht 5\' 11"  (1.803 m)   Wt 182 lb 6.4 oz (82.7 kg)   SpO2 97%   BMI 25.44 kg/m     General: Alert, oriented x3, no distress Head: no evidence of trauma, PERRL, EOMI, no exophtalmos or lid lag, no myxedema, no xanthelasma; normal ears, nose and oropharynx Neck: normal jugular venous pulsations and no hepatojugular reflux; brisk carotid pulses without delay and no carotid bruits Chest: clear to auscultation, no signs of consolidation by percussion or palpation, normal fremitus, symmetrical and full respiratory excursions Cardiovascular: normal position and quality of the apical impulse, regular rhythm, normal first and second heart sounds, no murmurs, rubs or gallops Abdomen: no tenderness or distention, no masses by palpation, no abnormal pulsatility or arterial bruits, normal bowel sounds, no hepatosplenomegaly Extremities: no clubbing, cyanosis or edema; 2+ radial, ulnar and  brachial pulses bilaterally; 2+ right femoral, posterior tibial and dorsalis pedis pulses; 2+ left femoral, posterior tibial and dorsalis pedis pulses; no subclavian or femoral bruits Neurological: grossly nonfocal Psych: Normal mood and affect Appears fit, younger than stated age.    ASSESSMENT & PLAN:    No diagnosis found.    CAD: Normal recent treadmill stress test.  Currently asymptomatic.  On aspirin and statin, not on beta-blockers due to baseline bradycardia. Palpitations: These have not bothered him again.  Okay to take a beta-blocker "as needed. HTN: Very well-controlled. HLP: Time to recheck his labs today on combination rosuvastatin and ezetimibe.  Encouraged regular physical activity as well. ED: Both sublingual nitroglycerin and sildenafil are on his list of medications.  He has not taken either 1 in months.  Reviewed the fact that they should never be taken within 24 hours of each  other due to the risk of very dangerous hypotension.  There are no Patient Instructions on file for this visit.  Signed, Sanda Klein, MD  08/13/2022 9:04 AM    [facility]

## 2022-08-14 LAB — LIPID PANEL
Chol/HDL Ratio: 2.2 ratio (ref 0.0–5.0)
Cholesterol, Total: 140 mg/dL (ref 100–199)
HDL: 65 mg/dL (ref >39)
LDL Chol Calc (NIH): 58 mg/dL (ref 0–99)
Triglycerides: 93 mg/dL (ref 0–149)
VLDL Cholesterol Cal: 17 mg/dL (ref 5–40)

## 2022-08-19 ENCOUNTER — Other Ambulatory Visit: Payer: Self-pay | Admitting: Cardiovascular Disease

## 2022-09-25 DIAGNOSIS — M5431 Sciatica, right side: Secondary | ICD-10-CM | POA: Diagnosis not present

## 2022-09-25 DIAGNOSIS — M5432 Sciatica, left side: Secondary | ICD-10-CM | POA: Diagnosis not present

## 2022-09-25 DIAGNOSIS — M48062 Spinal stenosis, lumbar region with neurogenic claudication: Secondary | ICD-10-CM | POA: Diagnosis not present

## 2022-09-26 ENCOUNTER — Other Ambulatory Visit: Payer: Self-pay | Admitting: Orthopedic Surgery

## 2022-09-26 DIAGNOSIS — M5431 Sciatica, right side: Secondary | ICD-10-CM

## 2022-09-26 DIAGNOSIS — M5432 Sciatica, left side: Secondary | ICD-10-CM

## 2022-09-26 DIAGNOSIS — M48062 Spinal stenosis, lumbar region with neurogenic claudication: Secondary | ICD-10-CM

## 2022-10-01 ENCOUNTER — Encounter: Payer: Self-pay | Admitting: Orthopedic Surgery

## 2022-10-07 ENCOUNTER — Ambulatory Visit
Admission: RE | Admit: 2022-10-07 | Discharge: 2022-10-07 | Disposition: A | Discharge status: Home / Self Care | Payer: Medicare PPO | Source: Ambulatory Visit | Attending: Orthopedic Surgery | Admitting: Orthopedic Surgery

## 2022-10-07 DIAGNOSIS — M5431 Sciatica, right side: Secondary | ICD-10-CM

## 2022-10-07 DIAGNOSIS — M5432 Sciatica, left side: Secondary | ICD-10-CM

## 2022-10-07 DIAGNOSIS — M48062 Spinal stenosis, lumbar region with neurogenic claudication: Secondary | ICD-10-CM

## 2022-10-07 DIAGNOSIS — M48061 Spinal stenosis, lumbar region without neurogenic claudication: Secondary | ICD-10-CM | POA: Diagnosis not present

## 2022-10-11 DIAGNOSIS — M48062 Spinal stenosis, lumbar region with neurogenic claudication: Secondary | ICD-10-CM | POA: Diagnosis not present

## 2022-10-11 DIAGNOSIS — G959 Disease of spinal cord, unspecified: Secondary | ICD-10-CM | POA: Diagnosis not present

## 2022-10-14 DIAGNOSIS — S8391XA Sprain of unspecified site of right knee, initial encounter: Secondary | ICD-10-CM | POA: Diagnosis not present

## 2022-10-16 ENCOUNTER — Other Ambulatory Visit: Payer: Self-pay | Admitting: Neurosurgery

## 2022-10-16 DIAGNOSIS — G959 Disease of spinal cord, unspecified: Secondary | ICD-10-CM

## 2022-10-17 ENCOUNTER — Ambulatory Visit
Admission: RE | Admit: 2022-10-17 | Discharge: 2022-10-17 | Disposition: A | Discharge status: Home / Self Care | Payer: Medicare PPO | Source: Ambulatory Visit | Attending: Neurosurgery | Admitting: Neurosurgery

## 2022-10-17 ENCOUNTER — Ambulatory Visit: Payer: Medicare PPO | Admitting: Physician Assistant

## 2022-10-17 DIAGNOSIS — G959 Disease of spinal cord, unspecified: Secondary | ICD-10-CM

## 2022-10-17 DIAGNOSIS — M4802 Spinal stenosis, cervical region: Secondary | ICD-10-CM | POA: Diagnosis not present

## 2022-10-17 DIAGNOSIS — M25561 Pain in right knee: Secondary | ICD-10-CM | POA: Diagnosis not present

## 2022-10-17 DIAGNOSIS — M25461 Effusion, right knee: Secondary | ICD-10-CM | POA: Diagnosis not present

## 2022-10-18 ENCOUNTER — Encounter: Payer: Self-pay | Admitting: Family Medicine

## 2022-10-18 ENCOUNTER — Other Ambulatory Visit: Payer: Self-pay | Admitting: Family Medicine

## 2022-10-18 DIAGNOSIS — M25561 Pain in right knee: Secondary | ICD-10-CM

## 2022-10-19 ENCOUNTER — Ambulatory Visit
Admission: RE | Admit: 2022-10-19 | Discharge: 2022-10-19 | Disposition: A | Discharge status: Home / Self Care | Payer: Medicare PPO | Source: Ambulatory Visit | Attending: Family Medicine | Admitting: Family Medicine

## 2022-10-19 DIAGNOSIS — M1711 Unilateral primary osteoarthritis, right knee: Secondary | ICD-10-CM | POA: Diagnosis not present

## 2022-10-19 DIAGNOSIS — M25561 Pain in right knee: Secondary | ICD-10-CM

## 2022-10-24 ENCOUNTER — Ambulatory Visit (INDEPENDENT_AMBULATORY_CARE_PROVIDER_SITE_OTHER): Payer: Medicare PPO | Admitting: Orthopaedic Surgery

## 2022-10-24 DIAGNOSIS — M25561 Pain in right knee: Secondary | ICD-10-CM

## 2022-10-24 MED ORDER — BUPIVACAINE HCL 0.5 % IJ SOLN
2.0000 mL | INTRAMUSCULAR | Status: AC | PRN
  Administered 2022-10-24: 2 mL via INTRA_ARTICULAR

## 2022-10-24 MED ORDER — LIDOCAINE HCL 1 % IJ SOLN
2.0000 mL | INTRAMUSCULAR | Status: AC | PRN
  Administered 2022-10-24: 2 mL

## 2022-10-24 MED ORDER — METHYLPREDNISOLONE ACETATE 40 MG/ML IJ SUSP
40.0000 mg | INTRAMUSCULAR | Status: AC | PRN
  Administered 2022-10-24: 40 mg via INTRA_ARTICULAR

## 2022-10-24 NOTE — Progress Notes (Signed)
Office Visit Note   Patient: Kevin Wade           Date of Birth: December 11, 1950           MRN: [id number] Visit Date: 10/24/2022              Requested by: Alysia Penna, MD [address] [place],  [place] [address] PCP: Alysia Penna, MD   Assessment & Plan: Visit Diagnoses:  1. Acute pain of right knee     Plan: Impression is right knee pain.  MRI independently reviewed and interpreted shows acute chondral injury to the medial femoral condyle.  There is degenerative changes throughout the knee.  Possible meniscal capsular tear of the medial meniscus.  Based on findings and impression I recommend a cortisone injection in 6 weeks of activity modification.  Follow-Up Instructions: No follow-ups on file.   Orders:  Orders Placed This Encounter  Procedures   Large Joint Inj: R knee   No orders of the defined types were placed in this encounter.     Procedures: Large Joint Inj: R knee on 10/24/2022 10:19 AM Indications: pain Details: 22 G needle  Arthrogram: No  Medications: 40 mg methylPREDNISolone acetate 40 MG/ML; 2 mL lidocaine 1 %; 2 mL bupivacaine 0.5 % Consent was given by the patient. Patient was prepped and draped in the usual sterile fashion.       Clinical Data: No additional findings.   Subjective: Chief Complaint  Patient presents with   Right Knee - Pain    HPI Patient is a 72 year old gentleman here for evaluation of acute right knee pain for a week.  He was stepping into the house and felt something pop last Saturday.  He has had trouble with weightbearing but part of it is due to lack of confidence.  Feels that the knee feels better when its slightly flexed.  He has been using a single crutch.  He feels pain in the medial and posterior portion of the knee.  Denies any mechanical symptoms. Review of Systems  Constitutional: Negative.   HENT: Negative.    Eyes: Negative.   Respiratory: Negative.    Cardiovascular: Negative.    Gastrointestinal: Negative.   Endocrine: Negative.   Genitourinary: Negative.   Skin: Negative.   Allergic/Immunologic: Negative.   Neurological: Negative.   Hematological: Negative.   Psychiatric/Behavioral: Negative.    All other systems reviewed and are negative.    Objective: Vital Signs: There were no vitals taken for this visit.  Physical Exam Vitals and nursing note reviewed.  Constitutional:      Appearance: He is well-developed.  HENT:     Head: Normocephalic and atraumatic.  Eyes:     Pupils: Pupils are equal, round, and reactive to light.  Pulmonary:     Effort: Pulmonary effort is normal.  Abdominal:     Palpations: Abdomen is soft.  Musculoskeletal:        General: Normal range of motion.     Cervical back: Neck supple.  Skin:    General: Skin is warm.  Neurological:     Mental Status: He is alert and oriented to person, place, and time.  Psychiatric:        Behavior: Behavior normal.        Thought Content: Thought content normal.        Judgment: Judgment normal.     Ortho Exam Examination of right knee shows trace joint effusion.  Slight medial joint line tenderness.  Normal range of  motion.  Collaterals and cruciates are stable. Specialty Comments:  No specialty comments available.  Imaging: No results found.   PMFS History: Patient Active Problem List   Diagnosis Date Noted   Abdominal discomfort 10/26/2021   Fecal urgency 10/26/2021   History of colon polyps 10/26/2021   Colon cancer screening 10/26/2021   Chronic constipation 10/26/2021   Chest pain, patent stent, very mild CAD  04/21/2020   Coronary artery disease involving native coronary artery of native heart with angina pectoris (HCC) 03/07/2018   Dyspnea 12/10/2017   CAD S/P percutaneous coronary angioplasty 11/22/2017   Bradycardia, sinus 11/22/2017   Bradycardia 11/13/2017   Angina pectoris syndrome (HCC) 11/13/2017   Non-ST elevation (NSTEMI) myocardial infarction (HCC)  11/12/2017   Essential hypertension 11/12/2017   Hypercholesterolemia 11/12/2017   Past Medical History:  Diagnosis Date   Arthritis    Hyperlipidemia    Hypertension     Family History  Problem Relation Age of Onset   Hypertension Father    Colon cancer Neg Hx    Rectal cancer Neg Hx    Stomach cancer Neg Hx    Esophageal cancer Neg Hx    Inflammatory bowel disease Neg Hx    Liver disease Neg Hx    Pancreatic cancer Neg Hx     Past Surgical History:  Procedure Laterality Date   APPENDECTOMY     CORONARY STENT INTERVENTION N/A 11/13/2017   Procedure: CORONARY STENT INTERVENTION;  Surgeon: Tonny Bollman, MD;  Location: [facility];  Service: Cardiovascular;  Laterality: N/A;   LEFT HEART CATH AND CORONARY ANGIOGRAPHY N/A 11/13/2017   Procedure: LEFT HEART CATH AND CORONARY ANGIOGRAPHY;  Surgeon: Tonny Bollman, MD;  Location: [facility];  Service: Cardiovascular;  Laterality: N/A;   LEFT HEART CATH AND CORONARY ANGIOGRAPHY N/A 04/21/2020   Procedure: LEFT HEART CATH AND CORONARY ANGIOGRAPHY;  Surgeon: Lennette Bihari, MD;  Location: [facility];  Service: Cardiovascular;  Laterality: N/A;   ROTATOR CUFF REPAIR Right    Dr. Ave Filter   Social History   Occupational History   Not on file  Tobacco Use   Smoking status: Never   Smokeless tobacco: Never  Substance and Sexual Activity   Alcohol use: Yes    Comment: Socially    Drug use: No   Sexual activity: Not on file

## 2022-10-25 ENCOUNTER — Other Ambulatory Visit: Payer: Self-pay | Admitting: Neurosurgery

## 2022-10-25 DIAGNOSIS — G959 Disease of spinal cord, unspecified: Secondary | ICD-10-CM | POA: Diagnosis not present

## 2022-10-31 ENCOUNTER — Ambulatory Visit: Admit: 2022-10-31 | Payer: Medicare PPO | Admitting: Neurosurgery

## 2022-10-31 DIAGNOSIS — M545 Low back pain, unspecified: Secondary | ICD-10-CM | POA: Diagnosis not present

## 2022-10-31 DIAGNOSIS — M542 Cervicalgia: Secondary | ICD-10-CM | POA: Insufficient documentation

## 2022-10-31 DIAGNOSIS — G959 Disease of spinal cord, unspecified: Secondary | ICD-10-CM | POA: Diagnosis not present

## 2022-10-31 SURGERY — ANTERIOR CERVICAL DECOMPRESSION/DISCECTOMY FUSION 2 LEVELS
Anesthesia: General

## 2022-11-06 ENCOUNTER — Telehealth: Payer: Self-pay

## 2022-11-06 ENCOUNTER — Telehealth: Payer: Self-pay | Admitting: Cardiovascular Disease

## 2022-11-06 DIAGNOSIS — M5023 Other cervical disc displacement, cervicothoracic region: Secondary | ICD-10-CM | POA: Diagnosis not present

## 2022-11-06 DIAGNOSIS — M542 Cervicalgia: Secondary | ICD-10-CM | POA: Diagnosis not present

## 2022-11-06 DIAGNOSIS — M5031 Other cervical disc degeneration,  high cervical region: Secondary | ICD-10-CM | POA: Diagnosis not present

## 2022-11-06 DIAGNOSIS — M47811 Spondylosis without myelopathy or radiculopathy, occipito-atlanto-axial region: Secondary | ICD-10-CM | POA: Diagnosis not present

## 2022-11-06 DIAGNOSIS — M50323 Other cervical disc degeneration at C6-C7 level: Secondary | ICD-10-CM | POA: Diagnosis not present

## 2022-11-06 DIAGNOSIS — G959 Disease of spinal cord, unspecified: Secondary | ICD-10-CM | POA: Diagnosis not present

## 2022-11-06 NOTE — Telephone Encounter (Signed)
Spoke with patient who is agreeable to do a tele visit on 6/26 at 10:40 am. Med rec and consent done.

## 2022-11-06 NOTE — Telephone Encounter (Signed)
   Name: Kevin Wade  DOB: 04/26/1951  MRN: [id number]  Primary Cardiologist: Thurmon Fair, MD   Preoperative team, please contact this patient and set up a phone call appointment for further preoperative risk assessment. Please obtain consent and complete medication review. Thank you for your help.  I confirm that guidance regarding antiplatelet and oral anticoagulation therapy has been completed and, if necessary, noted below.  None requested.   Ronney Asters, NP 11/06/2022, 2:05 PM [facility]

## 2022-11-06 NOTE — Telephone Encounter (Signed)
   Pre-operative Risk Assessment    Patient Name: Kevin Wade  DOB: Aug 26, 1950 MRN: [id number]      Request for Surgical Clearance    Procedure:  Anterior Cervical  Discectomy  Date of Surgery:  11-12-22                                  Surgeon:  Dr Esther Hardy Surgeon's Group or Practice Name:   Phone number:  [phone] Fax number:  [phone]- please fax, they are not in EPIC   Type of Clearance Requested:   - Medical    Type of Anesthesia:  General    Additional requests/questions:    Signed, Laurence Ferrari   11/06/2022, 1:49 PM

## 2022-11-06 NOTE — Telephone Encounter (Signed)
  Patient Consent for Virtual Visit        Kevin Wade has provided verbal consent on 11/06/2022 for a virtual visit (video or telephone).   CONSENT FOR VIRTUAL VISIT FOR:  Kevin Wade  By participating in this virtual visit I agree to the following:  I hereby voluntarily request, consent and authorize [facility] and its employed or contracted physicians, physician assistants, nurse practitioners or other licensed health care professionals (the Practitioner), to provide me with telemedicine health care services (the "Services") as deemed necessary by the treating Practitioner. I acknowledge and consent to receive the Services by the Practitioner via telemedicine. I understand that the telemedicine visit will involve communicating with the Practitioner through live audiovisual communication technology and the disclosure of certain medical information by electronic transmission. I acknowledge that I have been given the opportunity to request an in-person assessment or other available alternative prior to the telemedicine visit and am voluntarily participating in the telemedicine visit.  I understand that I have the right to withhold or withdraw my consent to the use of telemedicine in the course of my care at any time, without affecting my right to future care or treatment, and that the Practitioner or I may terminate the telemedicine visit at any time. I understand that I have the right to inspect all information obtained and/or recorded in the course of the telemedicine visit and may receive copies of available information for a reasonable fee.  I understand that some of the potential risks of receiving the Services via telemedicine include:  Delay or interruption in medical evaluation due to technological equipment failure or disruption; Information transmitted may not be sufficient (e.g. poor resolution of images) to allow for appropriate medical decision making by the Practitioner;  and/or  In rare instances, security protocols could fail, causing a breach of personal health information.  Furthermore, I acknowledge that it is my responsibility to provide information about my medical history, conditions and care that is complete and accurate to the best of my ability. I acknowledge that Practitioner's advice, recommendations, and/or decision may be based on factors not within their control, such as incomplete or inaccurate data provided by me or distortions of diagnostic images or specimens that may result from electronic transmissions. I understand that the practice of medicine is not an exact science and that Practitioner makes no warranties or guarantees regarding treatment outcomes. I acknowledge that a copy of this consent can be made available to me via my patient portal [facility] MyChart), or I can request a printed copy by calling the office of [facility].    I understand that my insurance will be billed for this visit.   I have read or had this consent read to me. I understand the contents of this consent, which adequately explains the benefits and risks of the Services being provided via telemedicine.  I have been provided ample opportunity to ask questions regarding this consent and the Services and have had my questions answered to my satisfaction. I give my informed consent for the services to be provided through the use of telemedicine in my medical care

## 2022-11-07 ENCOUNTER — Ambulatory Visit: Payer: Medicare PPO | Attending: Cardiology

## 2022-11-07 DIAGNOSIS — I2510 Atherosclerotic heart disease of native coronary artery without angina pectoris: Secondary | ICD-10-CM | POA: Diagnosis not present

## 2022-11-07 DIAGNOSIS — R001 Bradycardia, unspecified: Secondary | ICD-10-CM | POA: Diagnosis not present

## 2022-11-07 DIAGNOSIS — I10 Essential (primary) hypertension: Secondary | ICD-10-CM | POA: Diagnosis not present

## 2022-11-07 DIAGNOSIS — Z01810 Encounter for preprocedural cardiovascular examination: Secondary | ICD-10-CM

## 2022-11-07 DIAGNOSIS — Z9861 Coronary angioplasty status: Secondary | ICD-10-CM | POA: Diagnosis not present

## 2022-11-07 NOTE — Progress Notes (Signed)
Virtual Visit via Telephone Note   Because of Kevin Wade co-morbid illnesses, he is at least at moderate risk for complications without adequate follow up.  This format is felt to be most appropriate for this patient at this time.  The patient did not have access to video technology/had technical difficulties with video requiring transitioning to audio format only (telephone).  All issues noted in this document were discussed and addressed.  No physical exam could be performed with this format.  Please refer to the patient's chart for his consent to telehealth for [facility].  Evaluation Performed:  Preoperative cardiovascular risk assessment _____________   Date:  11/07/2022   Patient ID:  Kevin Wade, DOB September 06, 1950, MRN [id number] Patient Location:  Home Provider location:   Office  Primary Care Provider:  Alysia Penna, MD Primary Cardiologist:  Thurmon Fair, MD  Chief Complaint / Patient Profile   72 y.o. y/o male with a h/o NSTEMI, HLD, HTN who is pending anterior cervical discectomy and presents today for telephonic preoperative cardiovascular risk assessment.  History of Present Illness    Kevin Wade is a 72 y.o. male who presents via audio/video conferencing for a telehealth visit today.  Pt was last seen in cardiology clinic on 08/13/2022 by Dr. Royann Shivers.  At that time Cyan Moultrie was doing well with no new complaints of chest pain.  He was working in his yard and going to the gym with no cardiac complaints. The patient is now pending procedure as outlined above. Since his last visit, he has been doing well with no new cardiac complaints.  Past Medical History    Past Medical History:  Diagnosis Date   Arthritis    Hyperlipidemia    Hypertension    Past Surgical History:  Procedure Laterality Date   APPENDECTOMY     CORONARY STENT INTERVENTION N/A 11/13/2017   Procedure: CORONARY STENT INTERVENTION;  Surgeon: Tonny Bollman, MD;   Location: [facility] INVASIVE [facility];  Service: Cardiovascular;  Laterality: N/A;   LEFT HEART CATH AND CORONARY ANGIOGRAPHY N/A 11/13/2017   Procedure: LEFT HEART CATH AND CORONARY ANGIOGRAPHY;  Surgeon: Tonny Bollman, MD;  Location: [facility] INVASIVE CV LAB;  Service: Cardiovascular;  Laterality: N/A;   LEFT HEART CATH AND CORONARY ANGIOGRAPHY N/A 04/21/2020   Procedure: LEFT HEART CATH AND CORONARY ANGIOGRAPHY;  Surgeon: Lennette Bihari, MD;  Location: [facility] INVASIVE [facility] LAB;  Service: Cardiovascular;  Laterality: N/A;   ROTATOR CUFF REPAIR Right    Dr. Ave Filter    Allergies  No Known Allergies  Home Medications    Prior to Admission medications   Medication Sig Start Date End Date Taking? Authorizing Provider  amLODipine (NORVASC) 5 MG tablet Take 1 tablet (5 mg total) by mouth at bedtime. 04/22/20   Leone Brand, NP  Ascorbic Acid (VITAMIN C ADULT GUMMIES PO) Take 1 capsule by mouth daily in the afternoon.    [provider]  aspirin EC 81 MG tablet Take 81 mg by mouth daily.    [provider]  cholecalciferol (VITAMIN D3) 10 MCG (400 UNIT) TABS tablet Take 400 Units by mouth daily.    [provider]  ezetimibe (ZETIA) 10 MG tablet TAKE 1 TABLET BY MOUTH DAILY ** KEEP APPOINTMENT FOR FUTURE REFILLS ** 08/21/22   Croitoru, Mihai, MD  meloxicam (MOBIC) 15 MG tablet Take 15 mg by mouth daily. 04/28/22   [provider]  Multiple Vitamins-Minerals (ONE-A-DAY MENS 50+ ADVANTAGE) TABS Take 1 tablet by mouth daily.  [provider]  Multiple Vitamins-Minerals (ZINC PO) Take 1 capsule by mouth daily in the afternoon.    [provider]  nitroGLYCERIN (NITROSTAT) 0.4 MG SL tablet Place 1 tablet (0.4 mg total) under the tongue every 5 (five) minutes x 3 doses as needed for chest pain. 11/14/17   Arrien, York Ram, MD  ramipril (ALTACE) 10 MG capsule TAKE 1 CAPSULE BY MOUTH EVERY DAY 01/01/22   Croitoru, Mihai, MD  rosuvastatin (CRESTOR) 40 MG tablet  Take 40 mg by mouth daily.    [provider]  sildenafil (VIAGRA) 50 MG tablet Take 50 mg by mouth daily as needed. 01/20/22   [provider]  tamsulosin (FLOMAX) 0.4 MG CAPS capsule Take 0.4 mg by mouth daily. 02/09/22   [provider]    Physical Exam    Vital Signs:  Roverto Bodmer does not have vital signs available for review today.  Given telephonic nature of communication, physical exam is limited. AAOx3. NAD. Normal affect.  Speech and respirations are unlabored.  Accessory Clinical Findings    None  Assessment & Plan    1.  Preoperative Cardiovascular Risk Assessment:  Patient's RCRI score is 0.9%  The patient affirms he has been doing well without any new cardiac symptoms. They are able to achieve 6 METS without cardiac limitations. Therefore, based on ACC/AHA guidelines, the patient would be at acceptable risk for the planned procedure without further cardiovascular testing. The patient was advised that if he develops new symptoms prior to surgery to contact our office to arrange for a follow-up visit, and he verbalized understanding.   The patient was advised that if he develops new symptoms prior to surgery to contact our office to arrange for a follow-up visit, and he verbalized understanding.  Regarding ASA therapy,  it may be stopped 5-7 days prior to surgery with a plan to resume it as soon as felt to be feasible from a surgical standpoint in the post-operative period.   A copy of this note will be routed to requesting surgeon.  Time:   Today, I have spent 6 minutes with the patient with telehealth technology discussing medical history, symptoms, and management plan.     Napoleon Form, Leodis Rains, NP  11/07/2022, 7:09 AM

## 2022-11-09 DIAGNOSIS — R7989 Other specified abnormal findings of blood chemistry: Secondary | ICD-10-CM | POA: Diagnosis not present

## 2022-11-09 DIAGNOSIS — R3589 Other polyuria: Secondary | ICD-10-CM | POA: Diagnosis not present

## 2022-11-09 DIAGNOSIS — R634 Abnormal weight loss: Secondary | ICD-10-CM | POA: Diagnosis not present

## 2022-11-09 DIAGNOSIS — I10 Essential (primary) hypertension: Secondary | ICD-10-CM | POA: Diagnosis not present

## 2022-11-09 DIAGNOSIS — N401 Enlarged prostate with lower urinary tract symptoms: Secondary | ICD-10-CM | POA: Diagnosis not present

## 2022-11-09 DIAGNOSIS — E785 Hyperlipidemia, unspecified: Secondary | ICD-10-CM | POA: Diagnosis not present

## 2022-11-09 DIAGNOSIS — I2584 Coronary atherosclerosis due to calcified coronary lesion: Secondary | ICD-10-CM | POA: Diagnosis not present

## 2022-11-12 HISTORY — PX: ANTERIOR FUSION CERVICAL SPINE: SUR626

## 2022-11-14 ENCOUNTER — Other Ambulatory Visit: Payer: Self-pay | Admitting: Family Medicine

## 2022-11-14 DIAGNOSIS — R7989 Other specified abnormal findings of blood chemistry: Secondary | ICD-10-CM

## 2022-11-16 ENCOUNTER — Other Ambulatory Visit: Payer: Self-pay | Admitting: Cardiovascular Disease

## 2022-11-19 DIAGNOSIS — G959 Disease of spinal cord, unspecified: Secondary | ICD-10-CM | POA: Diagnosis not present

## 2022-11-19 DIAGNOSIS — G992 Myelopathy in diseases classified elsewhere: Secondary | ICD-10-CM | POA: Diagnosis not present

## 2022-11-19 DIAGNOSIS — M4802 Spinal stenosis, cervical region: Secondary | ICD-10-CM | POA: Diagnosis not present

## 2022-11-19 DIAGNOSIS — M2578 Osteophyte, vertebrae: Secondary | ICD-10-CM | POA: Diagnosis not present

## 2022-11-19 DIAGNOSIS — M542 Cervicalgia: Secondary | ICD-10-CM | POA: Diagnosis not present

## 2022-11-19 DIAGNOSIS — Z981 Arthrodesis status: Secondary | ICD-10-CM | POA: Insufficient documentation

## 2022-11-20 DIAGNOSIS — Z4789 Encounter for other orthopedic aftercare: Secondary | ICD-10-CM | POA: Diagnosis not present

## 2022-11-21 DIAGNOSIS — G992 Myelopathy in diseases classified elsewhere: Secondary | ICD-10-CM | POA: Diagnosis not present

## 2022-11-21 DIAGNOSIS — R1312 Dysphagia, oropharyngeal phase: Secondary | ICD-10-CM | POA: Diagnosis not present

## 2022-11-21 DIAGNOSIS — M7989 Other specified soft tissue disorders: Secondary | ICD-10-CM | POA: Diagnosis not present

## 2022-11-21 DIAGNOSIS — M4802 Spinal stenosis, cervical region: Secondary | ICD-10-CM | POA: Diagnosis not present

## 2022-11-21 DIAGNOSIS — M50322 Other cervical disc degeneration at C5-C6 level: Secondary | ICD-10-CM | POA: Diagnosis not present

## 2022-11-21 DIAGNOSIS — Z4659 Encounter for fitting and adjustment of other gastrointestinal appliance and device: Secondary | ICD-10-CM | POA: Diagnosis not present

## 2022-11-21 DIAGNOSIS — E785 Hyperlipidemia, unspecified: Secondary | ICD-10-CM | POA: Diagnosis not present

## 2022-11-21 DIAGNOSIS — M542 Cervicalgia: Secondary | ICD-10-CM | POA: Diagnosis not present

## 2022-11-21 DIAGNOSIS — G9600 Cerebrospinal fluid leak, unspecified: Secondary | ICD-10-CM | POA: Diagnosis not present

## 2022-11-21 DIAGNOSIS — R221 Localized swelling, mass and lump, neck: Secondary | ICD-10-CM | POA: Diagnosis not present

## 2022-11-21 DIAGNOSIS — Z4789 Encounter for other orthopedic aftercare: Secondary | ICD-10-CM | POA: Diagnosis not present

## 2022-11-21 DIAGNOSIS — G9609 Other spinal cerebrospinal fluid leak: Secondary | ICD-10-CM | POA: Diagnosis not present

## 2022-11-21 DIAGNOSIS — M2578 Osteophyte, vertebrae: Secondary | ICD-10-CM | POA: Diagnosis not present

## 2022-11-21 DIAGNOSIS — G96198 Other disorders of meninges, not elsewhere classified: Secondary | ICD-10-CM | POA: Diagnosis not present

## 2022-11-21 DIAGNOSIS — Z981 Arthrodesis status: Secondary | ICD-10-CM | POA: Diagnosis not present

## 2022-11-21 DIAGNOSIS — M5412 Radiculopathy, cervical region: Secondary | ICD-10-CM | POA: Diagnosis not present

## 2022-11-21 DIAGNOSIS — I10 Essential (primary) hypertension: Secondary | ICD-10-CM | POA: Diagnosis not present

## 2022-11-21 DIAGNOSIS — G9741 Accidental puncture or laceration of dura during a procedure: Secondary | ICD-10-CM | POA: Diagnosis not present

## 2022-11-21 DIAGNOSIS — G9602 Spinal cerebrospinal fluid leak, spontaneous: Secondary | ICD-10-CM | POA: Diagnosis not present

## 2022-11-21 DIAGNOSIS — R633 Feeding difficulties, unspecified: Secondary | ICD-10-CM | POA: Diagnosis not present

## 2022-11-21 DIAGNOSIS — G9782 Other postprocedural complications and disorders of nervous system: Secondary | ICD-10-CM | POA: Diagnosis not present

## 2022-11-21 DIAGNOSIS — I252 Old myocardial infarction: Secondary | ICD-10-CM | POA: Diagnosis not present

## 2022-11-21 DIAGNOSIS — G959 Disease of spinal cord, unspecified: Secondary | ICD-10-CM | POA: Diagnosis not present

## 2022-11-21 DIAGNOSIS — G9611 Dural tear: Secondary | ICD-10-CM | POA: Insufficient documentation

## 2022-11-30 ENCOUNTER — Other Ambulatory Visit: Payer: Medicare PPO

## 2022-12-06 DIAGNOSIS — G9600 Cerebrospinal fluid leak, unspecified: Secondary | ICD-10-CM

## 2022-12-06 HISTORY — DX: Cerebrospinal fluid leak, unspecified: G96.00

## 2022-12-18 DIAGNOSIS — Z981 Arthrodesis status: Secondary | ICD-10-CM | POA: Diagnosis not present

## 2022-12-18 DIAGNOSIS — M5003 Cervical disc disorder with myelopathy, cervicothoracic region: Secondary | ICD-10-CM | POA: Diagnosis not present

## 2022-12-18 DIAGNOSIS — G9600 Cerebrospinal fluid leak, unspecified: Secondary | ICD-10-CM | POA: Diagnosis not present

## 2022-12-18 DIAGNOSIS — G9782 Other postprocedural complications and disorders of nervous system: Secondary | ICD-10-CM | POA: Diagnosis not present

## 2022-12-21 ENCOUNTER — Ambulatory Visit
Admission: RE | Admit: 2022-12-21 | Discharge: 2022-12-21 | Disposition: A | Discharge status: Home / Self Care | Payer: Medicare PPO | Source: Ambulatory Visit | Attending: Family Medicine | Admitting: Family Medicine

## 2022-12-21 DIAGNOSIS — R7989 Other specified abnormal findings of blood chemistry: Secondary | ICD-10-CM

## 2022-12-21 DIAGNOSIS — K829 Disease of gallbladder, unspecified: Secondary | ICD-10-CM | POA: Diagnosis not present

## 2022-12-21 DIAGNOSIS — K8020 Calculus of gallbladder without cholecystitis without obstruction: Secondary | ICD-10-CM | POA: Diagnosis not present

## 2023-01-28 DIAGNOSIS — S83241A Other tear of medial meniscus, current injury, right knee, initial encounter: Secondary | ICD-10-CM | POA: Diagnosis not present

## 2023-01-28 DIAGNOSIS — M1711 Unilateral primary osteoarthritis, right knee: Secondary | ICD-10-CM | POA: Diagnosis not present

## 2023-02-01 DIAGNOSIS — R262 Difficulty in walking, not elsewhere classified: Secondary | ICD-10-CM | POA: Diagnosis not present

## 2023-02-01 DIAGNOSIS — M25561 Pain in right knee: Secondary | ICD-10-CM | POA: Diagnosis not present

## 2023-02-05 DIAGNOSIS — E785 Hyperlipidemia, unspecified: Secondary | ICD-10-CM | POA: Diagnosis not present

## 2023-02-05 DIAGNOSIS — N401 Enlarged prostate with lower urinary tract symptoms: Secondary | ICD-10-CM | POA: Diagnosis not present

## 2023-02-05 DIAGNOSIS — M25561 Pain in right knee: Secondary | ICD-10-CM | POA: Diagnosis not present

## 2023-02-05 DIAGNOSIS — E7849 Other hyperlipidemia: Secondary | ICD-10-CM | POA: Diagnosis not present

## 2023-02-05 DIAGNOSIS — R3589 Other polyuria: Secondary | ICD-10-CM | POA: Diagnosis not present

## 2023-02-05 DIAGNOSIS — I10 Essential (primary) hypertension: Secondary | ICD-10-CM | POA: Diagnosis not present

## 2023-02-05 DIAGNOSIS — R262 Difficulty in walking, not elsewhere classified: Secondary | ICD-10-CM | POA: Diagnosis not present

## 2023-02-05 DIAGNOSIS — E611 Iron deficiency: Secondary | ICD-10-CM | POA: Diagnosis not present

## 2023-02-11 DIAGNOSIS — M25561 Pain in right knee: Secondary | ICD-10-CM | POA: Diagnosis not present

## 2023-02-11 DIAGNOSIS — R262 Difficulty in walking, not elsewhere classified: Secondary | ICD-10-CM | POA: Diagnosis not present

## 2023-02-12 DIAGNOSIS — Z23 Encounter for immunization: Secondary | ICD-10-CM | POA: Diagnosis not present

## 2023-02-12 DIAGNOSIS — I2584 Coronary atherosclerosis due to calcified coronary lesion: Secondary | ICD-10-CM | POA: Diagnosis not present

## 2023-02-12 DIAGNOSIS — Z0000 Encounter for general adult medical examination without abnormal findings: Secondary | ICD-10-CM | POA: Diagnosis not present

## 2023-02-12 DIAGNOSIS — N401 Enlarged prostate with lower urinary tract symptoms: Secondary | ICD-10-CM | POA: Diagnosis not present

## 2023-02-12 DIAGNOSIS — Z1339 Encounter for screening examination for other mental health and behavioral disorders: Secondary | ICD-10-CM | POA: Diagnosis not present

## 2023-02-12 DIAGNOSIS — Z1331 Encounter for screening for depression: Secondary | ICD-10-CM | POA: Diagnosis not present

## 2023-02-12 DIAGNOSIS — R82998 Other abnormal findings in urine: Secondary | ICD-10-CM | POA: Diagnosis not present

## 2023-02-12 DIAGNOSIS — I10 Essential (primary) hypertension: Secondary | ICD-10-CM | POA: Diagnosis not present

## 2023-02-12 DIAGNOSIS — E785 Hyperlipidemia, unspecified: Secondary | ICD-10-CM | POA: Diagnosis not present

## 2023-02-15 ENCOUNTER — Other Ambulatory Visit: Payer: Self-pay | Admitting: Orthopedic Surgery

## 2023-02-15 ENCOUNTER — Telehealth: Payer: Self-pay | Admitting: *Deleted

## 2023-02-15 NOTE — Telephone Encounter (Signed)
   Pre-operative Risk Assessment    Patient Name: Kevin Wade  DOB: [birth year] MRN: [id number]      Request for Surgical Clearance    Procedure:   Right Knee Arthroscopy, Partial Medial Menisectomy chondroplasty.  Date of Surgery:  Clearance 03/05/23                                 Surgeon:  Dr. Thornell Mule Surgeon's Group or Practice Name:  [facility] Phone number:  [phone] Fax number:  [phone]   Type of Clearance Requested:   - Medical  - Pharmacy:  Hold Aspirin Not Indicated   Type of Anesthesia:   Choice   Additional requests/questions:    Signed, Emmit Pomfret   02/15/2023, 12:33 PM

## 2023-02-25 DIAGNOSIS — R262 Difficulty in walking, not elsewhere classified: Secondary | ICD-10-CM | POA: Diagnosis not present

## 2023-02-25 DIAGNOSIS — M25561 Pain in right knee: Secondary | ICD-10-CM | POA: Diagnosis not present

## 2023-02-26 ENCOUNTER — Encounter (HOSPITAL_BASED_OUTPATIENT_CLINIC_OR_DEPARTMENT_OTHER): Payer: Self-pay | Admitting: Orthopedic Surgery

## 2023-02-27 ENCOUNTER — Encounter (HOSPITAL_BASED_OUTPATIENT_CLINIC_OR_DEPARTMENT_OTHER): Payer: Self-pay | Admitting: Orthopedic Surgery

## 2023-02-27 ENCOUNTER — Other Ambulatory Visit: Payer: Self-pay

## 2023-02-27 DIAGNOSIS — M48062 Spinal stenosis, lumbar region with neurogenic claudication: Secondary | ICD-10-CM | POA: Diagnosis not present

## 2023-02-27 DIAGNOSIS — Z981 Arthrodesis status: Secondary | ICD-10-CM | POA: Diagnosis not present

## 2023-02-28 ENCOUNTER — Telehealth: Payer: Self-pay | Admitting: *Deleted

## 2023-02-28 ENCOUNTER — Telehealth: Payer: Self-pay

## 2023-02-28 NOTE — Telephone Encounter (Signed)
Pt has been scheduled tele pre op appt 04/05/23. Med rec and consent are done.

## 2023-02-28 NOTE — Telephone Encounter (Signed)
Pt has been scheduled tele pre op appt 04/05/23. Med rec and consent are done.     Patient Consent for Virtual Visit        [name] has provided verbal consent on 02/28/2023 for a virtual visit (video or telephone).   CONSENT FOR VIRTUAL VISIT FOR:  Kevin Wade  By participating in this virtual visit I agree to the following:  I hereby voluntarily request, consent and authorize [facility] and its employed or contracted physicians, physician assistants, nurse practitioners or other licensed health care professionals (the Practitioner), to provide me with telemedicine health care services (the "Services") as deemed necessary by the treating Practitioner. I acknowledge and consent to receive the Services by the Practitioner via telemedicine. I understand that the telemedicine visit will involve communicating with the Practitioner through live audiovisual communication technology and the disclosure of certain medical information by electronic transmission. I acknowledge that I have been given the opportunity to request an in-person assessment or other available alternative prior to the telemedicine visit and am voluntarily participating in the telemedicine visit.  I understand that I have the right to withhold or withdraw my consent to the use of telemedicine in the course of my care at any time, without affecting my right to future care or treatment, and that the Practitioner or I may terminate the telemedicine visit at any time. I understand that I have the right to inspect all information obtained and/or recorded in the course of the telemedicine visit and may receive copies of available information for a reasonable fee.  I understand that some of the potential risks of receiving the Services via telemedicine include:  Delay or interruption in medical evaluation due to technological equipment failure or disruption; Information transmitted may not be sufficient (e.g. poor  resolution of images) to allow for appropriate medical decision making by the Practitioner; and/or  In rare instances, security protocols could fail, causing a breach of personal health information.  Furthermore, I acknowledge that it is my responsibility to provide information about my medical history, conditions and care that is complete and accurate to the best of my ability. I acknowledge that Practitioner's advice, recommendations, and/or decision may be based on factors not within their control, such as incomplete or inaccurate data provided by me or distortions of diagnostic images or specimens that may result from electronic transmissions. I understand that the practice of medicine is not an exact science and that Practitioner makes no warranties or guarantees regarding treatment outcomes. I acknowledge that a copy of this consent can be made available to me via my patient portal [facility] MyChart), or I can request a printed copy by calling the office of [facility].    I understand that my insurance will be billed for this visit.   I have read or had this consent read to me. I understand the contents of this consent, which adequately explains the benefits and risks of the Services being provided via telemedicine.  I have been provided ample opportunity to ask questions regarding this consent and the Services and have had my questions answered to my satisfaction. I give my informed consent for the services to be provided through the use of telemedicine in my medical care

## 2023-02-28 NOTE — Telephone Encounter (Signed)
..     Pre-operative Risk Assessment    Patient Name: Kevin Wade  DOB: Feb 07, 1951 MRN: [id number]      Request for Surgical Clearance    Procedure:   LAMINECTOMY SPINE LUMBAR  DISCECTOMY POSTERIOR MICRO L2/3 POSSIBLE L5/S1 RIGHT  Date of Surgery:  Clearance TBD                                 Surgeon:  DR Jules Schick Surgeon's Group or Practice Name:  [facility] Phone numberLoreli Slot Fax number:  [phone]   Type of Clearance Requested:   - Medical  - Pharmacy:  Hold Aspirin     Type of Anesthesia:  General    Additional requests/questions:   LAST O/V 08/13/22, NO NEW APPT  Signed, Renee Ramus   02/28/2023, 11:39 AM

## 2023-02-28 NOTE — Telephone Encounter (Signed)
   Name: Kevin Wade  DOB: [birth year]  MRN: [id number]  Primary Cardiologist: Thurmon Fair, MD  Chart reviewed as part of pre-operative protocol coverage. Because of [name] past medical history and time since last visit, he will require a follow-up telephone visit in order to better assess preoperative cardiovascular risk.  Pre-op covering staff: - Please schedule appointment and call patient to inform them. If patient already had an upcoming appointment within acceptable timeframe, please add "pre-op clearance" to the appointment notes so provider is aware. - Please contact requesting surgeon's office via preferred method (i.e, phone, fax) to inform them of need for appointment prior to surgery.  Patient can hold ASA 5 to 7 days prior to procedure and resume when safe to do so.  Sharlene Dory, PA-C  02/28/2023, 12:20 PM

## 2023-03-04 NOTE — Anesthesia Preprocedure Evaluation (Signed)
Anesthesia Evaluation  Patient identified by MRN, date of birth, ID band Patient awake    Reviewed: Allergy & Precautions, NPO status , Patient's Chart, lab work & pertinent test results  Airway Mallampati: I       Dental  (+) Poor Dentition   Pulmonary former smoker   Pulmonary exam normal        Cardiovascular hypertension, Pt. on medications + CAD, + Past MI and + Cardiac Stents  Normal cardiovascular exam     Neuro/Psych negative neurological ROS  negative psych ROS   GI/Hepatic negative GI ROS, Neg liver ROS,,,  Endo/Other  negative endocrine ROS    Renal/GU negative Renal ROS  negative genitourinary   Musculoskeletal  (+) Arthritis , Osteoarthritis,    Abdominal Normal abdominal exam  (+)   Peds  Hematology   Anesthesia Other Findings    The study is normal. The study is low risk.   No ST deviation was noted.   Left ventricular function is normal. Nuclear stress EF: 61 %. The left ventricular ejection fraction is normal (55-65%). End diastolic cavity size is normal. End systolic cavity size is normal.   Hypertensive blood pressure response noted during stress.   Patient exercised for 7 min and 46 sec. Maximum HR of 139 bpm. MPHR 93.0 %. Peak METS 8.6 .     Reproductive/Obstetrics                              Anesthesia Physical Anesthesia Plan  ASA: 3  Anesthesia Plan: General   Post-op Pain Management: Dilaudid IV   Induction: Intravenous  PONV Risk Score and Plan: 2 and Dexamethasone and Treatment may vary due to age or medical condition  Airway Management Planned: LMA  Additional Equipment: None  Intra-op Plan:   Post-operative Plan: Extubation in OR  Informed Consent: I have reviewed the patients History and Physical, chart, labs and discussed the procedure including the risks, benefits and alternatives for the proposed anesthesia with the patient or  authorized representative who has indicated his/her understanding and acceptance.     Dental advisory given  Plan Discussed with:   Anesthesia Plan Comments:         Anesthesia Quick Evaluation

## 2023-03-04 NOTE — Plan of Care (Signed)
CHL Tonsillectomy/Adenoidectomy, Postoperative PEDS care plan entered in error.

## 2023-03-05 ENCOUNTER — Ambulatory Visit (HOSPITAL_BASED_OUTPATIENT_CLINIC_OR_DEPARTMENT_OTHER)
Admission: RE | Admit: 2023-03-05 | Discharge: 2023-03-05 | Disposition: A | Discharge status: Home / Self Care | Payer: Medicare PPO | Attending: Orthopedic Surgery | Admitting: Orthopedic Surgery

## 2023-03-05 ENCOUNTER — Encounter (HOSPITAL_BASED_OUTPATIENT_CLINIC_OR_DEPARTMENT_OTHER): Payer: Self-pay | Admitting: Orthopedic Surgery

## 2023-03-05 ENCOUNTER — Other Ambulatory Visit: Payer: Self-pay

## 2023-03-05 ENCOUNTER — Encounter (HOSPITAL_BASED_OUTPATIENT_CLINIC_OR_DEPARTMENT_OTHER)
Admission: RE | Disposition: A | Discharge status: Home / Self Care | Payer: Self-pay | Source: Home / Self Care | Attending: Orthopedic Surgery

## 2023-03-05 ENCOUNTER — Ambulatory Visit (HOSPITAL_BASED_OUTPATIENT_CLINIC_OR_DEPARTMENT_OTHER): Payer: Self-pay | Admitting: Anesthesiology

## 2023-03-05 ENCOUNTER — Ambulatory Visit (HOSPITAL_BASED_OUTPATIENT_CLINIC_OR_DEPARTMENT_OTHER): Payer: Medicare PPO | Admitting: Anesthesiology

## 2023-03-05 DIAGNOSIS — S83241A Other tear of medial meniscus, current injury, right knee, initial encounter: Secondary | ICD-10-CM

## 2023-03-05 DIAGNOSIS — Z955 Presence of coronary angioplasty implant and graft: Secondary | ICD-10-CM | POA: Insufficient documentation

## 2023-03-05 DIAGNOSIS — I2510 Atherosclerotic heart disease of native coronary artery without angina pectoris: Secondary | ICD-10-CM | POA: Insufficient documentation

## 2023-03-05 DIAGNOSIS — M1711 Unilateral primary osteoarthritis, right knee: Secondary | ICD-10-CM | POA: Insufficient documentation

## 2023-03-05 DIAGNOSIS — X58XXXA Exposure to other specified factors, initial encounter: Secondary | ICD-10-CM | POA: Diagnosis not present

## 2023-03-05 DIAGNOSIS — Z87891 Personal history of nicotine dependence: Secondary | ICD-10-CM | POA: Insufficient documentation

## 2023-03-05 DIAGNOSIS — I252 Old myocardial infarction: Secondary | ICD-10-CM | POA: Diagnosis not present

## 2023-03-05 DIAGNOSIS — I10 Essential (primary) hypertension: Secondary | ICD-10-CM | POA: Insufficient documentation

## 2023-03-05 DIAGNOSIS — M1990 Unspecified osteoarthritis, unspecified site: Secondary | ICD-10-CM | POA: Diagnosis not present

## 2023-03-05 DIAGNOSIS — Z01818 Encounter for other preprocedural examination: Secondary | ICD-10-CM

## 2023-03-05 HISTORY — DX: Atherosclerotic heart disease of native coronary artery without angina pectoris: I25.10

## 2023-03-05 HISTORY — PX: KNEE ARTHROSCOPY WITH MEDIAL MENISECTOMY: SHX5651

## 2023-03-05 HISTORY — DX: Bradycardia, unspecified: R00.1

## 2023-03-05 HISTORY — PX: CHONDROPLASTY: SHX5177

## 2023-03-05 HISTORY — DX: Disease of spinal cord, unspecified: G95.9

## 2023-03-05 SURGERY — ARTHROSCOPY, KNEE, WITH MEDIAL MENISCECTOMY
Anesthesia: General | Site: Knee | Laterality: Right

## 2023-03-05 MED ORDER — PROPOFOL 10 MG/ML IV BOLUS
INTRAVENOUS | Status: DC | PRN
  Administered 2023-03-05: 200 mg via INTRAVENOUS

## 2023-03-05 MED ORDER — LIDOCAINE 2% (20 MG/ML) 5 ML SYRINGE
INTRAMUSCULAR | Status: AC
  Filled 2023-03-05: qty 5

## 2023-03-05 MED ORDER — BUPIVACAINE HCL (PF) 0.5 % IJ SOLN
INTRAMUSCULAR | Status: AC
  Filled 2023-03-05: qty 30

## 2023-03-05 MED ORDER — ACETAMINOPHEN 160 MG/5ML PO SOLN: 325.0000 mg | ORAL | Status: DC | PRN

## 2023-03-05 MED ORDER — ONDANSETRON HCL 4 MG/2ML IJ SOLN: 4.0000 mg | Freq: Once | INTRAMUSCULAR | Status: DC | PRN

## 2023-03-05 MED ORDER — TRAMADOL HCL 50 MG PO TABS: 50.0000 mg | ORAL_TABLET | Freq: Four times a day (QID) | ORAL | 0 refills | Status: AC | PRN

## 2023-03-05 MED ORDER — BUPIVACAINE-EPINEPHRINE (PF) 0.25% -1:200000 IJ SOLN
INTRAMUSCULAR | Status: AC
  Filled 2023-03-05: qty 30

## 2023-03-05 MED ORDER — FENTANYL CITRATE (PF) 100 MCG/2ML IJ SOLN
INTRAMUSCULAR | Status: DC | PRN
  Administered 2023-03-05 (×2): 50 ug via INTRAVENOUS

## 2023-03-05 MED ORDER — LIDOCAINE-EPINEPHRINE (PF) 1 %-1:200000 IJ SOLN
INTRAMUSCULAR | Status: AC
  Filled 2023-03-05: qty 60

## 2023-03-05 MED ORDER — MEPERIDINE HCL 25 MG/ML IJ SOLN
6.2500 mg | INTRAMUSCULAR | Status: DC | PRN
Start: 2023-03-05 — End: 2023-03-05

## 2023-03-05 MED ORDER — HYDROMORPHONE HCL 1 MG/ML IJ SOLN
INTRAMUSCULAR | Status: AC
  Filled 2023-03-05: qty 0.5

## 2023-03-05 MED ORDER — DEXAMETHASONE SODIUM PHOSPHATE 4 MG/ML IJ SOLN
INTRAMUSCULAR | Status: DC | PRN
  Administered 2023-03-05: 5 mg via INTRAVENOUS

## 2023-03-05 MED ORDER — ACETAMINOPHEN 10 MG/ML IV SOLN
1000.0000 mg | Freq: Once | INTRAVENOUS | Status: DC | PRN
  Administered 2023-03-05: 1000 mg via INTRAVENOUS

## 2023-03-05 MED ORDER — LACTATED RINGERS IV SOLN: INTRAVENOUS | Status: DC | PRN

## 2023-03-05 MED ORDER — MIDAZOLAM HCL 2 MG/2ML IJ SOLN
INTRAMUSCULAR | Status: AC
  Filled 2023-03-05: qty 2

## 2023-03-05 MED ORDER — ONDANSETRON HCL 4 MG/2ML IJ SOLN
INTRAMUSCULAR | Status: AC
  Filled 2023-03-05: qty 2

## 2023-03-05 MED ORDER — PROPOFOL 10 MG/ML IV BOLUS
INTRAVENOUS | Status: AC
  Filled 2023-03-05: qty 20

## 2023-03-05 MED ORDER — EPHEDRINE 5 MG/ML INJ
INTRAVENOUS | Status: AC
  Filled 2023-03-05: qty 5

## 2023-03-05 MED ORDER — MIDAZOLAM HCL 5 MG/5ML IJ SOLN
INTRAMUSCULAR | Status: DC | PRN
  Administered 2023-03-05: 1 mg via INTRAVENOUS

## 2023-03-05 MED ORDER — LIDOCAINE HCL (PF) 1 % IJ SOLN
INTRAMUSCULAR | Status: AC
  Filled 2023-03-05: qty 60

## 2023-03-05 MED ORDER — DEXAMETHASONE SODIUM PHOSPHATE 10 MG/ML IJ SOLN
INTRAMUSCULAR | Status: AC
  Filled 2023-03-05: qty 1

## 2023-03-05 MED ORDER — BUPIVACAINE HCL (PF) 0.25 % IJ SOLN
INTRAMUSCULAR | Status: DC | PRN
  Administered 2023-03-05: 20 mL via INTRA_ARTICULAR

## 2023-03-05 MED ORDER — LIDOCAINE HCL (CARDIAC) PF 100 MG/5ML IV SOSY
PREFILLED_SYRINGE | INTRAVENOUS | Status: DC | PRN
  Administered 2023-03-05: 50 mg via INTRAVENOUS

## 2023-03-05 MED ORDER — FENTANYL CITRATE (PF) 100 MCG/2ML IJ SOLN
INTRAMUSCULAR | Status: AC
  Filled 2023-03-05: qty 2

## 2023-03-05 MED ORDER — HYDROMORPHONE HCL 1 MG/ML IJ SOLN
0.2500 mg | INTRAMUSCULAR | Status: DC | PRN
Start: 2023-03-05 — End: 2023-03-05
  Administered 2023-03-05 (×3): 0.5 mg via INTRAVENOUS

## 2023-03-05 MED ORDER — BUPIVACAINE-EPINEPHRINE (PF) 0.5% -1:200000 IJ SOLN
INTRAMUSCULAR | Status: AC
  Filled 2023-03-05: qty 30

## 2023-03-05 MED ORDER — ACETAMINOPHEN 10 MG/ML IV SOLN
INTRAVENOUS | Status: AC
  Filled 2023-03-05: qty 100

## 2023-03-05 MED ORDER — OXYCODONE HCL 5 MG/5ML PO SOLN: 5.0000 mg | Freq: Once | ORAL | Status: AC | PRN

## 2023-03-05 MED ORDER — CEFAZOLIN SODIUM-DEXTROSE 2-4 GM/100ML-% IV SOLN
2.0000 g | INTRAVENOUS | Status: AC
  Administered 2023-03-05: 2 g via INTRAVENOUS

## 2023-03-05 MED ORDER — OXYCODONE HCL 5 MG PO TABS
ORAL_TABLET | ORAL | Status: AC
  Filled 2023-03-05: qty 1

## 2023-03-05 MED ORDER — ACETAMINOPHEN 325 MG PO TABS: 325.0000 mg | ORAL_TABLET | ORAL | Status: DC | PRN

## 2023-03-05 MED ORDER — BUPIVACAINE HCL (PF) 0.25 % IJ SOLN
INTRAMUSCULAR | Status: AC
  Filled 2023-03-05: qty 60

## 2023-03-05 MED ORDER — OXYCODONE HCL 5 MG PO TABS
5.0000 mg | ORAL_TABLET | Freq: Once | ORAL | Status: AC | PRN
  Administered 2023-03-05: 5 mg via ORAL

## 2023-03-05 MED ORDER — ONDANSETRON HCL 4 MG/2ML IJ SOLN
INTRAMUSCULAR | Status: DC | PRN
  Administered 2023-03-05: 4 mg via INTRAVENOUS

## 2023-03-05 MED ORDER — SODIUM CHLORIDE 0.9 % IR SOLN
Status: DC | PRN
  Administered 2023-03-05: 6000 mL

## 2023-03-05 MED ORDER — CEFAZOLIN SODIUM-DEXTROSE 2-4 GM/100ML-% IV SOLN
INTRAVENOUS | Status: AC
  Filled 2023-03-05: qty 100

## 2023-03-05 MED ORDER — EPHEDRINE SULFATE (PRESSORS) 50 MG/ML IJ SOLN
INTRAMUSCULAR | Status: DC | PRN
  Administered 2023-03-05: 15 mg via INTRAVENOUS
  Administered 2023-03-05: 10 mg via INTRAVENOUS

## 2023-03-05 SURGICAL SUPPLY — 54 items
APL PRP STRL LF DISP 70% ISPRP (MISCELLANEOUS) ×1
BANDAGE ESMARK 6X9 LF (GAUZE/BANDAGES/DRESSINGS) IMPLANT
BLADE SHAVER BONE 5.0X13 (MISCELLANEOUS) ×1 IMPLANT
BNDG CMPR 5X4 CHSV STRCH STRL (GAUZE/BANDAGES/DRESSINGS)
BNDG CMPR 6 X 5 YARDS HK CLSR (GAUZE/BANDAGES/DRESSINGS) ×1
BNDG CMPR 9X6 STRL LF SNTH (GAUZE/BANDAGES/DRESSINGS) ×1
BNDG COHESIVE 4X5 TAN STRL LF (GAUZE/BANDAGES/DRESSINGS) IMPLANT
BNDG ELASTIC 6INX 5YD STR LF (GAUZE/BANDAGES/DRESSINGS) ×1 IMPLANT
BNDG ESMARK 6X9 LF (GAUZE/BANDAGES/DRESSINGS) ×1
CHLORAPREP W/TINT 26 (MISCELLANEOUS) ×1 IMPLANT
CLSR STERI-STRIP ANTIMIC 1/2X4 (GAUZE/BANDAGES/DRESSINGS) IMPLANT
COOLER ICEMAN CLASSIC (MISCELLANEOUS) ×1 IMPLANT
CUFF TOURN SGL QUICK 34 (TOURNIQUET CUFF) ×1
CUFF TRNQT CYL 34X4.125X (TOURNIQUET CUFF) IMPLANT
CUTTER TENSIONER SUT 2-0 0 FBW (INSTRUMENTS) IMPLANT
DISSECTOR 4.0MM X 13CM (MISCELLANEOUS) ×1 IMPLANT
DRAPE IMP U-DRAPE 54X76 (DRAPES) IMPLANT
DRAPE U-SHAPE 47X51 STRL (DRAPES) ×1 IMPLANT
DRAPE-T ARTHROSCOPY W/POUCH (DRAPES) ×1 IMPLANT
DRSG EMULSION OIL 3X3 NADH (GAUZE/BANDAGES/DRESSINGS) IMPLANT
GAUZE SPONGE 4X4 12PLY STRL (GAUZE/BANDAGES/DRESSINGS) ×2 IMPLANT
GLOVE BIO SURGEON STRL SZ7.5 (GLOVE) ×1 IMPLANT
GLOVE BIO SURGEON STRL SZ8 (GLOVE) ×1 IMPLANT
GLOVE BIOGEL PI IND STRL 7.0 (GLOVE) IMPLANT
GLOVE BIOGEL PI IND STRL 7.5 (GLOVE) IMPLANT
GLOVE BIOGEL PI IND STRL 8 (GLOVE) ×1 IMPLANT
GLOVE SURG SS PI 7.0 STRL IVOR (GLOVE) IMPLANT
GOWN STRL REUS W/ TWL LRG LVL3 (GOWN DISPOSABLE) ×1 IMPLANT
GOWN STRL REUS W/ TWL XL LVL3 (GOWN DISPOSABLE) IMPLANT
GOWN STRL REUS W/TWL LRG LVL3 (GOWN DISPOSABLE)
GOWN STRL REUS W/TWL XL LVL3 (GOWN DISPOSABLE) ×3 IMPLANT
IMMOBILIZER KNEE 20 (SOFTGOODS)
IMMOBILIZER KNEE 20 THIGH 36 (SOFTGOODS) IMPLANT
IMMOBILIZER KNEE 22 UNIV (SOFTGOODS) IMPLANT
IV NS IRRIG 3000ML ARTHROMATIC (IV SOLUTION) ×4 IMPLANT
KIT TRANSTIBIAL (DISPOSABLE) ×1 IMPLANT
NDL SUT 2-0 SCORPION KNEE (NEEDLE) IMPLANT
NEEDLE SUT 2-0 SCORPION KNEE (NEEDLE)
NS IRRIG 1000ML POUR BTL (IV SOLUTION) ×1 IMPLANT
PACK ARTHROSCOPY DSU (CUSTOM PROCEDURE TRAY) ×1 IMPLANT
PACK BASIN DAY SURGERY FS (CUSTOM PROCEDURE TRAY) IMPLANT
PAD CAST 4YDX4 CTTN HI CHSV (CAST SUPPLIES) ×1 IMPLANT
PAD COLD SHLDR WRAP-ON (PAD) ×1 IMPLANT
PADDING CAST COTTON 4X4 STRL (CAST SUPPLIES)
PORTAL SKID DEVICE (INSTRUMENTS) IMPLANT
SLEEVE SCD COMPRESS KNEE MED (STOCKING) ×1 IMPLANT
SPIKE FLUID TRANSFER (MISCELLANEOUS) IMPLANT
SUT ETHILON 3 0 PS 1 (SUTURE) IMPLANT
SUT MNCRL AB 4-0 PS2 18 (SUTURE) ×1 IMPLANT
TOWEL GREEN STERILE FF (TOWEL DISPOSABLE) ×1 IMPLANT
TUBE CONNECTING 20X1/4 (TUBING) IMPLANT
TUBING ARTHROSCOPY IRRIG 16FT (MISCELLANEOUS) ×1 IMPLANT
WAND ABLATOR APOLLO I90 (BUR) IMPLANT
WRAP KNEE MAXI GEL POST OP (GAUZE/BANDAGES/DRESSINGS) IMPLANT

## 2023-03-05 NOTE — Op Note (Signed)
.  Orthopaedic Surgery Operative Note (CSN: [id number])  [name]  [birth year] Date of Surgery: 03/05/2023   Diagnoses:  RIGHT KNEE MEDIAL MENISCUS TEAR AND OSTEOARTHRITIS  Procedure: Right knee arthroscopy with partial medial menisectomy, chondroplasty   Operative Finding Exam under anesthesia: Range of motion full and symmetric to opposite knee, ligamentously stable exam with normal Lachman  Suprapatellar pouch: No loose bodies, mild synovitis  Patella-femoral compartment: Grade 4 chondral changes of medial patellar facet. Grade 1 changes lateral patellar facet. Grade 2-3 chondral changes of trochlea  Medial compartment: Grade 4 chondral changes of posterior medial femoral condyle with loose chondral flaps. Grade 1 Chondral changes of tibia plateau. Partial posterior medial meniscal root tear  Lateral Compartment: Grade 1 chondral changes of lateral femoral condyle and tibia plateau. Meniscus intact  Intercondylar Notch: ACL and PCL intact   Post-operative plan: The patient will be WBAT.  The patient will be discharged home.  DVT prophylaxis Aspirin 81 mg twice daily for 6 weeks.  Pain control with PRN pain medication preferring oral medicines.  Follow up plan will be scheduled in approximately 7 days for incision check.  Post-Op Diagnosis: Same Surgeons:Primary: [name], MD Location: [facility] OR ROOM 6 Anesthesia: General with local Antibiotics: Ancef 2 g Tourniquet time:  Total Tourniquet Time Documented: Thigh (Right) - 32 minutes Total: Thigh (Right) - 32 minutes  Estimated Blood Loss: 10cc Complications: None Specimens: None Implants: * No implants in log *  Indications for Surgery:   Kevin Wade is a 72 y.o. male with meniscus tear and mechanical symptoms.  Benefits and risks of operative and nonoperative management were discussed prior to surgery with patient/guardian(s) and informed consent form was completed.  Specific risks including infection, need  for additional surgery, postmeniscectomy syndrome, continued arthrosis and pain amongst others.   Procedure:   The patient was identified properly. Informed consent was obtained and the surgical site was marked. The patient was taken up to suite where general anesthesia was induced. The patient was placed in the supine position with a post against the surgical leg and a nonsterile tourniquet applied. The surgical leg was then prepped and draped usual sterile fashion.  A standard surgical timeout was performed.  2 standard anterior portals were made and diagnostic arthroscopy performed. Please note the findings as noted above.  We used a shaver to perform a synovectomy of the anterior medial and anterolateral compartments as well as overgrowth along the patella.  We performed a partial medial meniscectomy using a shaver and basket back to a stable base removing all loose fragments.The meniscus was sealed using the electrocautery. A gentle chondroplasty was performed to remove any loose chondral flaps with a shaver and electrocautery.  Incisions closed with nylon suture. The patient was awoken from general anesthesia and taken to the PACU in stable condition without complication.   [name] 03/05/2023, 9:36 AM Orthopaedic Surgery

## 2023-03-05 NOTE — Brief Op Note (Signed)
03/05/2023  9:23 AM  PATIENT:  Kevin Wade  72 y.o. male  PRE-OPERATIVE DIAGNOSIS:  RIGHT KNEE MEDIAL MENISCUS TEAR AND OSTEOARTHRITIS  POST-OPERATIVE DIAGNOSIS:  RIGHT KNEE MEDIAL MENISCUS TEAR AND OSTEOARTHRITIS  PROCEDURE:  Procedure(s): RIGHT KNEE ARTHROSCOPY, PARTIAL MEDIAL MENISECTOMY (Right) CHONDROPLASTY (Right)  SURGEON:  Surgeons and Role:    * [name], MD - Primary  ASSISTANTS: none   ANESTHESIA:   general  EBL:  10 mL   BLOOD ADMINISTERED:none  DRAINS: none   LOCAL MEDICATIONS USED:  MARCAINE     SPECIMEN:  No Specimen  DISPOSITION OF SPECIMEN:  N/A  COUNTS:  YES  TOURNIQUET:   Total Tourniquet Time Documented: Thigh (Right) - 32 minutes Total: Thigh (Right) - 32 minutes   DICTATION: .Note written in EPIC  PLAN OF CARE: Discharge to home after PACU  PATIENT DISPOSITION:  PACU - hemodynamically stable.   Delay start of Pharmacological VTE agent (>24hrs) due to surgical blood loss or risk of bleeding: not applicable

## 2023-03-05 NOTE — Transfer of Care (Signed)
Immediate Anesthesia Transfer of Care Note  Patient: Kevin Wade  Procedure(s) Performed: RIGHT KNEE ARTHROSCOPY, PARTIAL MEDIAL MENISECTOMY (Right: Knee) CHONDROPLASTY (Right: Knee)  Patient Location: PACU  Anesthesia Type:General  Level of Consciousness: awake, alert , and oriented  Airway & Oxygen Therapy: Patient Spontanous Breathing and Patient connected to face mask oxygen  Post-op Assessment: Report given to RN and Post -op Vital signs reviewed and stable  Post vital signs: Reviewed and stable  Last Vitals:  Vitals Value Taken Time  BP 114/62 03/05/23 0920  Temp 36.2 C 03/05/23 0920  Pulse 53 03/05/23 0923  Resp 9 03/05/23 0923  SpO2 100 % 03/05/23 0923  Vitals shown include unfiled device data.  Last Pain:  Vitals:   03/05/23 0920  TempSrc:   PainSc: 0-No pain         Complications: No notable events documented.

## 2023-03-05 NOTE — H&P (Signed)
Kevin Wade is an 72 y.o. male.   Chief Complaint: Right knee pain HPI: 72 year old male with right knee pain. He failed conservative treatment with medications, physical therapy and CSI. Imaging concerning for arthritis and possible medial meniscus tear.   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 with removal and replacement of hardware C3-C5 ACDF on 11/27/2022   Hyperlipidemia    Hyperlipidemia    Hypertension    NSTEMI (non-ST elevated myocardial infarction) (HCC) 11/19/2017    Past Surgical History:  Procedure Laterality Date   ANTERIOR FUSION CERVICAL SPINE  11/12/2022   APPENDECTOMY     CORONARY STENT INTERVENTION N/A 11/13/2017   Procedure: CORONARY STENT INTERVENTION;  Surgeon: Tonny Bollman, MD;  Location: [facility] INVASIVE CV LAB;  Service: Cardiovascular;  Laterality: N/A;   LEFT HEART CATH AND CORONARY ANGIOGRAPHY N/A 11/13/2017   Procedure: LEFT HEART CATH AND CORONARY ANGIOGRAPHY;  Surgeon: Tonny Bollman, MD;  Location: [facility] INVASIVE CV LAB;  Service: Cardiovascular;  Laterality: N/A;   LEFT HEART CATH AND CORONARY ANGIOGRAPHY N/A 04/21/2020   Procedure: LEFT HEART CATH AND CORONARY ANGIOGRAPHY;  Surgeon: Lennette Bihari, MD;  Location: [facility] INVASIVE CV LAB;  Service: Cardiovascular;  Laterality: N/A;   ROTATOR CUFF REPAIR Right    Dr. Ave Filter    Family History  Problem Relation Age of Onset   Hypertension Father    Colon cancer Neg Hx    Rectal cancer Neg Hx    Stomach cancer Neg Hx    Esophageal cancer Neg Hx    Inflammatory bowel disease Neg Hx    Liver disease Neg Hx    Pancreatic cancer Neg Hx    Social History:  reports that he has quit smoking. His smoking use included  cigarettes. He has never used smokeless tobacco. He reports current alcohol use. He reports that he does not use drugs.  Allergies: No Known Allergies  Medications Prior to Admission  Medication Sig Dispense Refill   amLODipine (NORVASC) 5 MG tablet Take 1 tablet (5 mg total) by mouth at bedtime.     Ascorbic Acid (VITAMIN C ADULT GUMMIES PO) Take 1 capsule by mouth daily in the afternoon.     aspirin EC 81 MG tablet Take 81 mg by mouth daily.     ezetimibe (ZETIA) 10 MG tablet TAKE 1 TABLET BY MOUTH DAILY **NEEDS TO KEEP APPOINTMENT FOR FUTURE REFILLS** 90 tablet 0   meloxicam (MOBIC) 15 MG tablet Take 15 mg by mouth daily.     Multiple Vitamins-Minerals (ONE-A-DAY MENS 50+ ADVANTAGE) TABS Take 1 tablet by mouth daily.     Multiple Vitamins-Minerals (ZINC PO) Take 1 capsule by mouth daily in the afternoon.     ramipril (ALTACE) 10 MG capsule TAKE 1 CAPSULE BY MOUTH EVERY DAY 90 capsule 0   rosuvastatin (CRESTOR) 40 MG tablet Take 40 mg by mouth daily.     nitroGLYCERIN (NITROSTAT) 0.4 MG SL tablet Place 1 tablet (0.4 mg total) under the tongue every 5 (five) minutes x 3 doses as needed for chest pain. 20 tablet 0    No results found for this or any previous  visit (from the past 48 hour(s)). No results found.  Review of Systems  HENT: Negative.    Respiratory: Negative.    Cardiovascular: Negative.   Gastrointestinal: Negative.   Musculoskeletal:  Positive for arthralgias and gait problem.  Psychiatric/Behavioral: Negative.      Blood pressure (!) 145/60, pulse (!) 47, temperature 98.6 F (37 C), temperature source Oral, resp. rate 14, height 5\' 11"  (1.803 m), weight 79.4 kg, SpO2 100%. Physical Exam Constitutional:      Appearance: Normal appearance.  HENT:     Head: Normocephalic and atraumatic.     Nose: Nose normal.  Cardiovascular:     Rate and Rhythm: Normal rate and regular rhythm.     Pulses: Normal pulses.     Heart sounds: Normal heart sounds.  Pulmonary:      Effort: Pulmonary effort is normal.     Breath sounds: Normal breath sounds.  Abdominal:     General: Abdomen is flat.     Palpations: Abdomen is soft.  Musculoskeletal:     Comments: Right knee Small effusion TTP medial joint line + McMurrays ROM 5-120 NVI  Neurological:     Mental Status: He is alert.      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

## 2023-03-05 NOTE — Anesthesia Procedure Notes (Signed)
Procedure Name: LMA Insertion Date/Time: 03/05/2023 8:27 AM  Performed by: Cleda Clarks, CRNAPre-anesthesia Checklist: Patient identified, Emergency Drugs available, Suction available and Patient being monitored Patient Re-evaluated:Patient Re-evaluated prior to induction Oxygen Delivery Method: Circle system utilized Preoxygenation: Pre-oxygenation with 100% oxygen Induction Type: IV induction Ventilation: Mask ventilation without difficulty LMA: LMA inserted LMA Size: 5.0 Number of attempts: 1 Placement Confirmation: positive ETCO2 Tube secured with: Tape Dental Injury: Teeth and Oropharynx as per pre-operative assessment

## 2023-03-05 NOTE — Discharge Instructions (Addendum)
Post Anesthesia Home Care Instructions  Activity: Get plenty of rest for the remainder of the day. A responsible individual must stay with you for 24 hours following the procedure.  For the next 24 hours, DO NOT: -Drive a car -Advertising copywriter -Drink alcoholic beverages -Take any medication unless instructed by your physician -Make any legal decisions or sign important papers.  Meals: Start with liquid foods such as gelatin or soup. Progress to regular foods as tolerated. Avoid greasy, spicy, heavy foods. If nausea and/or vomiting occur, drink only clear liquids until the nausea and/or vomiting subsides. Call your physician if vomiting continues.  Special Instructions/Symptoms: Your throat may feel dry or sore from the anesthesia or the breathing tube placed in your throat during surgery. If this causes discomfort, gargle with warm salt water. The discomfort should disappear within 24 hours.  If you had a scopolamine patch placed behind your ear for the management of post- operative nausea and/or vomiting:  1. The medication in the patch is effective for 72 hours, after which it should be removed.  Wrap patch in a tissue and discard in the trash. Wash hands thoroughly with soap and water. 2. You may remove the patch earlier than 72 hours if you experience unpleasant side effects which may include dry mouth, dizziness or visual disturbances. 3. Avoid touching the patch. Wash your hands with soap and water after contact with the patch.       Kevin Mule, MD [facility] Orthopaedic and Sports Medicine [facility] [address] [place], [place] [address] [phone]   POST-OPERATIVE INSTRUCTIONS - Knee Arthroscopy  WOUND CARE - You may remove the Operative Dressing on Post-Op Day #3 (72hrs after surgery).   - An ACE wrap may be used to control swelling, do not wrap this too tight.  If the initial ACE wrap feels too tight you may loosen it. - There may be a small amount of fluid/bleeding  leaking at the surgical site.  - This is normal; the knee is filled with fluid during the procedure and can leak for 24-48hrs after surgery. You may change/reinforce the bandage as needed.  - Use the Cryocuff or Ice as often as possible for the first 7 days, then as needed for pain relief. Always keep a towel, ACE wrap or other barrier between the cooling unit and your skin.  - You may shower on Post-Op Day #3. Gently pat the area dry.  - Do not soak the knee in water or submerge it.  - Do not go swimming in the pool or ocean until 4 weeks after surgery or when otherwise instructed.  Keep dry incisions as dry as possible.   BRACE/AMBULATION  -           You will not need a brace after this procedure.   - You may use crutches initially to help you weight bear, but this is not required - You can put full weight on your operative leg as you feel comfortable  PHYSICAL THERAPY - You will begin physical therapy soon after surgery (unless otherwise specified) - Please call to set up an appointment, if you do not already have one  - Let our office if there are any issues with scheduling your therapy    POST-OP MEDICATIONS- Multimodal approach to pain control In general your pain will be controlled with a combination of substances.  Prescriptions unless otherwise discussed are electronically sent to your pharmacy.  This is a carefully made plan we use to minimize narcotic use.  Meloxicam OR Celebrex - Anti-inflammatory medication taken on a scheduled basis Acetaminophen - Non-narcotic pain medicine taken on a scheduled basis  Tramadol - This is a strong narcotic, to be used only on an "as needed" basis for SEVERE pain. Aspirin 81mg  - This medicine is used to minimize the risk of blood clots after surgery. Omeprazole - daily medicine to protect your stomach while taking anti-inflammatories.    FOLLOW-UP   Please call the office to schedule a follow-up appointment for your incision check, 14  days post-operatively.   IF YOU HAVE ANY QUESTIONS, PLEASE FEEL FREE TO CALL OUR OFFICE.   HELPFUL INFORMATION   Keep your leg elevated to decrease swelling, which will then in turn decrease your pain. I would elevate the foot of your bed by putting a couple of couch pillows between your mattress and box spring. I would not keep pillow directly under your ankle.  - Do not sleep with a pillow behind your knee even if it is more comfortable as this may make it harder to get your knee fully straight long term.   There will be MORE swelling on days 1-3 than there is on the day of surgery.  This also is normal. The swelling will decrease with the anti-inflammatory medication, ice and keeping it elevated. The swelling will make it more difficult to bend your knee. As the swelling goes down your motion will become easier   You may develop swelling and bruising that extends from your knee down to your calf and perhaps even to your foot over the next week. Do not be alarmed. This too is normal, and it is due to gravity   There may be some numbness adjacent to the incision site. This may last for 6-12 months or longer in some patients and is expected.   You may return to sedentary work/school in the next couple of days when you feel up to it. You will need to keep your leg elevated as much as possible    You should wean off your narcotic medicines as soon as you are able.  Most patients will be off narcotics before their first postop appointment.    We suggest you use the pain medication the first night prior to going to bed, in order to ease any pain when the anesthesia wears off. You should avoid taking pain medications on an empty stomach as it will make you nauseous.   Do not drink alcoholic beverages or take illicit drugs when taking pain medications.   It is against the law to drive while taking narcotics. You cannot drive if your Right leg is in brace locked in extension.   Pain  medication may make you constipated.  Below are a few solutions to try in this order:  o Decrease the amount of pain medication if you aren't having pain.  o Drink lots of decaffeinated fluids.  o Drink prune juice and/or eat dried prunes   o If the first 3 don't work start with additional solutions  o Take Colace - an over-the-counter stool softener  o Take Senokot - an over-the-counter laxative  o Take Miralax - a stronger over-the-counter laxative

## 2023-03-05 NOTE — Anesthesia Postprocedure Evaluation (Signed)
Anesthesia Post Note  Patient: Destin Jonasson  Procedure(s) Performed: RIGHT KNEE ARTHROSCOPY, PARTIAL MEDIAL MENISECTOMY (Right: Knee) CHONDROPLASTY (Right: Knee)     Patient location during evaluation: PACU Anesthesia Type: General Level of consciousness: awake and sedated Pain management: pain level controlled Vital Signs Assessment: post-procedure vital signs reviewed and stable Respiratory status: spontaneous breathing Cardiovascular status: stable Postop Assessment: no apparent nausea or vomiting Anesthetic complications: no  No notable events documented.  Last Vitals:  Vitals:   03/05/23 1018 03/05/23 1028  BP: 126/70 130/69  Pulse: (!) 56 (!) 53  Resp: 12 16  Temp:  (!) 36.2 C  SpO2: 98% 98%    Last Pain:  Vitals:   03/05/23 1028  TempSrc:   PainSc: 7                  John F [address]

## 2023-03-06 ENCOUNTER — Encounter (HOSPITAL_BASED_OUTPATIENT_CLINIC_OR_DEPARTMENT_OTHER): Payer: Self-pay | Admitting: Orthopedic Surgery

## 2023-03-15 DIAGNOSIS — M6281 Muscle weakness (generalized): Secondary | ICD-10-CM | POA: Diagnosis not present

## 2023-03-15 DIAGNOSIS — M25661 Stiffness of right knee, not elsewhere classified: Secondary | ICD-10-CM | POA: Diagnosis not present

## 2023-03-18 DIAGNOSIS — M6281 Muscle weakness (generalized): Secondary | ICD-10-CM | POA: Diagnosis not present

## 2023-03-18 DIAGNOSIS — R911 Solitary pulmonary nodule: Secondary | ICD-10-CM | POA: Diagnosis not present

## 2023-03-18 DIAGNOSIS — M25661 Stiffness of right knee, not elsewhere classified: Secondary | ICD-10-CM | POA: Diagnosis not present

## 2023-03-26 DIAGNOSIS — M25661 Stiffness of right knee, not elsewhere classified: Secondary | ICD-10-CM | POA: Diagnosis not present

## 2023-03-26 DIAGNOSIS — M6281 Muscle weakness (generalized): Secondary | ICD-10-CM | POA: Diagnosis not present

## 2023-04-02 DIAGNOSIS — M6281 Muscle weakness (generalized): Secondary | ICD-10-CM | POA: Diagnosis not present

## 2023-04-02 DIAGNOSIS — M25661 Stiffness of right knee, not elsewhere classified: Secondary | ICD-10-CM | POA: Diagnosis not present

## 2023-04-05 ENCOUNTER — Ambulatory Visit: Payer: Medicare PPO

## 2023-04-05 NOTE — Telephone Encounter (Signed)
Called patient to reschedule tele visit due to provider preop provider being out of office. Pt states that he cancelled his surgery and is not planning to have it any time soon. Pt was informed to call our office if it anything changes.

## 2023-04-08 DIAGNOSIS — M25661 Stiffness of right knee, not elsewhere classified: Secondary | ICD-10-CM | POA: Diagnosis not present

## 2023-04-08 DIAGNOSIS — M6281 Muscle weakness (generalized): Secondary | ICD-10-CM | POA: Diagnosis not present

## 2023-04-10 ENCOUNTER — Other Ambulatory Visit (HOSPITAL_COMMUNITY): Payer: Self-pay

## 2023-04-10 ENCOUNTER — Other Ambulatory Visit: Payer: Self-pay

## 2023-04-10 MED ORDER — EZETIMIBE 10 MG PO TABS
10.0000 mg | ORAL_TABLET | Freq: Every day | ORAL | 1 refills | Status: DC
  Filled 2023-04-10: qty 90, 90d supply, fill #0

## 2023-04-16 ENCOUNTER — Other Ambulatory Visit: Payer: Self-pay

## 2023-04-22 ENCOUNTER — Other Ambulatory Visit (HOSPITAL_COMMUNITY): Payer: Self-pay

## 2023-04-24 ENCOUNTER — Other Ambulatory Visit: Payer: Self-pay

## 2023-04-24 MED ORDER — EZETIMIBE 10 MG PO TABS: 10.0000 mg | ORAL_TABLET | Freq: Every day | ORAL | 1 refills | Status: DC

## 2023-04-25 DIAGNOSIS — M25561 Pain in right knee: Secondary | ICD-10-CM | POA: Diagnosis not present

## 2023-06-06 DIAGNOSIS — M25461 Effusion, right knee: Secondary | ICD-10-CM | POA: Diagnosis not present

## 2023-06-06 DIAGNOSIS — M1711 Unilateral primary osteoarthritis, right knee: Secondary | ICD-10-CM | POA: Diagnosis not present

## 2023-06-06 DIAGNOSIS — M25561 Pain in right knee: Secondary | ICD-10-CM | POA: Diagnosis not present

## 2023-07-03 ENCOUNTER — Ambulatory Visit (INDEPENDENT_AMBULATORY_CARE_PROVIDER_SITE_OTHER): Payer: Medicare Other | Admitting: Podiatry

## 2023-07-03 ENCOUNTER — Encounter: Payer: Self-pay | Admitting: Podiatry

## 2023-07-03 DIAGNOSIS — M5416 Radiculopathy, lumbar region: Secondary | ICD-10-CM | POA: Diagnosis not present

## 2023-07-03 MED ORDER — GABAPENTIN 100 MG PO CAPS: 100.0000 mg | ORAL_CAPSULE | Freq: Three times a day (TID) | ORAL | 3 refills | Status: AC

## 2023-07-03 NOTE — Progress Notes (Signed)
 Chief Complaint  Patient presents with   Nail Problem    Hallux left - injury to nail years ago, some of the nail fell off and as the nail grew back it became thicker and darker   Foot Pain    Patient states he has numbness and tingling in feet due to back trouble, just wonders if there is anything that can be done about it   New Patient (Initial Visit)    HPI: 73 y.o. male presenting today as a new patient for evaluation of numbness with tingling and burning sensation to the bilateral toes ongoing for several years.  He does have a history of DDD.  Cervical fusion July 2024.  Also diagnosed with a lumbar degenerative changes.  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 with removal and replacement of hardware C3-C5 ACDF on 11/27/2022   Hyperlipidemia    Hyperlipidemia    Hypertension    NSTEMI (non-ST elevated myocardial infarction) (HCC) 11/19/2017    Past Surgical History:  Procedure Laterality Date   ANTERIOR FUSION CERVICAL SPINE  11/12/2022   APPENDECTOMY     CHONDROPLASTY Right 03/05/2023   Procedure: CHONDROPLASTY;  Surgeon: [name], MD;  Location: [facility];  Service: Orthopedics;  Laterality: Right;   CORONARY STENT INTERVENTION N/A 11/13/2017   Procedure: CORONARY STENT INTERVENTION;  Surgeon: Tonny Bollman, MD;  Location: [facility] INVASIVE CV LAB;  Service: Cardiovascular;  Laterality: N/A;   KNEE ARTHROSCOPY WITH MEDIAL MENISECTOMY Right 03/05/2023   Procedure: RIGHT KNEE ARTHROSCOPY, PARTIAL MEDIAL MENISECTOMY;  Surgeon: [name], MD;  Location: [facility];  Service: Orthopedics;   Laterality: Right;   LEFT HEART CATH AND CORONARY ANGIOGRAPHY N/A 11/13/2017   Procedure: LEFT HEART CATH AND CORONARY ANGIOGRAPHY;  Surgeon: Tonny Bollman, MD;  Location: [facility] INVASIVE CV LAB;  Service: Cardiovascular;  Laterality: N/A;   LEFT HEART CATH AND CORONARY ANGIOGRAPHY N/A 04/21/2020   Procedure: LEFT HEART CATH AND CORONARY ANGIOGRAPHY;  Surgeon: Lennette Bihari, MD;  Location: [facility] INVASIVE CV LAB;  Service: Cardiovascular;  Laterality: N/A;   ROTATOR CUFF REPAIR Right    Dr. Ave Filter    No Known Allergies   Physical Exam: General: The patient is alert and oriented x3 in no acute distress.  Dermatology: Skin is warm, dry and supple bilateral lower extremities.   Vascular: Palpable pedal pulses bilaterally.  Skin is warm to touch.  Clinically no concern for vascular compromise  Neurological: Grossly intact via light touch.  Paresthesia with numbness noted most severe to the toes extending up to the level of the legs.  Musculoskeletal Exam: No pedal deformities noted   Assessment/Plan of Care: 1.  Lumbar radiculopathy bilateral lower extremities; chronic 2.  Peripheral polyneuropathy  -Patient evaluated -Pathology and etiology of lumbar radiculopathy was explained in detail with patient.  Currently they are being seen and managed with neurosurgery.  He states that he is planning to have lumbar fusion surgery in the future. -In the meantime prescription for gabapentin 100 mg 3 times daily.  If  this helps alleviate his symptoms recommend long-term medication management with PCP -Return to clinic as needed       Felecia Shelling, DPM [facility]  Dr. Felecia Shelling, DPM    [address] [place], [place] [address]                Office [phone]  Fax [phone]

## 2023-09-19 DIAGNOSIS — M7989 Other specified soft tissue disorders: Secondary | ICD-10-CM | POA: Diagnosis not present

## 2023-09-19 DIAGNOSIS — M1711 Unilateral primary osteoarthritis, right knee: Secondary | ICD-10-CM | POA: Diagnosis not present

## 2023-09-26 DIAGNOSIS — M1711 Unilateral primary osteoarthritis, right knee: Secondary | ICD-10-CM | POA: Diagnosis not present

## 2023-10-03 DIAGNOSIS — M1711 Unilateral primary osteoarthritis, right knee: Secondary | ICD-10-CM | POA: Diagnosis not present

## 2023-10-20 ENCOUNTER — Other Ambulatory Visit: Payer: Self-pay | Admitting: Cardiovascular Disease

## 2023-11-11 ENCOUNTER — Encounter: Payer: Self-pay | Admitting: Physician Assistant

## 2023-11-26 ENCOUNTER — Other Ambulatory Visit: Payer: Self-pay

## 2023-11-26 MED ORDER — EZETIMIBE 10 MG PO TABS: 10.0000 mg | ORAL_TABLET | Freq: Every day | ORAL | 0 refills | Status: DC

## 2023-12-07 ENCOUNTER — Other Ambulatory Visit: Payer: Self-pay | Admitting: Cardiovascular Disease

## 2023-12-16 DIAGNOSIS — M48061 Spinal stenosis, lumbar region without neurogenic claudication: Secondary | ICD-10-CM | POA: Diagnosis not present

## 2023-12-16 DIAGNOSIS — M4726 Other spondylosis with radiculopathy, lumbar region: Secondary | ICD-10-CM | POA: Diagnosis not present

## 2023-12-16 DIAGNOSIS — M5116 Intervertebral disc disorders with radiculopathy, lumbar region: Secondary | ICD-10-CM | POA: Diagnosis not present

## 2023-12-25 DIAGNOSIS — M5416 Radiculopathy, lumbar region: Secondary | ICD-10-CM | POA: Diagnosis not present

## 2023-12-25 DIAGNOSIS — M48062 Spinal stenosis, lumbar region with neurogenic claudication: Secondary | ICD-10-CM | POA: Diagnosis not present

## 2023-12-25 DIAGNOSIS — Z981 Arthrodesis status: Secondary | ICD-10-CM | POA: Diagnosis not present

## 2023-12-27 ENCOUNTER — Telehealth: Payer: Self-pay | Admitting: *Deleted

## 2023-12-27 NOTE — Telephone Encounter (Signed)
   Name: Kevin Wade  DOB: 01-10-1951  MRN: [id number]  Primary Cardiologist: Jerel Balding, MD  Chart reviewed as part of pre-operative protocol coverage. Because of Kevin Wade past medical history and time since last visit, he will require a follow-up in-office visit in order to better assess preoperative cardiovascular risk. Last seen by Dr.Croitoru on 08/13/2022. Surgery planned TBD  Pre-op covering staff: - Please schedule appointment and call patient to inform them. If patient already had an upcoming appointment within acceptable timeframe, please add pre-op clearance to the appointment notes so provider is aware. - Please contact requesting surgeon's office via preferred method (i.e, phone, fax) to inform them of need for appointment prior to surgery.    Kevin Satterfield, NP  12/27/2023, 1:24 PM

## 2023-12-27 NOTE — Telephone Encounter (Signed)
   Pre-operative Risk Assessment    Patient Name: Kevin Wade  DOB: Nov 20, 1950 MRN: [id number]   Date of last office visit: 08/13/22 DR. CROITORU Date of next office visit: NONE   Request for Surgical Clearance    Procedure:  LAMINECTOMY SPINE LUMBAR DISCECTOMY POSTERIOR MICRO vs OPEN L2-3  Date of Surgery:  Clearance TBD                                Surgeon:  DR. LONNI FAVORITE Surgeon's Group or Practice Name:  [facility] Phone number:  [phone] Fax number:  [phone]   Type of Clearance Requested:   - Medical  - Pharmacy:  Hold Aspirin      Type of Anesthesia:  General    Additional requests/questions:    Bonney Niels Jest   12/27/2023, 1:17 PM

## 2023-12-27 NOTE — Telephone Encounter (Signed)
 S/w the pt and his wife and they agreed to appt 01/14/24 with Orren Fabry, PAC. Pt states surgery is planned for October 10th, 2025.

## 2024-01-12 NOTE — Progress Notes (Addendum)
 Cardiology Office Note   Date:  01/14/2024  ID:  Kevin Wade, DOB Aug 24, 1950, MRN [id number] PCP: Larnell Hamilton, MD   [facility] Providers Cardiologist:  Jerel Balding, MD  History of Present Illness Kevin Wade is a 73 y.o. male with a past medical history of hypertension, hyperlipidemia, small NSTEMI on November 13, 2017 followed by placement of DES for high-grade stenosis in the mid RCA.  Also had moderate 40% stenosis in the left circumflex artery but preserved LVEF.  Was last seen April 2024 was doing well at that time.  No new episodes of chest pain at rest or with activity.  Mows his own lawn with a push mower without any challenges.  Has a home gym but has not been using it much.  At his last office visit he denied chest pain at rest and exertion, DOE, orthopnea, or PND, syncope, palpitations, intermittent claudication, lower extreme edema, unexplained weight gain, focal neurologic defects, cough, hemoptysis, or wheezing.  No trouble with palpitations.  Heart rate at rest was in the 50s.  Today, he presents for preoperative clearance.  He experienced a myocardial infarction on July 3rd, 2019, treated with a stent in the right coronary artery. He currently has no chest pain, shortness of breath, palpitations, or peripheral edema. His last lipid panel showed an LDL of 61 mg/dL.  He maintains a physically active lifestyle, walking one to two blocks and performing daily activities such as dusting, vacuuming, and sweeping without difficulty. He has not engaged in running recently but has no issues with climbing stairs.   He did have some ST elevation on EKG today but is asymptomatic.  I do think we need to go forward with a stress test to rule out any progression of his CAD.  Patient did mention that he is going on a cruise until 9/17 but will be available afterwards to schedule the stress test.  Reports no shortness of breath nor dyspnea on exertion. Reports no chest pain,  pressure, or tightness. No edema, orthopnea, PND. Reports no palpitations.   Discussed the use of AI scribe software for clinical note transcription with the patient, who gave verbal consent to proceed.    ROS: pertinent ROS in HPI  Studies Reviewed EKG Interpretation Date/Time:  Tuesday January 14 2024 14:44:56 EDT Ventricular Rate:  54 PR Interval:  168 QRS Duration:  82 QT Interval:  402 QTC Calculation: 381 R Axis:   76  Text Interpretation: Sinus bradycardia ST elevation consider anterior injury or acute infarct.  When compared with ECG of 08-May-2022 09:18, ST elevation now present in Inferior leads ST elevation now present in Anterior leads Confirmed by Lucien Blanc [phone]) on 01/14/2024 5:32:43 PM       Physical Exam VS:  BP (!) 100/50 (BP Location: Right Arm, Patient Position: Sitting, Cuff Size: Normal)   Pulse (!) 54   Ht 6' (1.829 m)   Wt 178 lb (80.7 kg)   SpO2 98%   BMI 24.14 kg/m        Wt Readings from Last 3 Encounters:  01/14/24 178 lb (80.7 kg)  03/05/23 175 lb 0.7 oz (79.4 kg)  08/13/22 182 lb 6.4 oz (82.7 kg)    GEN: Well nourished, well developed in no acute distress NECK: No JVD; No carotid bruits CARDIAC: RRR, no murmurs, rubs, gallops RESPIRATORY:  Clear to auscultation without rales, wheezing or rhonchi  ABDOMEN: Soft, non-tender, non-distended EXTREMITIES:  No edema; No deformity   ASSESSMENT AND PLAN  Pre-op Clearance  Mr. Cafaro perioperative risk of a major cardiac event is 6.6% according to the Revised Cardiac Risk Index (RCRI).  Therefore, he is at high risk for perioperative complications.   His functional capacity is good at 5.62 METs according to the Duke Activity Status Index (DASI). Recommendations: He will need a Lexiscan  Myoview  before clearance can be obtained due to EKG changes.  Antiplatelet and/or Anticoagulation Recommendations: Aspirin  can be held for 5 days prior to his surgery.  Please resume Aspirin  post operatively  when it is felt to be safe from a bleeding standpoint. He will need to be off his ASA while taking Celebrex due to bleeding risk.   Pure hypercholesterolemia Hypercholesterolemia with previously low LDL levels. No recent primary care follow-up. Dietary habits may affect cholesterol levels. - Order lipid panel to assess current cholesterol levels.  ADDENDUM: Stress test was normal and low risk. He may go forward with laminectomy spine, lumbar discectomy of L2/3      Dispo: He can follow-up in 6 months with Dr. Francyne  Signed, Orren LOISE Fabry, PA-C

## 2024-01-14 ENCOUNTER — Ambulatory Visit: Attending: Physician Assistant | Admitting: Physician Assistant

## 2024-01-14 ENCOUNTER — Encounter: Payer: Self-pay | Admitting: Physician Assistant

## 2024-01-14 VITALS — BP 100/50 | HR 54 | Ht 72.0 in | Wt 178.0 lb

## 2024-01-14 DIAGNOSIS — E7800 Pure hypercholesterolemia, unspecified: Secondary | ICD-10-CM | POA: Diagnosis not present

## 2024-01-14 DIAGNOSIS — I10 Essential (primary) hypertension: Secondary | ICD-10-CM | POA: Insufficient documentation

## 2024-01-14 DIAGNOSIS — R002 Palpitations: Secondary | ICD-10-CM | POA: Diagnosis not present

## 2024-01-14 DIAGNOSIS — I2510 Atherosclerotic heart disease of native coronary artery without angina pectoris: Secondary | ICD-10-CM | POA: Insufficient documentation

## 2024-01-14 DIAGNOSIS — N529 Male erectile dysfunction, unspecified: Secondary | ICD-10-CM | POA: Insufficient documentation

## 2024-01-14 NOTE — Patient Instructions (Addendum)
 Medication Instructions:  Your physician has recommended you make the following change in your medication:  STOP BABY ASPIRIN  WHILE TAKING CELEBREX   *If you need a refill on your cardiac medications before your next appointment, please call your pharmacy*  Lab Work: TO BE DONE IN THE NEXT COUPLE OF WEEKS: LIPID, LFTS If you have labs (blood work) drawn today and your tests are completely normal, you will receive your results only by: MyChart Message (if you have MyChart) OR A paper copy in the mail If you have any lab test that is abnormal or we need to change your treatment, we will call you to review the results.  Testing/Procedures: NONE  Follow-Up: At [facility], you and your health needs are our priority.  As part of our continuing mission to provide you with exceptional heart care, our providers are all part of one team.  This team includes your primary Cardiologist (physician) and Advanced Practice Providers or APPs (Physician Assistants and Nurse Practitioners) who all work together to provide you with the care you need, when you need it.  Your next appointment:   6 month(s)  Provider:   Jerel Balding, MD   We recommend signing up for the patient portal called MyChart.  Sign up information is provided on this After Visit Summary.  MyChart is used to connect with patients for Virtual Visits (Telemedicine).  Patients are able to view lab/test results, encounter notes, upcoming appointments, etc.  Non-urgent messages can be sent to your provider as well.   To learn more about what you can do with MyChart, go to [email or web address].

## 2024-01-15 ENCOUNTER — Ambulatory Visit: Payer: Self-pay | Admitting: Cardiovascular Disease

## 2024-01-15 ENCOUNTER — Other Ambulatory Visit: Payer: Self-pay | Admitting: Physician Assistant

## 2024-01-15 ENCOUNTER — Telehealth (HOSPITAL_COMMUNITY): Payer: Self-pay | Admitting: *Deleted

## 2024-01-15 ENCOUNTER — Telehealth: Payer: Self-pay

## 2024-01-15 DIAGNOSIS — I2510 Atherosclerotic heart disease of native coronary artery without angina pectoris: Secondary | ICD-10-CM

## 2024-01-15 NOTE — Telephone Encounter (Signed)
 Left message on voicemail per DPR in reference to upcoming appointment scheduled on 01/17/2024 at 12:30 with detailed instructions given per Myocardial Perfusion Study Information Sheet for the test. LM to arrive 15 minutes early, and that it is imperative to arrive on time for appointment to keep from having the test rescheduled. If you need to cancel or reschedule your appointment, please call the office within 24 hours of your appointment. Failure to do so may result in a cancellation of your appointment, and a $50 no show fee. Phone number given for call back for any questions.

## 2024-01-15 NOTE — Telephone Encounter (Signed)
   Pre-operative Risk Assessment    Patient Name: Kevin Wade  DOB: 12-Mar-1951 MRN: [id number]   Date of last office visit: 01/14/24 ORREN FABRY, PA-C Date of next office visit: NONE   Request for Surgical Clearance    Procedure:  LAMINECTOMY SPINE LUMBAR DISCECTOMY POSTERIOR MICRO VS OPEN L2/3  Date of Surgery:  Clearance 02/21/24                                Surgeon:  DR LONNI FAVORITE Surgeon's Group or Practice Name:  [facility] Phone number:  [phone] Fax number:  [phone]  ATTN: MISTY H/ DR LONNI DIBBLE, NEUROSURGERY   Type of Clearance Requested:   - Medical  - Pharmacy:  Hold Aspirin  PER REQUEST PATIENT MAY CONTINUE TAKING ASPIRIN  81 MG THROUGHOUT SURGERY PER SURGEONS RECOMMENDATION)   (MED LIST DOES NOT HAVE ASPIRIN  LISTED)   Type of Anesthesia:  General    Additional requests/questions:    Signed, Lucie DELENA Ku   01/15/2024, 3:07 PM

## 2024-01-17 ENCOUNTER — Ambulatory Visit (HOSPITAL_COMMUNITY)
Admission: RE | Admit: 2024-01-17 | Discharge: 2024-01-17 | Disposition: A | Discharge status: Home / Self Care | Source: Ambulatory Visit | Attending: Physician Assistant | Admitting: Physician Assistant

## 2024-01-17 DIAGNOSIS — I2510 Atherosclerotic heart disease of native coronary artery without angina pectoris: Secondary | ICD-10-CM | POA: Insufficient documentation

## 2024-01-17 LAB — MYOCARDIAL PERFUSION IMAGING
Base ST Depression (mm): 0 mm
LV dias vol: 105 mL (ref 62–150)
LV sys vol: 38 mL (ref 4.2–5.8)
Nuc Stress EF: 64 %
Peak HR: 74 {beats}/min
Rest HR: 46 {beats}/min
Rest Nuclear Isotope Dose: 9.4 mCi
SDS: 0
SRS: 0
SSS: 0
ST Depression (mm): 0 mm
Stress Nuclear Isotope Dose: 31.5 mCi
TID: 1.09

## 2024-01-17 MED ORDER — TECHNETIUM TC 99M TETROFOSMIN IV KIT
31.5000 | PACK | Freq: Once | INTRAVENOUS | Status: AC | PRN
  Administered 2024-01-17: 31.5 via INTRAVENOUS

## 2024-01-17 MED ORDER — TECHNETIUM TC 99M TETROFOSMIN IV KIT
9.4000 | PACK | Freq: Once | INTRAVENOUS | Status: AC | PRN
  Administered 2024-01-17: 9.4 via INTRAVENOUS

## 2024-01-17 MED ORDER — REGADENOSON 0.4 MG/5ML IV SOLN
0.4000 mg | Freq: Once | INTRAVENOUS | Status: AC
Start: 2024-01-17 — End: 2024-01-17
  Administered 2024-01-17: 0.4 mg via INTRAVENOUS

## 2024-01-17 MED ORDER — REGADENOSON 0.4 MG/5ML IV SOLN
INTRAVENOUS | Status: AC
  Filled 2024-01-17: qty 5

## 2024-01-20 NOTE — Telephone Encounter (Signed)
 Preoperative team,  Patient was provided with recommendations for upcoming surgery on 01/14/2024 note from [facility], [place].  Please forward office note to requesting party/office.  Thank you for your help.  Josefa HERO. Shaunette Gassner NP-C     01/20/2024, 11:54 AM [facility] [address] [place], [place] [address] Office [phone]

## 2024-01-20 NOTE — Telephone Encounter (Signed)
 I will fax notes per preop APP Josefa Beauvais, FNP pt was cleared ny Margaretann Fabry, [facility] 01/14/24.

## 2024-01-20 NOTE — Telephone Encounter (Signed)
 I will send this to preop APP to review notes needing testing sent to their office. I do not see that the pt has been cleared yet at this point.

## 2024-01-20 NOTE — Telephone Encounter (Signed)
 [facility] calling to request reports from Pts recent stress test, EKG, recent labs in addition to clearance. Please advise.

## 2024-02-06 DIAGNOSIS — I10 Essential (primary) hypertension: Secondary | ICD-10-CM | POA: Diagnosis not present

## 2024-02-06 DIAGNOSIS — I2510 Atherosclerotic heart disease of native coronary artery without angina pectoris: Secondary | ICD-10-CM | POA: Diagnosis not present

## 2024-02-06 DIAGNOSIS — Z9861 Coronary angioplasty status: Secondary | ICD-10-CM | POA: Diagnosis not present

## 2024-02-06 DIAGNOSIS — E785 Hyperlipidemia, unspecified: Secondary | ICD-10-CM | POA: Diagnosis not present

## 2024-02-10 DIAGNOSIS — R7989 Other specified abnormal findings of blood chemistry: Secondary | ICD-10-CM | POA: Diagnosis not present

## 2024-02-10 DIAGNOSIS — Z125 Encounter for screening for malignant neoplasm of prostate: Secondary | ICD-10-CM | POA: Diagnosis not present

## 2024-02-10 DIAGNOSIS — D649 Anemia, unspecified: Secondary | ICD-10-CM | POA: Diagnosis not present

## 2024-02-10 DIAGNOSIS — Z0189 Encounter for other specified special examinations: Secondary | ICD-10-CM | POA: Diagnosis not present

## 2024-02-10 DIAGNOSIS — I10 Essential (primary) hypertension: Secondary | ICD-10-CM | POA: Diagnosis not present

## 2024-02-10 DIAGNOSIS — E785 Hyperlipidemia, unspecified: Secondary | ICD-10-CM | POA: Diagnosis not present

## 2024-02-17 DIAGNOSIS — Z23 Encounter for immunization: Secondary | ICD-10-CM | POA: Diagnosis not present

## 2024-02-17 DIAGNOSIS — M519 Unspecified thoracic, thoracolumbar and lumbosacral intervertebral disc disorder: Secondary | ICD-10-CM | POA: Diagnosis not present

## 2024-02-17 DIAGNOSIS — Z1331 Encounter for screening for depression: Secondary | ICD-10-CM | POA: Diagnosis not present

## 2024-02-17 DIAGNOSIS — R82998 Other abnormal findings in urine: Secondary | ICD-10-CM | POA: Diagnosis not present

## 2024-02-17 DIAGNOSIS — M25561 Pain in right knee: Secondary | ICD-10-CM | POA: Diagnosis not present

## 2024-02-17 DIAGNOSIS — I2584 Coronary atherosclerosis due to calcified coronary lesion: Secondary | ICD-10-CM | POA: Diagnosis not present

## 2024-02-17 DIAGNOSIS — I10 Essential (primary) hypertension: Secondary | ICD-10-CM | POA: Diagnosis not present

## 2024-02-17 DIAGNOSIS — Z0000 Encounter for general adult medical examination without abnormal findings: Secondary | ICD-10-CM | POA: Diagnosis not present

## 2024-02-17 DIAGNOSIS — E785 Hyperlipidemia, unspecified: Secondary | ICD-10-CM | POA: Diagnosis not present

## 2024-02-17 DIAGNOSIS — Z1339 Encounter for screening examination for other mental health and behavioral disorders: Secondary | ICD-10-CM | POA: Diagnosis not present

## 2024-02-17 DIAGNOSIS — N401 Enlarged prostate with lower urinary tract symptoms: Secondary | ICD-10-CM | POA: Diagnosis not present

## 2024-02-21 DIAGNOSIS — M48061 Spinal stenosis, lumbar region without neurogenic claudication: Secondary | ICD-10-CM | POA: Diagnosis not present

## 2024-02-21 DIAGNOSIS — M48062 Spinal stenosis, lumbar region with neurogenic claudication: Secondary | ICD-10-CM | POA: Diagnosis not present

## 2024-02-21 DIAGNOSIS — Z981 Arthrodesis status: Secondary | ICD-10-CM | POA: Diagnosis not present

## 2024-02-21 DIAGNOSIS — M5416 Radiculopathy, lumbar region: Secondary | ICD-10-CM | POA: Diagnosis not present

## 2024-03-26 DIAGNOSIS — Z9889 Other specified postprocedural states: Secondary | ICD-10-CM | POA: Diagnosis not present

## 2024-03-30 DIAGNOSIS — H2513 Age-related nuclear cataract, bilateral: Secondary | ICD-10-CM | POA: Diagnosis not present

## 2024-04-11 ENCOUNTER — Other Ambulatory Visit: Payer: Self-pay | Admitting: Cardiovascular Disease

## 2024-04-16 ENCOUNTER — Other Ambulatory Visit: Payer: Self-pay | Admitting: Cardiovascular Disease
